# Patient Record
Sex: Female | Born: 1945 | Race: White | Hispanic: No | Smoking: Never smoker
Health system: Southern US, Community
[De-identification: ages and names within clinical notes are randomized; demographics above are authoritative.]

## PROBLEM LIST (undated history)

## (undated) DIAGNOSIS — Z9889 Other specified postprocedural states: Secondary | ICD-10-CM

## (undated) DIAGNOSIS — Z85820 Personal history of malignant melanoma of skin: Secondary | ICD-10-CM

## (undated) DIAGNOSIS — K649 Unspecified hemorrhoids: Secondary | ICD-10-CM

## (undated) DIAGNOSIS — G4730 Sleep apnea, unspecified: Secondary | ICD-10-CM

## (undated) DIAGNOSIS — Z91018 Allergy to other foods: Secondary | ICD-10-CM

## (undated) DIAGNOSIS — K219 Gastro-esophageal reflux disease without esophagitis: Secondary | ICD-10-CM

## (undated) DIAGNOSIS — F419 Anxiety disorder, unspecified: Secondary | ICD-10-CM

## (undated) DIAGNOSIS — Z859 Personal history of malignant neoplasm, unspecified: Secondary | ICD-10-CM

## (undated) DIAGNOSIS — K635 Polyp of colon: Secondary | ICD-10-CM

## (undated) DIAGNOSIS — N390 Urinary tract infection, site not specified: Secondary | ICD-10-CM

## (undated) DIAGNOSIS — Z85828 Personal history of other malignant neoplasm of skin: Secondary | ICD-10-CM

## (undated) DIAGNOSIS — A6920 Lyme disease, unspecified: Secondary | ICD-10-CM

## (undated) DIAGNOSIS — M1990 Unspecified osteoarthritis, unspecified site: Secondary | ICD-10-CM

## (undated) DIAGNOSIS — H269 Unspecified cataract: Secondary | ICD-10-CM

## (undated) DIAGNOSIS — G709 Myoneural disorder, unspecified: Secondary | ICD-10-CM

## (undated) DIAGNOSIS — I10 Essential (primary) hypertension: Secondary | ICD-10-CM

## (undated) DIAGNOSIS — I739 Peripheral vascular disease, unspecified: Secondary | ICD-10-CM

## (undated) DIAGNOSIS — I779 Disorder of arteries and arterioles, unspecified: Secondary | ICD-10-CM

## (undated) DIAGNOSIS — T7840XA Allergy, unspecified, initial encounter: Secondary | ICD-10-CM

## (undated) DIAGNOSIS — IMO0002 Reserved for concepts with insufficient information to code with codable children: Secondary | ICD-10-CM

## (undated) DIAGNOSIS — I471 Supraventricular tachycardia: Secondary | ICD-10-CM

## (undated) DIAGNOSIS — K589 Irritable bowel syndrome without diarrhea: Secondary | ICD-10-CM

## (undated) DIAGNOSIS — C801 Malignant (primary) neoplasm, unspecified: Secondary | ICD-10-CM

## (undated) DIAGNOSIS — N870 Mild cervical dysplasia: Secondary | ICD-10-CM

## (undated) DIAGNOSIS — Z8679 Personal history of other diseases of the circulatory system: Secondary | ICD-10-CM

## (undated) DIAGNOSIS — Z8719 Personal history of other diseases of the digestive system: Secondary | ICD-10-CM

## (undated) HISTORY — PX: COLON SURGERY: SHX602

## (undated) HISTORY — PX: TRANSTHORACIC ECHOCARDIOGRAM: SHX275

## (undated) HISTORY — PX: TUBAL LIGATION: SHX77

## (undated) HISTORY — PX: CATARACT EXTRACTION, BILATERAL: SHX1313

## (undated) HISTORY — PX: COLONOSCOPY: SHX174

## (undated) HISTORY — PX: EYE SURGERY: SHX253

## (undated) HISTORY — DX: Allergy to other foods: Z91.018

## (undated) HISTORY — PX: APPENDECTOMY: SHX54

## (undated) HISTORY — PX: UPPER GI ENDOSCOPY: SHX6162

## (undated) HISTORY — PX: COLPOSCOPY: SHX161

## (undated) HISTORY — DX: Mild cervical dysplasia: N87.0

## (undated) HISTORY — PX: CERVICAL BIOPSY  W/ LOOP ELECTRODE EXCISION: SUR135

## (undated) HISTORY — PX: OTHER SURGICAL HISTORY: SHX169

## (undated) HISTORY — DX: Allergy, unspecified, initial encounter: T78.40XA

## (undated) HISTORY — DX: Myoneural disorder, unspecified: G70.9

## (undated) HISTORY — PX: LAPAROSCOPIC CHOLECYSTECTOMY: SUR755

## (undated) HISTORY — DX: Gastro-esophageal reflux disease without esophagitis: K21.9

## (undated) HISTORY — DX: Polyp of colon: K63.5

---

## 1994-04-01 HISTORY — PX: CARDIAC ELECTROPHYSIOLOGY STUDY AND ABLATION: SHX1294

## 1995-04-02 HISTORY — PX: CARDIAC ELECTROPHYSIOLOGY STUDY AND ABLATION: SHX1294

## 1997-04-01 HISTORY — PX: BREAST SURGERY: SHX581

## 1998-12-19 ENCOUNTER — Ambulatory Visit (HOSPITAL_BASED_OUTPATIENT_CLINIC_OR_DEPARTMENT_OTHER): Admission: RE | Admit: 1998-12-19 | Discharge: 1998-12-19 | Payer: Self-pay

## 1998-12-20 ENCOUNTER — Other Ambulatory Visit: Admission: RE | Admit: 1998-12-20 | Discharge: 1998-12-20 | Payer: Self-pay | Admitting: Obstetrics and Gynecology

## 1998-12-20 ENCOUNTER — Encounter (INDEPENDENT_AMBULATORY_CARE_PROVIDER_SITE_OTHER): Payer: Self-pay | Admitting: Specialist

## 1999-08-15 ENCOUNTER — Other Ambulatory Visit: Admission: RE | Admit: 1999-08-15 | Discharge: 1999-08-15 | Payer: Self-pay | Admitting: Obstetrics and Gynecology

## 2000-11-27 ENCOUNTER — Other Ambulatory Visit: Admission: RE | Admit: 2000-11-27 | Discharge: 2000-11-27 | Payer: Self-pay | Admitting: Obstetrics and Gynecology

## 2001-12-16 ENCOUNTER — Other Ambulatory Visit: Admission: RE | Admit: 2001-12-16 | Discharge: 2001-12-16 | Payer: Self-pay | Admitting: Obstetrics and Gynecology

## 2003-04-02 DIAGNOSIS — A6920 Lyme disease, unspecified: Secondary | ICD-10-CM

## 2003-04-02 HISTORY — DX: Lyme disease, unspecified: A69.20

## 2003-08-10 ENCOUNTER — Other Ambulatory Visit: Admission: RE | Admit: 2003-08-10 | Discharge: 2003-08-10 | Payer: Self-pay | Admitting: Obstetrics and Gynecology

## 2004-01-31 ENCOUNTER — Other Ambulatory Visit: Admission: RE | Admit: 2004-01-31 | Discharge: 2004-01-31 | Payer: Self-pay | Admitting: Obstetrics and Gynecology

## 2004-08-10 ENCOUNTER — Other Ambulatory Visit: Admission: RE | Admit: 2004-08-10 | Discharge: 2004-08-10 | Payer: Self-pay | Admitting: Obstetrics and Gynecology

## 2005-05-16 ENCOUNTER — Ambulatory Visit (HOSPITAL_COMMUNITY): Admission: EM | Admit: 2005-05-16 | Discharge: 2005-05-17 | Payer: Self-pay | Admitting: Emergency Medicine

## 2005-05-16 ENCOUNTER — Encounter (INDEPENDENT_AMBULATORY_CARE_PROVIDER_SITE_OTHER): Payer: Self-pay | Admitting: Specialist

## 2005-12-11 ENCOUNTER — Other Ambulatory Visit: Admission: RE | Admit: 2005-12-11 | Discharge: 2005-12-11 | Payer: Self-pay | Admitting: Obstetrics and Gynecology

## 2006-09-23 ENCOUNTER — Other Ambulatory Visit: Admission: RE | Admit: 2006-09-23 | Discharge: 2006-09-23 | Payer: Self-pay | Admitting: Obstetrics and Gynecology

## 2007-02-18 ENCOUNTER — Other Ambulatory Visit: Admission: RE | Admit: 2007-02-18 | Discharge: 2007-02-18 | Payer: Self-pay | Admitting: Obstetrics and Gynecology

## 2008-02-24 ENCOUNTER — Ambulatory Visit: Payer: Self-pay | Admitting: Obstetrics and Gynecology

## 2008-02-24 ENCOUNTER — Other Ambulatory Visit: Admission: RE | Admit: 2008-02-24 | Discharge: 2008-02-24 | Payer: Self-pay | Admitting: Obstetrics and Gynecology

## 2008-02-24 ENCOUNTER — Encounter: Payer: Self-pay | Admitting: Obstetrics and Gynecology

## 2008-09-19 ENCOUNTER — Ambulatory Visit: Payer: Self-pay | Admitting: Obstetrics and Gynecology

## 2009-03-15 ENCOUNTER — Other Ambulatory Visit: Admission: RE | Admit: 2009-03-15 | Discharge: 2009-03-15 | Payer: Self-pay | Admitting: Obstetrics and Gynecology

## 2009-03-15 ENCOUNTER — Ambulatory Visit: Payer: Self-pay | Admitting: Obstetrics and Gynecology

## 2009-12-08 ENCOUNTER — Ambulatory Visit: Payer: Self-pay | Admitting: Gynecology

## 2010-01-08 ENCOUNTER — Ambulatory Visit: Payer: Self-pay | Admitting: Obstetrics and Gynecology

## 2010-01-17 ENCOUNTER — Encounter: Admission: RE | Admit: 2010-01-17 | Discharge: 2010-01-17 | Payer: Self-pay | Admitting: Gastroenterology

## 2010-05-29 ENCOUNTER — Other Ambulatory Visit (HOSPITAL_COMMUNITY)
Admission: RE | Admit: 2010-05-29 | Discharge: 2010-05-29 | Disposition: A | Discharge status: Home / Self Care | Payer: PRIVATE HEALTH INSURANCE | Source: Ambulatory Visit | Attending: Obstetrics and Gynecology | Admitting: Obstetrics and Gynecology

## 2010-05-30 ENCOUNTER — Other Ambulatory Visit: Payer: Self-pay | Admitting: Obstetrics and Gynecology

## 2010-05-30 ENCOUNTER — Encounter (INDEPENDENT_AMBULATORY_CARE_PROVIDER_SITE_OTHER): Payer: PRIVATE HEALTH INSURANCE | Admitting: Obstetrics and Gynecology

## 2010-05-30 DIAGNOSIS — Z01419 Encounter for gynecological examination (general) (routine) without abnormal findings: Secondary | ICD-10-CM

## 2010-05-30 DIAGNOSIS — Z124 Encounter for screening for malignant neoplasm of cervix: Secondary | ICD-10-CM | POA: Insufficient documentation

## 2010-05-30 DIAGNOSIS — R82998 Other abnormal findings in urine: Secondary | ICD-10-CM

## 2010-05-30 DIAGNOSIS — Z13220 Encounter for screening for lipoid disorders: Secondary | ICD-10-CM

## 2010-05-30 DIAGNOSIS — Z833 Family history of diabetes mellitus: Secondary | ICD-10-CM

## 2010-08-17 NOTE — Op Note (Signed)
Anna [name], Anna [name]                  ACCOUNT NO.:  [id number]   MEDICAL RECORD NO.:  [id number]          PATIENT TYPE:  INP   LOCATION:  0098                         FACILITY:  [facility]   PHYSICIAN:  Clovis Pu. Cornett, M.D.DATE OF BIRTH:  15-Jan-1946   DATE OF PROCEDURE:  05/16/2005  DATE OF DISCHARGE:                                 OPERATIVE REPORT   PREOPERATIVE DIAGNOSIS:  Acute cholecystitis.   POSTOPERATIVE DIAGNOSIS:  Acute cholecystitis.   PROCEDURE:  Laparoscopic cholecystectomy with intraoperative cholangiogram.   SURGEON:  Dr. Harriette Bouillon.   ASSISTANT:  Dr. Cyndia Bent.   ANESTHESIA:  General endotracheal anesthesia with 8 mL of 0.25% Sensorcaine.   ESTIMATED BLOOD LOSS:  20 mL.   SPECIMEN:  Gallbladder to pathology for evaluation with gallstones.   INDICATIONS FOR PROCEDURE:  The patient is a 65 year old female progressive  right upper quadrant pain. She had swelling of her gallbladder and stones  noted by CT scan workup by her gynecologist. She was sent to me in the  emergency room to evaluate I felt she had acute cholecystitis by her  physical examination, clinical history and by reviewing her CT report. After  discussion of the procedure which included laparoscopic cholecystectomy with  intraoperative cholangiogram, she agreed to proceed. Informed consent was  obtained for discussion of the potential complications of the procedure.   DESCRIPTION OF PROCEDURE:  The patient was brought to the operating room and  placed supine. After induction of general endotracheal anesthesia, the  abdomen was prepped and draped in a sterile fashion. A 1 cm supraumbilical  incision was made, dissection was carried to her fascia and her fascia was  grasped with a Kocher clamp. A small incision was made and both sides of the  fascia were grasped in between Kocher's. I used my finger to enter the  abdominal cavity by pushing through the peritoneum. A pursestring suture of  #0  Vicryl was placed and an 11-mm Hassan cannula was placed under direct  vision. Pneumoperitoneum was created to 15 mmHg with CO2 and a laparoscope  was placed. The patient was placed in reverse Trendelenburg and rolled to  her left. The gallbladder was identified and found to be acutely inflamed.  The appendix was identified and found to be normal. Next, a 5 mm subxiphoid  port was placed under direct vision. Two 5 mm ports were placed in the right  mid abdomen under direct vision. The gallbladder was grasped by its dome and  retracted toward the patient's right shoulder. A second grasper was used to  grasp the infundibulum. I began my dissection at the junction the cystic  duct and cystic artery and dissected out the cystic artery circumferentially  as the only tubular structuring entering the gallbladder. A clip was placed  on the gallbladder side of the cystic duct and a small incision was made for  intraoperative cholangiogram. Through a separate stab incision, a Cook  cholangiogram catheter was placed. This was placed in the cystic duct and  secured with a clip. Intraoperative cholangiogram was performed using one-  half strength Hypaque  dye. Fluoroscopy was used. The cystic duct opacified  with free flow of contrast down the common duct into the duodenum. There was  free flow of contrast up the common hepatic duct to its bifurcation and  subsegments. No evidence of stone, stricture or extravasation of contrast.  At this point, the cholangiogram was complete. The cystic duct was double  clipped and divided. The cystic artery was identified and this was double  clipped and divided. There was a small posterior branch at the cystic artery  that was double clipped and divided as well. Hook cautery was used to  dissect the gallbladder from the gallbladder fossa. A small hole was made in  the gallbladder but no stones were spilled. The remainder of the dissection  went without difficulty.  Using a 5 mm camera, I extracted the gallbladder  with an EndoCatch bag through the umbilicus. We then inspected the bed and  found it to be hemostatic. There was two small areas of oozing that I  controlled with cautery. Irrigation was used and suctioned out. Hemostasis  at this point is excellent. The ports were subsequently withdrawn under  direct vision with no signs of bowel injury or liver or other intra-  abdominal organ injury. Prior to extracting the camera, I examined the  cystic duct as well as cystic artery stumps and these were found be  hemostatic with no leakage of bile. The ports were subsequently removed  under direct vision with no port site bleeding. The camera was withdrawn and  the umbilical port was withdrawn as well and the CO2 was released. I closed  the umbilical port site with a pursestring suture of #0 Vicryl, 4-0 Monocryl  was used to close the skin. Dry dressings were applied to all incisions  after removal of ports. All final counts of sponge, needle and instruments  were found to be correct at this portion of the case. The patient was then  awoke and taken to recovery in satisfactory condition.      Thomas A. Cornett, M.D.  Electronically Signed     TAC/MEDQ  D:  05/16/2005  T:  05/17/2005  Job:  [id number]

## 2010-08-17 NOTE — H&P (Signed)
Anna [name], Anna [name]                  ACCOUNT NO.:  [id number]   MEDICAL RECORD NO.:  [id number]          PATIENT TYPE:  INP   LOCATION:  0098                         FACILITY:  [facility]   PHYSICIAN:  Clovis Pu. Cornett, M.D.DATE OF BIRTH:  05-19-1945   DATE OF ADMISSION:  05/16/2005  DATE OF DISCHARGE:                                HISTORY & PHYSICAL   CHIEF COMPLAINT:  Abdominal pain.   HISTORY OF PRESENT ILLNESS:  The patient is a 65 year old female sent to the  emergency room today due to right upper quadrant abdominal pain. I was asked  to see her at the request of her gynecologist today. She complains of severe  right upper quadrant pain radiating to her right shoulder. This started this  morning about 2 o'clock and has been constant. It has not gotten better and  is made worse by laying flat. The pain is severe in nature. It is a sharp  pain. Denies any shortness of breath. Denies any chest pain. No burning when  she urinates or nausea and vomiting.   PAST MEDICAL HISTORY:  1.  History of SVT, status post ablation in 1996.  2.  Anxiety.   PAST SURGICAL HISTORY:  Tubal ligation, laparoscopic.   SOCIAL HISTORY:  Denies tobacco or smoking use.   ALLERGIES:  SULFA and a Z-PAK.   MEDICATIONS:  1.  Wellbutrin 300 milligrams once a day.  2.  Lexapro 10 milligrams a day.  3.  Xanax 1 milligram twice a day.   REVIEW OF SYSTEMS:  As stated above, otherwise 10-review of systems is  negative.   FAMILY HISTORY:  Noncontributory.   PHYSICAL EXAMINATION:  VITAL SIGNS: Temperature is 98.4, pulse 85,  respiratory rate 18.  GENERAL APPEARANCE: A pleasant female in no apparent distress.  HEENT: No scleral icterus. Oropharynx clear. Moist mucus membranes.  NECK: Supple and nontender.  CHEST: Clear to auscultation bilaterally.  CARDIOVASCULAR: Regular rate and rhythm without murmur, rub, or gallop.  ABDOMEN: Positive rebound and guarding over the right upper quadrant.  Positive Murphy's  sign. No mass.  EXTREMITIES: No edema.  NEUROLOGICAL: Motor and sensory function are grossly intact. Gait is normal.  PSYCHIATRIC: Mood is normal and pleasant.   DIAGNOSTIC STUDIES:  CT scan reveals thickening of the gallbladder with  cholelithiasis. She has a white count of 9000, hemoglobin 12.6, platelet  count is 229,000. EKG and CMET is pending.   IMPRESSION:  Acute cholecystitis.   PLAN:  I recommend a laparoscopic cholecystectomy with intraoperative  cholangiogram this evening. We will go ahead and try to proceed with this  since she is in quite a bit of pain. I have discussed the procedure as well  as the potential complications of its long-term outcome and risk with the  patient. She voices understanding and agrees to proceed.      Thomas A. Cornett, M.D.  Electronically Signed     TAC/MEDQ  D:  05/16/2005  T:  05/17/2005  Job:  [id number]

## 2010-11-01 ENCOUNTER — Other Ambulatory Visit: Payer: Self-pay | Admitting: Obstetrics and Gynecology

## 2010-11-01 DIAGNOSIS — F419 Anxiety disorder, unspecified: Secondary | ICD-10-CM

## 2010-11-01 NOTE — Telephone Encounter (Signed)
PT. WENT TO [place] June [id number] & REFILLED XANAX WHILE THERE & CAN NOT TRANSFER THE ADDITIONAL REFILLS BACK TO HER [facility] HERE. I CONFIRMED THIS WITH CHRISTIAN @ HER [facility] HERE. CAN SHE GET MORE REFILLS CALLED TO THEM?

## 2010-11-01 NOTE — Telephone Encounter (Signed)
PT. NOTIFIED RX REFILLED PER DR. GOTTSEGEN & I CALLED IN RX.

## 2010-11-26 ENCOUNTER — Encounter: Payer: Self-pay | Admitting: Obstetrics and Gynecology

## 2010-11-26 ENCOUNTER — Ambulatory Visit (INDEPENDENT_AMBULATORY_CARE_PROVIDER_SITE_OTHER): Payer: PRIVATE HEALTH INSURANCE | Admitting: Obstetrics and Gynecology

## 2010-11-26 DIAGNOSIS — N950 Postmenopausal bleeding: Secondary | ICD-10-CM

## 2010-11-26 NOTE — Progress Notes (Signed)
The patient came to see me with a 2 day history of vaginal spotting and pelvic cramping. She is no longer on HRT. This has never happened to her before.  Abdomen is soft without guarding rebound or masses. External: Within normal limits, BUS: Within normal limits ,vaginal exam: Within normal limits,cervix: Clean but blood coming out of the os. Uterus: Normal size and shape. Adnexa: No masses.  Assessment: Postmenopausal bleeding  Plan: Endometrial biopsy done. SIH scheduled.

## 2010-12-05 ENCOUNTER — Telehealth: Payer: Self-pay

## 2010-12-05 NOTE — Telephone Encounter (Signed)
yes

## 2010-12-05 NOTE — Telephone Encounter (Signed)
DOES SHE STILL KEEP APPT FOR SONOHYSTOGRAM OF 12/12/10 AFTER SHE GETS THE BIOPSY RESULTS FROM YOU?

## 2010-12-06 ENCOUNTER — Telehealth: Payer: Self-pay

## 2010-12-06 NOTE — Telephone Encounter (Signed)
A CT of the abdomen and the pelvis was not do the same information as an SIH. I doubt yeast infections cause the bleeding. A CA 125 is not recommended unless a mass is seen on ultrasound.

## 2010-12-06 NOTE — Telephone Encounter (Signed)
PT. HAS A VERY HIGH DEDUCTIBLE AND WILL HAVE TO PAY OUT-OF-POCKET FOR 12-12-10 SHGM. HAD CT OF ABD. & PELVIS WITH DR. Madilyn Fireman 01-17-10. IT IS IN EPIC UNDER IMAGING TAB. WANTS TO KNOW IF ALL WAS FINE THEN DOES SHE STILL NEED THE SHGM NOW? ALSO SHE WANTS TO KNOW IF VAGINAL BLEEDING COULD BE FROM PERSISTENT RECURRENT YEAST INFECTIONS? ALSO SOMEONE TOLD HER ABOUT GETTING A CA125 TEST. IS THIS AN OPTION FOR HER?

## 2010-12-06 NOTE — Telephone Encounter (Signed)
PT. NOTIFIED OF DR. G'S NOTE BELOW AND WILL STILL HAVE SHGM DONE.

## 2010-12-12 ENCOUNTER — Other Ambulatory Visit: Payer: PRIVATE HEALTH INSURANCE

## 2010-12-12 ENCOUNTER — Ambulatory Visit (INDEPENDENT_AMBULATORY_CARE_PROVIDER_SITE_OTHER): Payer: PRIVATE HEALTH INSURANCE | Admitting: Obstetrics and Gynecology

## 2010-12-12 DIAGNOSIS — N949 Unspecified condition associated with female genital organs and menstrual cycle: Secondary | ICD-10-CM

## 2010-12-12 DIAGNOSIS — N950 Postmenopausal bleeding: Secondary | ICD-10-CM

## 2010-12-12 DIAGNOSIS — N83339 Acquired atrophy of ovary and fallopian tube, unspecified side: Secondary | ICD-10-CM

## 2010-12-12 DIAGNOSIS — N840 Polyp of corpus uteri: Secondary | ICD-10-CM

## 2010-12-12 DIAGNOSIS — R1032 Left lower quadrant pain: Secondary | ICD-10-CM

## 2010-12-12 NOTE — Progress Notes (Signed)
The patient had had postmenopausal bleeding for 2 days. It is now stopped she still having menstrual cramping. She had a normal endometrial biopsy at her office last week. On ultrasound today she has a retroflexed uterus with a prominent echogenic endometrial cavity. The echo is 4.7 mm. There is one area in the myometrium it looks like adenomyosis with the cystic area. Both ovaries are atrophic without masses. Cul-de-sac is free of fluid. An SIH was done. There were 2 posterior wall defects off slightly less than 1 cm consistent with endometrial polyps.  Assessment: 1. Postmenopausal bleeding 2. Endometrial polyps 3. Menstrual cramping.  Plan: We discussed proceeding with a hysteroscopy D&C and excision of these polyps. We will do this at [facility]. Patient will call Olegario Messier with dates. I explained to the patient that this may not get rid of the menstrual cramping but probably will.

## 2011-01-24 ENCOUNTER — Telehealth: Payer: Self-pay

## 2011-01-24 NOTE — Telephone Encounter (Signed)
Patient was in mid Sept and Dr. Reece Agar had rec Hysteroscopy, D&C and the note said patient would call me with dates.  I have not heard from her so I called her today.  She said she is doing great. No spotting or bleeding since that last episode.  She said she will have surgery but just too busy now to do it. 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  She said another reason she feels comfortable waiting is Dr. Reece Agar did endo bx and it was normal and no bleeding since.  She said she will call me when she is ready to schedule.  Dr. Reece Agar- Do I need to continue to follow up with her?

## 2011-01-24 NOTE — Telephone Encounter (Signed)
no

## 2011-02-18 ENCOUNTER — Other Ambulatory Visit: Payer: Self-pay | Admitting: Obstetrics and Gynecology

## 2011-02-18 NOTE — Telephone Encounter (Signed)
RX CALLED IN

## 2011-03-13 ENCOUNTER — Telehealth: Payer: Self-pay | Admitting: *Deleted

## 2011-03-13 NOTE — Telephone Encounter (Signed)
Please call patient and informed Tamiflu is not to be given prophylactically. Tamiflu is used within 72 hours of symptoms. Encouraged to wash surfaces granddaughter has touched with lysol wipes like door handles, faucets, ect.

## 2011-03-13 NOTE — Telephone Encounter (Signed)
Pt called wanting to know if she could have Rx for Tamaflu. She was around her granddaughter which was recently diagnosed with the flu. She has no pcp, she had OV in sept. Please advise

## 2011-03-14 NOTE — Telephone Encounter (Signed)
Lm for pt to call

## 2011-03-27 NOTE — Telephone Encounter (Signed)
Pt never returned phone call 

## 2011-04-26 ENCOUNTER — Other Ambulatory Visit: Payer: Self-pay | Admitting: Obstetrics and Gynecology

## 2011-04-26 NOTE — Telephone Encounter (Signed)
Amy Rx was called in.

## 2011-05-24 ENCOUNTER — Other Ambulatory Visit: Payer: Self-pay | Admitting: *Deleted

## 2011-05-24 NOTE — Telephone Encounter (Signed)
give patient #30 with 2 refills.

## 2011-05-24 NOTE — Telephone Encounter (Signed)
Rx called in to pharmacy. 

## 2011-05-24 NOTE — Telephone Encounter (Signed)
Pt called requesting refill on xanax 0.5 mg 1 tablet by mouth twice daily as needed. Pt asked if she could as have a couple of refills. Please advise

## 2011-08-16 ENCOUNTER — Other Ambulatory Visit: Payer: Self-pay | Admitting: Obstetrics and Gynecology

## 2011-08-22 ENCOUNTER — Other Ambulatory Visit (HOSPITAL_COMMUNITY)
Admission: RE | Admit: 2011-08-22 | Discharge: 2011-08-22 | Disposition: A | Discharge status: Home / Self Care | Payer: No Typology Code available for payment source | Source: Ambulatory Visit | Attending: Obstetrics and Gynecology | Admitting: Obstetrics and Gynecology

## 2011-08-22 ENCOUNTER — Ambulatory Visit (INDEPENDENT_AMBULATORY_CARE_PROVIDER_SITE_OTHER): Payer: PRIVATE HEALTH INSURANCE | Admitting: Obstetrics and Gynecology

## 2011-08-22 ENCOUNTER — Encounter: Payer: Self-pay | Admitting: Obstetrics and Gynecology

## 2011-08-22 VITALS — BP 124/76 | Ht 67.5 in | Wt 183.0 lb

## 2011-08-22 DIAGNOSIS — C439 Malignant melanoma of skin, unspecified: Secondary | ICD-10-CM | POA: Insufficient documentation

## 2011-08-22 DIAGNOSIS — E559 Vitamin D deficiency, unspecified: Secondary | ICD-10-CM

## 2011-08-22 DIAGNOSIS — E7800 Pure hypercholesterolemia, unspecified: Secondary | ICD-10-CM

## 2011-08-22 DIAGNOSIS — Z01419 Encounter for gynecological examination (general) (routine) without abnormal findings: Secondary | ICD-10-CM

## 2011-08-22 DIAGNOSIS — Z1159 Encounter for screening for other viral diseases: Secondary | ICD-10-CM | POA: Insufficient documentation

## 2011-08-22 LAB — COMPREHENSIVE METABOLIC PANEL
ALT: 13 U/L (ref 0–35)
AST: 15 U/L (ref 0–37)
Albumin: 4.2 g/dL (ref 3.5–5.2)
Alkaline Phosphatase: 76 U/L (ref 39–117)
BUN: 15 mg/dL (ref 6–23)
CO2: 28 mEq/L (ref 19–32)
Calcium: 9.2 mg/dL (ref 8.4–10.5)
Chloride: 106 mEq/L (ref 96–112)
Creat: 0.83 mg/dL (ref 0.50–1.10)
Glucose, Bld: 78 mg/dL (ref 70–99)
Potassium: 4 mEq/L (ref 3.5–5.3)
Sodium: 141 mEq/L (ref 135–145)
Total Bilirubin: 0.4 mg/dL (ref 0.3–1.2)
Total Protein: 6.8 g/dL (ref 6.0–8.3)

## 2011-08-22 LAB — CBC WITH DIFFERENTIAL/PLATELET
Basophils Absolute: 0 10*3/uL (ref 0.0–0.1)
Basophils Relative: 0 % (ref 0–1)
Eosinophils Absolute: 0.2 10*3/uL (ref 0.0–0.7)
Eosinophils Relative: 2 % (ref 0–5)
HCT: 41.4 % (ref 36.0–46.0)
Hemoglobin: 13.7 g/dL (ref 12.0–15.0)
Lymphocytes Relative: 40 % (ref 12–46)
Lymphs Abs: 3.1 10*3/uL (ref 0.7–4.0)
MCH: 31 pg (ref 26.0–34.0)
MCHC: 33.1 g/dL (ref 30.0–36.0)
MCV: 93.7 fL (ref 78.0–100.0)
Monocytes Absolute: 0.3 10*3/uL (ref 0.1–1.0)
Monocytes Relative: 5 % (ref 3–12)
Neutro Abs: 4 10*3/uL (ref 1.7–7.7)
Neutrophils Relative %: 53 % (ref 43–77)
Platelets: 310 10*3/uL (ref 150–400)
RBC: 4.42 MIL/uL (ref 3.87–5.11)
RDW: 13.7 % (ref 11.5–15.5)
WBC: 7.6 10*3/uL (ref 4.0–10.5)

## 2011-08-22 LAB — LIPID PANEL
Cholesterol: 232 mg/dL — ABNORMAL HIGH (ref 0–200)
HDL: 54 mg/dL (ref >39)
LDL Cholesterol: 155 mg/dL — ABNORMAL HIGH (ref 0–99)
Total CHOL/HDL Ratio: 4.3 Ratio
Triglycerides: 114 mg/dL (ref <150)
VLDL: 23 mg/dL (ref 0–40)

## 2011-08-22 MED ORDER — ALPRAZOLAM 0.5 MG PO TABS: 0.5000 mg | ORAL_TABLET | Freq: Every evening | ORAL | Status: DC | PRN

## 2011-08-22 NOTE — Progress Notes (Signed)
Patient came back to see me today for her annual GYN exam. 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 She is having no vaginal bleeding. She is having no pelvic pain. She is not sexually active. She is due for her yearly mammogram. Patient has had 2 normal bone densities. When we last checked her lipid profile last year her total cholesterol was 273 with a triglyceride of 163 an LDL of 178. She has been working on her diet. We had asked her last year to see a cardiologist but she is so far not seen one. She did fast  Today.  Physical examination: Kennon Portela present. HEENT within normal limits. Neck: Thyroid not large. No masses. Supraclavicular nodes: not enlarged. Breasts: Examined in both sitting and lying  position. No skin changes and no masses. Abdomen: Soft no guarding rebound or masses or hernia. Pelvic: External: Within normal limits. BUS: Within normal limits. Vaginal:within normal limits. Good estrogen effect. No evidence of cystocele rectocele or enterocele. Cervix: clean. Uterus: Normal size and shape. Adnexa: No masses. Rectovaginal exam: Confirmatory and negative. Extremities: Within normal limits.  Assessment: #1. Elevated cholesterol #2. CIN status post leap in 2005  Plan: Lipid profile done. Pap with high risk HPV typing done. Hopefully high-risk HPV will be negative and we will screen  one more year and then go back to normal screening. Continue yearly mammograms.

## 2011-08-23 LAB — URINALYSIS W MICROSCOPIC + REFLEX CULTURE
Bacteria, UA: NONE SEEN
Bilirubin Urine: NEGATIVE
Casts: NONE SEEN
Crystals: NONE SEEN
Glucose, UA: NEGATIVE mg/dL
Hgb urine dipstick: NEGATIVE
Ketones, ur: NEGATIVE mg/dL
Nitrite: NEGATIVE
Protein, ur: NEGATIVE mg/dL
Specific Gravity, Urine: 1.02 (ref 1.005–1.030)
Urobilinogen, UA: 0.2 mg/dL (ref 0.0–1.0)
pH: 5 (ref 5.0–8.0)

## 2011-08-23 LAB — VITAMIN D 25 HYDROXY (VIT D DEFICIENCY, FRACTURES): Vit D, 25-Hydroxy: 28 ng/mL — ABNORMAL LOW (ref 30–89)

## 2011-08-23 NOTE — Progress Notes (Signed)
Addended by: Richardson Chiquito on: 08/23/2011 12:20 PM   Modules accepted: Orders

## 2011-08-24 LAB — URINE CULTURE
Colony Count: NO GROWTH
Organism ID, Bacteria: NO GROWTH

## 2011-12-04 ENCOUNTER — Ambulatory Visit: Payer: Self-pay | Admitting: Unknown Physician Specialty

## 2012-01-23 ENCOUNTER — Telehealth: Payer: Self-pay | Admitting: *Deleted

## 2012-01-23 NOTE — Telephone Encounter (Signed)
Follow up from 01/24/11 telephone encounter. Pt is ready to schedule surgery if you think she should still have it? Pt is not having any problems doing good, no bleeding or spotting at all. Please advise

## 2012-01-23 NOTE — Telephone Encounter (Signed)
Is patient having any bleeding? Even if she is not I think she should have the surgery. The diagnosis is endometrial polyps. The surgery is hysteroscopy, D&C. Please forward to [facility] to schedule surgery.

## 2012-01-24 NOTE — Telephone Encounter (Signed)
Surgery is scheduled for Th Nov 14 7:30am at [facility].  Patient instructed and pamphlet mailed to her.

## 2012-01-27 ENCOUNTER — Emergency Department: Payer: Self-pay | Admitting: Emergency Medicine

## 2012-01-27 LAB — COMPREHENSIVE METABOLIC PANEL
Albumin: 4.2 g/dL (ref 3.4–5.0)
Alkaline Phosphatase: 106 U/L (ref 50–136)
Anion Gap: 7 (ref 7–16)
BUN: 16 mg/dL (ref 7–18)
Bilirubin,Total: 0.3 mg/dL (ref 0.2–1.0)
Calcium, Total: 9.2 mg/dL (ref 8.5–10.1)
Chloride: 107 mmol/L (ref 98–107)
Co2: 30 mmol/L (ref 21–32)
Creatinine: 0.83 mg/dL (ref 0.60–1.30)
EGFR (African American): >60
EGFR (Non-African Amer.): >60
Glucose: 86 mg/dL (ref 65–99)
Osmolality: 287 (ref 275–301)
Potassium: 4.4 mmol/L (ref 3.5–5.1)
SGOT(AST): 25 U/L (ref 15–37)
SGPT (ALT): 32 U/L (ref 12–78)
Sodium: 144 mmol/L (ref 136–145)
Total Protein: 8.3 g/dL — ABNORMAL HIGH (ref 6.4–8.2)

## 2012-01-27 LAB — CBC
HCT: 43.3 % (ref 35.0–47.0)
HGB: 14.5 g/dL (ref 12.0–16.0)
MCH: 32.1 pg (ref 26.0–34.0)
MCHC: 33.6 g/dL (ref 32.0–36.0)
MCV: 96 fL (ref 80–100)
Platelet: 303 10*3/uL (ref 150–440)
RBC: 4.52 10*6/uL (ref 3.80–5.20)
RDW: 13.3 % (ref 11.5–14.5)
WBC: 7.9 10*3/uL (ref 3.6–11.0)

## 2012-01-27 LAB — URINALYSIS, COMPLETE
Bacteria: NONE SEEN
Bilirubin,UR: NEGATIVE
Blood: NEGATIVE
Glucose,UR: NEGATIVE mg/dL (ref 0–75)
Ketone: NEGATIVE
Nitrite: NEGATIVE
Ph: 5 (ref 4.5–8.0)
Protein: NEGATIVE
RBC,UR: <1 /HPF (ref 0–5)
Specific Gravity: 1.008 (ref 1.003–1.030)
Squamous Epithelial: <1
WBC UR: 4 /HPF (ref 0–5)

## 2012-02-07 ENCOUNTER — Encounter (HOSPITAL_BASED_OUTPATIENT_CLINIC_OR_DEPARTMENT_OTHER): Payer: Self-pay | Admitting: *Deleted

## 2012-02-07 NOTE — Progress Notes (Signed)
NPO AFTER MN. ARRIVES AT 0615. PT TO GET LAB WORK DONE Monday OR Tuesday BEFORE 1430. (02-10-2012 OR 02-11-2012). NEEDS EKG.

## 2012-02-10 LAB — CBC
HCT: 39.2 % (ref 36.0–46.0)
Hemoglobin: 13.6 g/dL (ref 12.0–15.0)
MCH: 32.6 pg (ref 26.0–34.0)
MCHC: 34.7 g/dL (ref 30.0–36.0)
MCV: 94 fL (ref 78.0–100.0)
Platelets: 303 10*3/uL (ref 150–400)
RBC: 4.17 MIL/uL (ref 3.87–5.11)
RDW: 12.9 % (ref 11.5–15.5)
WBC: 6.7 10*3/uL (ref 4.0–10.5)

## 2012-02-12 NOTE — H&P (Signed)
Chief complaint: Postmenopausal bleeding with endometrial polyps.  History of present illness: Patient is a 66 year old gravida 2 para 2 AB 0 who presented to the office with postmenopausal bleeding. Saline infusion histogram showed several endometrial polyps. Patient now enters the hospital for hysteroscopy and excision of endometrial polyps.  Past medical history, family history, social history, and review of systems in epic record and reviewed.  Physical examination: HEENT within normal limits. Neck: Thyroid not large. No masses. Supraclavicular nodes: not enlarged. Breasts: Examined in both sitting and lying  position. No skin changes and no masses. Abdomen: Soft no guarding rebound or masses or hernia. Pelvic: External: Within normal limits. BUS: Within normal limits. Vaginal:within normal limits. Good estrogen effect. No evidence of cystocele rectocele or enterocele. Cervix: clean. Uterus: Normal size and shape. Adnexa: No masses. Rectovaginal exam: Confirmatory and negative. Extremities: Within normal limits.  Assessment: #1. Endometrial polyps #2. Postmenopausal bleeding  Plan: Hysteroscopy, D&C

## 2012-02-12 NOTE — Anesthesia Preprocedure Evaluation (Addendum)
Anesthesia Evaluation  Patient identified by MRN, date of birth, ID band Patient awake    Reviewed: Allergy & Precautions, H&P , NPO status , Patient's Chart, lab work & pertinent test results, reviewed documented beta blocker date and time   Airway Mallampati: II TM Distance: >3 FB Neck ROM: Full    Dental No notable dental hx.    Pulmonary neg pulmonary ROS,  breath sounds clear to auscultation  Pulmonary exam normal       Cardiovascular Exercise Tolerance: Good negative cardio ROS  Rhythm:Regular Rate:Normal  H/O WPW s/p successful ablation in 1997   Neuro/Psych Anxiety negative neurological ROS  negative psych ROS   GI/Hepatic negative GI ROS, Neg liver ROS,   Endo/Other  negative endocrine ROS  Renal/GU negative Renal ROS  negative genitourinary   Musculoskeletal negative musculoskeletal ROS (+)   Abdominal   Peds negative pediatric ROS (+)  Hematology negative hematology ROS (+)   Anesthesia Other Findings   Reproductive/Obstetrics negative OB ROS                          Anesthesia Physical Anesthesia Plan  ASA: II  Anesthesia Plan: General   Post-op Pain Management:    Induction: Intravenous  Airway Management Planned: LMA  Additional Equipment:   Intra-op Plan:   Post-operative Plan: Extubation in OR  Informed Consent: I have reviewed the patients History and Physical, chart, labs and discussed the procedure including the risks, benefits and alternatives for the proposed anesthesia with the patient or authorized representative who has indicated his/her understanding and acceptance.   Dental advisory given  Plan Discussed with: CRNA  Anesthesia Plan Comments:         Anesthesia Quick Evaluation

## 2012-02-13 ENCOUNTER — Ambulatory Visit (HOSPITAL_BASED_OUTPATIENT_CLINIC_OR_DEPARTMENT_OTHER)
Admission: RE | Admit: 2012-02-13 | Discharge: 2012-02-13 | Disposition: A | Discharge status: Home / Self Care | Payer: Medicare Other | Source: Ambulatory Visit | Attending: Obstetrics and Gynecology | Admitting: Obstetrics and Gynecology

## 2012-02-13 ENCOUNTER — Encounter (HOSPITAL_BASED_OUTPATIENT_CLINIC_OR_DEPARTMENT_OTHER): Payer: Self-pay | Admitting: Anesthesiology

## 2012-02-13 ENCOUNTER — Encounter (HOSPITAL_BASED_OUTPATIENT_CLINIC_OR_DEPARTMENT_OTHER): Payer: Self-pay | Admitting: *Deleted

## 2012-02-13 ENCOUNTER — Ambulatory Visit (HOSPITAL_BASED_OUTPATIENT_CLINIC_OR_DEPARTMENT_OTHER): Payer: Medicare Other | Admitting: Anesthesiology

## 2012-02-13 ENCOUNTER — Encounter (HOSPITAL_BASED_OUTPATIENT_CLINIC_OR_DEPARTMENT_OTHER)
Admission: RE | Disposition: A | Discharge status: Home / Self Care | Payer: Self-pay | Source: Ambulatory Visit | Attending: Obstetrics and Gynecology

## 2012-02-13 DIAGNOSIS — N814 Uterovaginal prolapse, unspecified: Secondary | ICD-10-CM

## 2012-02-13 DIAGNOSIS — N950 Postmenopausal bleeding: Secondary | ICD-10-CM

## 2012-02-13 DIAGNOSIS — N840 Polyp of corpus uteri: Secondary | ICD-10-CM | POA: Insufficient documentation

## 2012-02-13 DIAGNOSIS — Z01812 Encounter for preprocedural laboratory examination: Secondary | ICD-10-CM | POA: Insufficient documentation

## 2012-02-13 HISTORY — PX: HYSTEROSCOPY WITH D & C: SHX1775

## 2012-02-13 HISTORY — DX: Anxiety disorder, unspecified: F41.9

## 2012-02-13 HISTORY — DX: Personal history of malignant melanoma of skin: Z98.890

## 2012-02-13 HISTORY — DX: Personal history of malignant melanoma of skin: Z85.820

## 2012-02-13 LAB — CBC
HCT: 36.7 % (ref 36.0–46.0)
Hemoglobin: 12.6 g/dL (ref 12.0–15.0)
MCH: 32.5 pg (ref 26.0–34.0)
MCHC: 34.3 g/dL (ref 30.0–36.0)
MCV: 94.6 fL (ref 78.0–100.0)
Platelets: 282 10*3/uL (ref 150–400)
RBC: 3.88 MIL/uL (ref 3.87–5.11)
RDW: 12.9 % (ref 11.5–15.5)
WBC: 7.3 10*3/uL (ref 4.0–10.5)

## 2012-02-13 SURGERY — DILATATION AND CURETTAGE /HYSTEROSCOPY
Anesthesia: General | Site: Uterus | Wound class: Clean Contaminated

## 2012-02-13 MED ORDER — ONDANSETRON HCL 4 MG/2ML IJ SOLN
INTRAMUSCULAR | Status: DC | PRN
  Administered 2012-02-13: 4 mg via INTRAVENOUS

## 2012-02-13 MED ORDER — LIDOCAINE HCL (CARDIAC) 20 MG/ML IV SOLN
INTRAVENOUS | Status: DC | PRN
  Administered 2012-02-13: 80 mg via INTRAVENOUS

## 2012-02-13 MED ORDER — GLYCINE 1.5 % IR SOLN
Status: DC | PRN
  Administered 2012-02-13: 3000 mL

## 2012-02-13 MED ORDER — MIDAZOLAM HCL 5 MG/5ML IJ SOLN
INTRAMUSCULAR | Status: DC | PRN
  Administered 2012-02-13: 2 mg via INTRAVENOUS

## 2012-02-13 MED ORDER — PROPOFOL 10 MG/ML IV BOLUS
INTRAVENOUS | Status: DC | PRN
  Administered 2012-02-13: 180 mg via INTRAVENOUS

## 2012-02-13 MED ORDER — FENTANYL CITRATE 0.05 MG/ML IJ SOLN
INTRAMUSCULAR | Status: DC | PRN
  Administered 2012-02-13 (×2): 50 ug via INTRAVENOUS

## 2012-02-13 MED ORDER — KETOROLAC TROMETHAMINE 30 MG/ML IJ SOLN
INTRAMUSCULAR | Status: DC | PRN
  Administered 2012-02-13: 30 mg via INTRAVENOUS

## 2012-02-13 MED ORDER — LACTATED RINGERS IV SOLN
INTRAVENOUS | Status: DC
  Administered 2012-02-13: 07:00:00 via INTRAVENOUS
  Filled 2012-02-13: qty 1000

## 2012-02-13 MED ORDER — FENTANYL CITRATE 0.05 MG/ML IJ SOLN
25.0000 ug | INTRAMUSCULAR | Status: DC | PRN
  Filled 2012-02-13: qty 1

## 2012-02-13 MED ORDER — PROMETHAZINE HCL 25 MG/ML IJ SOLN
6.2500 mg | INTRAMUSCULAR | Status: DC | PRN
  Filled 2012-02-13: qty 1

## 2012-02-13 MED ORDER — DEXAMETHASONE SODIUM PHOSPHATE 4 MG/ML IJ SOLN
INTRAMUSCULAR | Status: DC | PRN
  Administered 2012-02-13: 10 mg via INTRAVENOUS

## 2012-02-13 MED ORDER — ACETAMINOPHEN 10 MG/ML IV SOLN
INTRAVENOUS | Status: DC | PRN
  Administered 2012-02-13: 1000 mg via INTRAVENOUS

## 2012-02-13 MED ORDER — CEFAZOLIN SODIUM-DEXTROSE 2-3 GM-% IV SOLR
2.0000 g | INTRAVENOUS | Status: AC
  Administered 2012-02-13: 2 g via INTRAVENOUS
  Filled 2012-02-13: qty 50

## 2012-02-13 SURGICAL SUPPLY — 28 items
CANISTER SUCTION 2500CC (MISCELLANEOUS) ×2 IMPLANT
CATH ROBINSON RED A/P 16FR (CATHETERS) IMPLANT
CLOTH BEACON ORANGE TIMEOUT ST (SAFETY) ×2 IMPLANT
CORD ACTIVE DISPOSABLE (ELECTRODE) ×1
CORD ELECTRO ACTIVE DISP (ELECTRODE) ×1 IMPLANT
COVER TABLE BACK 60X90 (DRAPES) ×2 IMPLANT
DRAPE CAMERA CLOSED 9X96 (DRAPES) ×2 IMPLANT
DRAPE LG THREE QUARTER DISP (DRAPES) ×2 IMPLANT
DRESSING TELFA 8X3 (GAUZE/BANDAGES/DRESSINGS) ×2 IMPLANT
ELECT LOOP GYNE PRO 24FR (CUTTING LOOP) ×2
ELECT REM PT RETURN 9FT ADLT (ELECTROSURGICAL) ×2
ELECT VAPORTRODE GRVD BAR (ELECTRODE) IMPLANT
ELECTRODE LOOP GYNE PRO 24FR (CUTTING LOOP) ×1 IMPLANT
ELECTRODE REM PT RTRN 9FT ADLT (ELECTROSURGICAL) ×1 IMPLANT
GLOVE BIO SURGEON STRL SZ 6.5 (GLOVE) ×2 IMPLANT
GLOVE ECLIPSE 6.0 STRL STRAW (GLOVE) ×1 IMPLANT
GLOVE ECLIPSE 7.0 STRL STRAW (GLOVE) ×4 IMPLANT
GLOVE INDICATOR 7.5 STRL GRN (GLOVE) ×2 IMPLANT
GOWN PREVENTION PLUS LG XLONG (DISPOSABLE) ×2 IMPLANT
GOWN STRL NON-REIN LRG LVL3 (GOWN DISPOSABLE) ×2 IMPLANT
LEGGING LITHOTOMY PAIR STRL (DRAPES) ×2 IMPLANT
PACK BASIN DAY SURGERY FS (CUSTOM PROCEDURE TRAY) ×2 IMPLANT
PAD OB MATERNITY 4.3X12.25 (PERSONAL CARE ITEMS) ×2 IMPLANT
PAD PREP 24X48 CUFFED NSTRL (MISCELLANEOUS) ×2 IMPLANT
TOWEL OR 17X24 6PK STRL BLUE (TOWEL DISPOSABLE) ×4 IMPLANT
TRAY DSU PREP LF (CUSTOM PROCEDURE TRAY) ×2 IMPLANT
TUBING HYDROFLEX HYSTEROSCOPY (TUBING) ×2 IMPLANT
WATER STERILE IRR 500ML POUR (IV SOLUTION) ×2 IMPLANT

## 2012-02-13 NOTE — Transfer of Care (Signed)
Immediate Anesthesia Transfer of Care Note  Patient: Anna [name]  Procedure(s) Performed: Procedure(s) (LRB) with comments: DILATATION AND CURETTAGE /HYSTEROSCOPY (N/A) - deficit - 710  Patient Location: PACU  Anesthesia Type:General  Level of Consciousness: awake and oriented  Airway & Oxygen Therapy: Patient Spontanous Breathing and Patient connected to nasal cannula oxygen  Post-op Assessment: Report given to PACU RN  Post vital signs: Reviewed and stable  Complications: No apparent anesthesia complications

## 2012-02-13 NOTE — Interval H&P Note (Signed)
History and Physical Interval Note:  02/13/2012 7:05 AM  Anna [name]  has presented today for surgery, with the diagnosis of POST MENOPAUSAL BLEEDING  The various methods of treatment have been discussed with the patient and family. After consideration of risks, benefits and other options for treatment, the patient has consented to  Procedure(s) (LRB) with comments: DILATATION AND CURETTAGE /HYSTEROSCOPY (N/A) - WITH RESECTOSCOPE as a surgical intervention .  The patient's history has been reviewed, patient examined, no change in status, stable for surgery.  I have reviewed the patient's chart and labs.  Questions were answered to the patient's satisfaction.     Loriana Samad L

## 2012-02-13 NOTE — Anesthesia Procedure Notes (Signed)
Procedure Name: LMA Insertion Date/Time: 02/13/2012 7:25 AM Performed by: Maris Berger T Pre-anesthesia Checklist: Patient identified, Emergency Drugs available, Suction available and Patient being monitored Patient Re-evaluated:Patient Re-evaluated prior to inductionOxygen Delivery Method: Circle System Utilized Preoxygenation: Pre-oxygenation with 100% oxygen Intubation Type: IV induction Ventilation: Mask ventilation without difficulty LMA: LMA inserted LMA Size: 4.0 Number of attempts: 1 Placement Confirmation: positive ETCO2 Dental Injury: Teeth and Oropharynx as per pre-operative assessment  Comments: Gauze roll between teeth

## 2012-02-13 NOTE — Op Note (Signed)
Preoperative diagnoses: Postmenopausal bleeding, endometrial polyps Postoperative diagnoses: Same Operation: Hysteroscopy with excision of endometrial polyps Surgeon: Dr. Eda Paschal Anesthesia: Gen.  Findings: External and vaginal exam was within normal limits. Cervix is clean. Uterus is normal size and shape. Adnexa failed to reveal masses. At the time of hysteroscopy patient had 2 endometrial polyps approximately 1 cm each which came off the right uterine wall close to the tubal ostia. Other than these endometrial cavity and endocervical cavity were free of disease.  Procedure: After general anesthesia the patient was placed in the dorsolithotomy position and prepped and draped in the usual sterile manner. A single-tooth tenaculum was placed on  the anterior lip of the cervix. The cervix was progressively dilated to a #31 Pratt dilator. A hysteroscopic resectoscope was introduced under direct vision with the camera for magnification. 1 and 1/2% glycine was used to expand the uterine cavity. An excellent view was obtained. Initially the polyps were seen. They were resected with a 90 wire loop with appropriate Bovie settings. As they were being resected we started to notice a rapid fluid deficit. We could not see any evidence of uterine perforation. There was a slight hole at the top of his fundus but did not appear to extend through the myometrium. The surgery was finished when the fluid deficit went to 710 cc. The polyps were sent to pathology for tissue diagnosis. Estimated blood loss was minimal. She will be watched carefully in recovery because of the rapid fluid deficit. The patient left the operating room in satisfactory condition.

## 2012-02-13 NOTE — Anesthesia Postprocedure Evaluation (Signed)
  Anesthesia Post-op Note  Patient: Anna [name]  Procedure(s) Performed: Procedure(s) (LRB): DILATATION AND CURETTAGE /HYSTEROSCOPY (N/A)  Patient Location: PACU  Anesthesia Type: General  Level of Consciousness: awake and alert   Airway and Oxygen Therapy: Patient Spontanous Breathing  Post-op Pain: mild  Post-op Assessment: Post-op Vital signs reviewed, Patient's Cardiovascular Status Stable, Respiratory Function Stable, Patent Airway and No signs of Nausea or vomiting  Post-op Vital Signs: stable  Complications: No apparent anesthesia complications

## 2012-02-14 ENCOUNTER — Encounter (HOSPITAL_BASED_OUTPATIENT_CLINIC_OR_DEPARTMENT_OTHER): Payer: Self-pay | Admitting: Obstetrics and Gynecology

## 2012-02-20 ENCOUNTER — Encounter: Payer: Self-pay | Admitting: Obstetrics and Gynecology

## 2012-03-03 ENCOUNTER — Ambulatory Visit (INDEPENDENT_AMBULATORY_CARE_PROVIDER_SITE_OTHER): Payer: Medicare Other | Admitting: Obstetrics and Gynecology

## 2012-03-03 ENCOUNTER — Encounter: Payer: Self-pay | Admitting: Obstetrics and Gynecology

## 2012-03-03 DIAGNOSIS — Z23 Encounter for immunization: Secondary | ICD-10-CM

## 2012-03-03 DIAGNOSIS — N840 Polyp of corpus uteri: Secondary | ICD-10-CM

## 2012-03-03 NOTE — Progress Notes (Signed)
Patient came back today for a postoperative visit. She feels great since surgery. She had not had vaginal bleeding for a while. However she did have menstrual cramping which is disappeared with removal of her endometrial polyp. The pathology report was benign.  Exam: Kennon Portela present.Pelvic exam: External: Within normal limits. BUS within normal limits. Vaginal exam: Within normal limits. Cervix: Clean. Uterus: Within normal limits. Adnexa: Within normal limits. Rectovaginal exam: Within normal limits.   Assessment: Benign endometrial polyp  Plan: Patient reassured. Return In May for annual exam.

## 2012-03-03 NOTE — Patient Instructions (Signed)
Report new bleeding

## 2012-03-04 ENCOUNTER — Telehealth: Payer: Self-pay | Admitting: *Deleted

## 2012-03-04 NOTE — Telephone Encounter (Signed)
Give her 30 with 2 refills

## 2012-03-04 NOTE — Telephone Encounter (Signed)
Pt calling requesting refill on Xanax 0.5 mg tablets, she forgot to request this at OV on 03/03/12. Please advise

## 2012-03-04 NOTE — Telephone Encounter (Signed)
Rx called in to pharmacy. 

## 2012-03-05 ENCOUNTER — Other Ambulatory Visit: Payer: Self-pay | Admitting: Obstetrics and Gynecology

## 2012-03-05 NOTE — Telephone Encounter (Signed)
rx called in

## 2012-09-24 ENCOUNTER — Other Ambulatory Visit: Payer: Self-pay | Admitting: Obstetrics and Gynecology

## 2012-09-24 NOTE — Telephone Encounter (Signed)
Former patient of Dr. Reece Agar-. Last CE was 08/22/11. Dr. Reece Agar prescribed Xanax and wrote "at bedtime as needed for sleep".

## 2012-09-24 NOTE — Telephone Encounter (Signed)
Called into pharmacy

## 2012-11-02 ENCOUNTER — Encounter: Payer: Self-pay | Admitting: Obstetrics and Gynecology

## 2012-11-02 ENCOUNTER — Ambulatory Visit (INDEPENDENT_AMBULATORY_CARE_PROVIDER_SITE_OTHER): Payer: Medicare Other | Admitting: Gynecology

## 2012-11-02 ENCOUNTER — Encounter: Payer: Self-pay | Admitting: Gynecology

## 2012-11-02 VITALS — BP 124/70 | Ht 68.0 in | Wt 188.0 lb

## 2012-11-02 DIAGNOSIS — G4700 Insomnia, unspecified: Secondary | ICD-10-CM

## 2012-11-02 DIAGNOSIS — N952 Postmenopausal atrophic vaginitis: Secondary | ICD-10-CM

## 2012-11-02 DIAGNOSIS — Z124 Encounter for screening for malignant neoplasm of cervix: Secondary | ICD-10-CM

## 2012-11-02 DIAGNOSIS — C439 Malignant melanoma of skin, unspecified: Secondary | ICD-10-CM

## 2012-11-02 MED ORDER — ALPRAZOLAM 0.5 MG PO TABS: ORAL_TABLET | ORAL | Status: DC

## 2012-11-02 NOTE — Patient Instructions (Signed)
Follow up in one year, sooner as needed. 

## 2012-11-02 NOTE — Addendum Note (Signed)
Addended by: Dayna Barker on: 11/02/2012 02:07 PM   Modules accepted: Orders

## 2012-11-02 NOTE — Progress Notes (Signed)
Anna [name] 1945/11/04 [id number]        67 y.o.  [id number] for followup exam.  Former patient of Dr. Eda Paschal.  Past medical history,surgical history, medications, allergies, family history and social history were all reviewed and documented in the EPIC chart.  ROS:  Performed and pertinent positives and negatives are included in the history, assessment and plan .  Exam: Kim assistant Filed Vitals:   11/02/12 1208  BP: 124/70  Height: 5\' 8"  (1.727 m)  Weight: 188 lb (85.276 kg)   General appearance  Normal Skin grossly normal Head/Neck normal with no cervical or supraclavicular adenopathy thyroid normal Lungs  clear Cardiac RR, without RMG Abdominal  soft, nontender, without masses, organomegaly or hernia Breasts  examined lying and sitting without masses, retractions, discharge or axillary adenopathy. Pelvic  Ext/BUS/vagina  normal with atrophic changes.  Cervix  normal with atrophic and scarred changes  Uterus  anteverted, normal size, shape and contour, midline and mobile nontender   Adnexa  Without masses or tenderness    Anus and perineum  normal   Rectovaginal  normal sphincter tone without palpated masses or tenderness.    Assessment/Plan:  67 y.o. [id number] female for followup exam.   1. Postmenopausal/atrophic vaginitis. Without significant hot flashes night sweats vaginal dryness or dyspareunia. We'll continue observation. Report any vaginal bleeding. 2. Insomnia. Patient has a history of chronic insomnia for which she uses Xanax 0.5 mg when necessary for sleep. Had been given by Dr. Eda Paschal for years. I refilled her #[id number] 3. Pap smear 2013. Reviewed current screening guidelines. History of CIN-1 status post LEEP 2005 with normal Pap smears since. Options to stop screening altogether versus less frequent screening intervals discussed. The patient feels very strongly that she wants to continue with a Pap smear this year and asked me to do so and I did a Pap  smear. 4. Mammography today. Continue with annual mammography. SBE monthly reviewed. 5. History of melanoma. Vulvar exam today is normal. Actively sees her dermatologist. Continue with annual physician vulvar exams. 6. DEXA 2008 normal. History of prior normal DEXA before this. Recommend repeating at a 10 year interval. Increase calcium vitamin D reviewed. 7. Colonoscopy 2013. Repeat at their recommended interval. 8. Health maintenance. The blood work done. Is in the process of establishing care with a primary physician will follow up with them for routine healthcare and blood work. Followup for GYN exam in one year, sooner as needed.  Note: This document was prepared with digital dictation and possible smart phrase technology. Any transcriptional errors that result from this process are unintentional.   Dara Lords MD, 12:46 PM 11/02/2012

## 2012-11-03 ENCOUNTER — Encounter: Payer: Self-pay | Admitting: Gynecology

## 2012-11-03 LAB — URINALYSIS W MICROSCOPIC + REFLEX CULTURE
Bacteria, UA: NONE SEEN
Bilirubin Urine: NEGATIVE
Casts: NONE SEEN
Crystals: NONE SEEN
Glucose, UA: NEGATIVE mg/dL
Hgb urine dipstick: NEGATIVE
Ketones, ur: NEGATIVE mg/dL
Leukocytes, UA: NEGATIVE
Nitrite: NEGATIVE
Protein, ur: NEGATIVE mg/dL
Specific Gravity, Urine: 1.022 (ref 1.005–1.030)
Urobilinogen, UA: 0.2 mg/dL (ref 0.0–1.0)
pH: 5.5 (ref 5.0–8.0)

## 2012-12-09 ENCOUNTER — Observation Stay (HOSPITAL_COMMUNITY)
Admission: EM | Admit: 2012-12-09 | Discharge: 2012-12-10 | Disposition: A | Discharge status: Home / Self Care | Payer: Medicare Other | Attending: Internal Medicine | Admitting: Internal Medicine

## 2012-12-09 ENCOUNTER — Emergency Department (HOSPITAL_COMMUNITY): Payer: Medicare Other

## 2012-12-09 ENCOUNTER — Encounter (HOSPITAL_COMMUNITY): Payer: Self-pay

## 2012-12-09 DIAGNOSIS — H538 Other visual disturbances: Secondary | ICD-10-CM | POA: Diagnosis present

## 2012-12-09 DIAGNOSIS — F411 Generalized anxiety disorder: Secondary | ICD-10-CM | POA: Insufficient documentation

## 2012-12-09 DIAGNOSIS — R319 Hematuria, unspecified: Secondary | ICD-10-CM | POA: Diagnosis present

## 2012-12-09 DIAGNOSIS — R1011 Right upper quadrant pain: Secondary | ICD-10-CM | POA: Insufficient documentation

## 2012-12-09 DIAGNOSIS — R1012 Left upper quadrant pain: Secondary | ICD-10-CM | POA: Insufficient documentation

## 2012-12-09 DIAGNOSIS — R0789 Other chest pain: Principal | ICD-10-CM | POA: Insufficient documentation

## 2012-12-09 DIAGNOSIS — R079 Chest pain, unspecified: Secondary | ICD-10-CM | POA: Diagnosis present

## 2012-12-09 DIAGNOSIS — F410 Panic disorder [episodic paroxysmal anxiety] without agoraphobia: Secondary | ICD-10-CM | POA: Diagnosis present

## 2012-12-09 LAB — CBC WITH DIFFERENTIAL/PLATELET
Basophils Absolute: 0.1 10*3/uL (ref 0.0–0.1)
Basophils Relative: 1 % (ref 0–1)
Eosinophils Absolute: 0.1 10*3/uL (ref 0.0–0.7)
Eosinophils Relative: 1 % (ref 0–5)
HCT: 40 % (ref 36.0–46.0)
Hemoglobin: 14 g/dL (ref 12.0–15.0)
Lymphocytes Relative: 35 % (ref 12–46)
Lymphs Abs: 2.7 10*3/uL (ref 0.7–4.0)
MCH: 32.9 pg (ref 26.0–34.0)
MCHC: 35 g/dL (ref 30.0–36.0)
MCV: 93.9 fL (ref 78.0–100.0)
Monocytes Absolute: 0.6 10*3/uL (ref 0.1–1.0)
Monocytes Relative: 7 % (ref 3–12)
Neutro Abs: 4.5 10*3/uL (ref 1.7–7.7)
Neutrophils Relative %: 56 % (ref 43–77)
Platelets: 282 10*3/uL (ref 150–400)
RBC: 4.26 MIL/uL (ref 3.87–5.11)
RDW: 13.2 % (ref 11.5–15.5)
WBC: 7.9 10*3/uL (ref 4.0–10.5)

## 2012-12-09 LAB — COMPREHENSIVE METABOLIC PANEL
ALT: 14 U/L (ref 0–35)
AST: 18 U/L (ref 0–37)
Albumin: 3.9 g/dL (ref 3.5–5.2)
Alkaline Phosphatase: 86 U/L (ref 39–117)
BUN: 15 mg/dL (ref 6–23)
CO2: 25 mEq/L (ref 19–32)
Calcium: 9.4 mg/dL (ref 8.4–10.5)
Chloride: 106 mEq/L (ref 96–112)
Creatinine, Ser: 0.84 mg/dL (ref 0.50–1.10)
GFR calc Af Amer: 83 mL/min — ABNORMAL LOW (ref >90)
GFR calc non Af Amer: 71 mL/min — ABNORMAL LOW (ref >90)
Glucose, Bld: 95 mg/dL (ref 70–99)
Potassium: 3.8 mEq/L (ref 3.5–5.1)
Sodium: 140 mEq/L (ref 135–145)
Total Bilirubin: 0.1 mg/dL — ABNORMAL LOW (ref 0.3–1.2)
Total Protein: 7.5 g/dL (ref 6.0–8.3)

## 2012-12-09 LAB — POCT I-STAT TROPONIN I: Troponin i, poc: 0 ng/mL (ref 0.00–0.08)

## 2012-12-09 LAB — LIPASE, BLOOD: Lipase: 22 U/L (ref 11–59)

## 2012-12-09 MED ORDER — NITROGLYCERIN 0.4 MG SL SUBL
0.4000 mg | SUBLINGUAL_TABLET | SUBLINGUAL | Status: DC | PRN
  Administered 2012-12-09: 0.4 mg via SUBLINGUAL

## 2012-12-09 MED ORDER — ASPIRIN 81 MG PO CHEW
324.0000 mg | CHEWABLE_TABLET | Freq: Once | ORAL | Status: AC
  Administered 2012-12-09: 324 mg via ORAL
  Filled 2012-12-09: qty 4

## 2012-12-09 MED ORDER — ONDANSETRON HCL 4 MG/2ML IJ SOLN
4.0000 mg | Freq: Once | INTRAMUSCULAR | Status: DC
  Filled 2012-12-09: qty 2

## 2012-12-09 MED ORDER — MORPHINE SULFATE 4 MG/ML IJ SOLN
4.0000 mg | Freq: Once | INTRAMUSCULAR | Status: DC
  Filled 2012-12-09: qty 1

## 2012-12-09 NOTE — ED Provider Notes (Signed)
67 year old female who has a history of chronic low back pain on the left side and a recent history of hematuria. She had presented to her urology office today for imaging including a CT scan with contrast of her abdomen according to the patient's report (reports not visualized in the electronic medical record). She states that near the end of the procedure while she was in the CAT scan her she started to feel a pressure sensation in her lower chest and upper abdomen which has been relatively persistent but waxing and waning throughout the day. There is radiation left side, no nausea vomiting  As she was leaving the office while driving she developed acute onset of bilateral blurry vision. This is without eye pain and not associated with facial droop, slurred speech, weakness or numbness of the 4 extremities or difficulty ambulating. This has been persistent for over 12 hours at this point.  On recheck her VA is 20/50 bilaterally.  On my exam the patient has normal pupils, normal conjunctiva, visualized crisp optic discs.  She has normal appearing eyes, normal periorbital tissues. She has normal extraocular movements, normal cranial nerves III through XII, normal heart sounds, lung sounds and a benign abdominal exam without tenderness. There, here she clear speech and normal memory  Medical screening examination/treatment/procedure(s) were conducted as a shared visit with non-physician practitioner(s) and myself.  I personally evaluated the patient during the encounter.  Clinical Impression: CP, Blurred Vision      Vida Roller, MD 12/11/12 [phone]

## 2012-12-09 NOTE — ED Notes (Signed)
Pt c/o LUQ pain radiating to RUQ, dizziness, lightheaded, SOB and blurry vision starting after having her abdominal CT scan.

## 2012-12-09 NOTE — ED Provider Notes (Signed)
CSN: [id number]     Arrival date & time 12/09/12  2026 History   First MD Initiated Contact with Patient 12/09/12 2109     Chief Complaint  Patient presents with  . Abdominal Pain   (Consider location/radiation/quality/duration/timing/severity/associated sxs/prior Treatment) HPI  Anna [name] is a 67 y.o. female complaining of acute onset of LUQ pain this afternoon while she was in a CT scan at [facility] at 13:30. CT to evaluate hematuria x2weeks ago now resolved. Patient describes her pain as burning, located under the left breast rated 6/10 at worse 3/10 right now associated with diaphoresis, pain radiates to the right upper quadrant and to back. Patient also states that there is associated blurred vision which she is still having, onset as she was leaving [facility]. Patient received her CT with contrast. She has had contrast studies in the past with no adverse effects. Patient denies shortness of breath, nausea, vomiting, fever. Patient states that she has developed a dry cough since the pain started. She has  few cardiac risk factors other than advanced age and history of high cholesterol well controlled with diet alone. Patient has history of Wolff-Parkinson-White, affectively controlled with ablation in 96. She had a catheterization at that time and reported there is no tissues the catheterization, performed at [facility]. Patient denies any history of DVT, PE, recent mobilizations, long trips, calf pain or leg swelling, dysarthria, ataxia, unilateral weakness, slurred speech or facial asymmetry.    Past Medical History  Diagnosis Date  . History of melanoma excision RIGHT LEG  --  2012  . 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 Anxiety   . 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 Cervical biopsy  w/ loop electrode excision  2005  . Svt-cardiac ablation    . Breast surgery   1999    BREAST MASS EXCISED  . Tubal ligation    . Colposcopy    . Melanoma excised    . Laparoscopic cholecystectomy  05-16-2005  . Cardiac electrophysiology study and ablation  1997    SUCCESSFUL ABLATION OF WOLFE-PARKINSON-WHITE SYNDROME  (NO ISSUES SINCE)  . Hysteroscopy w/d&c  02/13/2012    Procedure: DILATATION AND CURETTAGE /HYSTEROSCOPY;  Surgeon: Trellis Paganini, MD;  Location: [facility];  Service: Gynecology;  Laterality: N/A;  deficit - 710  . Dilation and curettage of uterus    . Hysteroscopy     Family History  Problem Relation Age of Onset  . Cancer Mother     ESOPHAGEAL CANCER  . Diabetes Father   . Hyperlipidemia Father   . Heart disease Father   . Diabetes Brother   . Breast cancer Paternal Aunt     Age 56's  . Hyperlipidemia Paternal Grandmother   . Heart disease Paternal Grandmother   . Diabetes Paternal Grandfather   . Hyperlipidemia Paternal Grandfather   . Heart disease Paternal Grandfather    History  Substance Use Topics  . Smoking status: Never Smoker   . Smokeless tobacco: Never Used  . Alcohol Use: Yes     Comment: rare   OB History   Grav Para Term Preterm Abortions TAB SAB Ect Mult Living   2 2 2       2      Review of Systems 10 systems reviewed and found to be negative, except as noted in the  HPI   Allergies  Azithromycin and Sulfa antibiotics  Home Medications   Current Outpatient Rx  Name  Route  Sig  Dispense  Refill  . ALPRAZolam (XANAX) 0.5 MG tablet      TAKE 1 TABLET AT BEDTIME AS NEEDED   30 tablet   5   . Calcium Carbonate-Vitamin D (CALCIUM-D PO)   Oral   Take 1 tablet by mouth daily.          . Cholecalciferol (VITAMIN D) 2000 UNITS CAPS   Oral   Take 1 capsule by mouth daily.          . hydroxypropyl methylcellulose (ISOPTO TEARS) 2.5 % ophthalmic solution   Both Eyes   Place 1 drop into both eyes daily as needed. For dry eyes         . Multiple Vitamin (MULTIVITAMIN) capsule    Oral   Take 1 capsule by mouth daily.          . Omega-3 Fatty Acids (THE VERY FINEST FISH OIL) LIQD   Oral   Take by mouth daily. ONE TABLESPOON DAILY         . Probiotic Product (PROBIOTIC PO)   Oral   Take 2 capsules by mouth daily.          . sodium chloride (OCEAN) 0.65 % SOLN nasal spray   Nasal   Place 1 spray into the nose daily as needed for congestion.          BP 174/86  Pulse 92  Temp(Src) 97.8 F (36.6 C) (Oral)  Resp 16  Wt 181 lb (82.101 kg)  BMI 27.53 kg/m2  SpO2 99% Physical Exam  Nursing note and vitals reviewed. Constitutional: She is oriented to person, place, and time. She appears well-developed and well-nourished. No distress.  HENT:  Head: Normocephalic and atraumatic.  Mouth/Throat: Oropharynx is clear and moist.  Eyes: Conjunctivae and EOM are normal. Pupils are equal, round, and reactive to light.  OA 20/25 OS 20/50 OD 20/50  Bilateral funduscopic exam shows crisp optic discs  Neck: Normal range of motion. Neck supple. No JVD present.  Cardiovascular: Normal rate, regular rhythm and intact distal pulses.   Pulmonary/Chest: Effort normal and breath sounds normal. No stridor. No respiratory distress. She has no wheezes. She has no rales. She exhibits no tenderness.   pain is nonreproducible to palpation  Abdominal: Soft. Bowel sounds are normal. She exhibits no distension and no mass. There is tenderness. There is no rebound and no guarding.  Minimal tender to palpation diffusely, no guarding or rebound  Genitourinary:  No CVA tenderness bilaterally  Musculoskeletal: Normal range of motion. She exhibits no edema.  No calf asymmetry, superficial collaterals, palpable cords, edema, Homans sign negative bilaterally.    Neurological: She is alert and oriented to person, place, and time.  Cranial nerves III through XII intact, strength 5 out of 5x4 extremities, negative pronator drift, finger to nose and heel-to-shin coordinated, sensation  intact to pinprick and light touch, gait is coordinated and Romberg is negative.    Skin: Skin is warm.  Bandage to left upper chest the patient had basal cell carcinoma excised recently.  Psychiatric: She has a normal mood and affect.    ED Course  Procedures (including critical care time) Labs Review Labs Reviewed  COMPREHENSIVE METABOLIC PANEL - Abnormal; Notable for the following:    Total Bilirubin 0.1 (*)    GFR calc non Af Amer 71 (*)    GFR calc  Af Amer 83 (*)    All other components within normal limits  CBC WITH DIFFERENTIAL  LIPASE, BLOOD  D-DIMER, QUANTITATIVE  POCT I-STAT TROPONIN I  POCT I-STAT TROPONIN I   Imaging Review Dg Chest 2 View  12/09/2012   *RADIOLOGY REPORT*  Clinical Data: Chest pain under the left breast and radiating to the right side bending to the mid chest with shortness of breath. Pain occurred after a CT abdomen with contrast today.  CHEST - 2 VIEW  Comparison: 05/16/2005  Findings: Slightly shallow inspiration. The heart size and pulmonary vascularity are normal. The lungs appear clear and expanded without focal air space disease or consolidation. No blunting of the costophrenic angles.  No pneumothorax.  Mediastinal contours appear intact.  Degenerative changes in the thoracic spine.  Surgical clips in the right upper quadrant.  No significant changes since the previous study.  IMPRESSION: No evidence of active pulmonary disease.   Original Report Authenticated By: Burman Nieves, M.D.   Ct Head Wo Contrast  12/10/2012   *RADIOLOGY REPORT*  Clinical Data: Headache  CT HEAD WITHOUT CONTRAST  Technique:  Contiguous axial images were obtained from the base of the skull through the vertex without contrast.  Comparison: None.  Findings: The brain has a normal appearance without evidence for hemorrhage, infarction, hydrocephalus, or mass lesion.  There is no extra axial fluid collection.  The skull and paranasal sinuses are normal.  IMPRESSION:  Negative  exam.   Original Report Authenticated By: Signa Kell, M.D.    Date: 12/09/2012  Rate: 87  Rhythm: normal sinus rhythm with sinus arythmia  QRS Axis: normal  Intervals: normal  ST/T Wave abnormalities: nonspecific ST/T changes  Conduction Disutrbances:none  Narrative Interpretation:   Old EKG Reviewed: none available  MDM   1. Chest pain   2. Blurred vision, bilateral     Filed Vitals:   12/09/12 2130 12/09/12 2230 12/09/12 2245 12/09/12 2300  BP: 153/77 134/84 141/73 137/62  Pulse: 84 85 85 76  Temp:      TempSrc:      Resp: 22 20 13 15   Weight:      SpO2: 95% 97% 98% 98%     Anna [name] is a 67 y.o. female with left upper quadrant pain just under her ribs, described as burning, rated 6/10 at worst radiating to the right upper quadrant and back onset at 1330. Patient has no cardiac history, had a clean cath in 1996 as preop testing for ablation for worst Parkinson White, does not follow with the cardiologist.  Patient states nitroglycerin has not used her pain, she refuses morphine, we have compromised with acetaminophen.  Visual acuity is 20/50 in each individual aye and bilaterally. Patient states that visual changes started just after she [facility] roughly at 13:45. Protime the patient presented to the ED she was out of the TPA window. This is shared this with the attending physician Dr. Hyacinth Meeker was personally evaluated the patient, performed neurologic exam which is nonfocal.   Head CT is normal and d-dimer is negative which is reassuring against aortic dissection.  Patient will be admitted to a step down unit( as she is currently not chest pain free) for low-risk rule out under the care of Dr. Allena Katz.  Medications  nitroGLYCERIN (NITROSTAT) SL tablet 0.4 mg (0.4 mg Sublingual Given 12/09/12 2221)  morphine 4 MG/ML injection 4 mg (4 mg Intravenous Not Given 12/09/12 2317)  ondansetron (ZOFRAN) injection 4 mg (4 mg Intravenous Not Given  12/09/12 2317)  aspirin  chewable tablet 324 mg (324 mg Oral Given 12/09/12 2220)   Note: Portions of this report may have been transcribed using voice recognition software. Every effort was made to ensure accuracy; however, inadvertent computerized transcription errors may be present      Wynetta Emery, PA-C 12/10/12 0121

## 2012-12-09 NOTE — ED Notes (Signed)
Pt reports pain has improved since walking to restroom.

## 2012-12-10 ENCOUNTER — Emergency Department (HOSPITAL_COMMUNITY): Payer: Medicare Other

## 2012-12-10 DIAGNOSIS — H538 Other visual disturbances: Secondary | ICD-10-CM | POA: Diagnosis present

## 2012-12-10 DIAGNOSIS — R319 Hematuria, unspecified: Secondary | ICD-10-CM | POA: Diagnosis present

## 2012-12-10 DIAGNOSIS — H5310 Unspecified subjective visual disturbances: Secondary | ICD-10-CM

## 2012-12-10 DIAGNOSIS — F411 Generalized anxiety disorder: Secondary | ICD-10-CM

## 2012-12-10 DIAGNOSIS — R079 Chest pain, unspecified: Secondary | ICD-10-CM | POA: Diagnosis present

## 2012-12-10 DIAGNOSIS — F410 Panic disorder [episodic paroxysmal anxiety] without agoraphobia: Secondary | ICD-10-CM | POA: Diagnosis present

## 2012-12-10 DIAGNOSIS — R42 Dizziness and giddiness: Secondary | ICD-10-CM

## 2012-12-10 LAB — CBC WITH DIFFERENTIAL/PLATELET
Basophils Absolute: 0 10*3/uL (ref 0.0–0.1)
Basophils Relative: 1 % (ref 0–1)
Eosinophils Absolute: 0.1 10*3/uL (ref 0.0–0.7)
Eosinophils Relative: 1 % (ref 0–5)
HCT: 36.4 % (ref 36.0–46.0)
Hemoglobin: 12.8 g/dL (ref 12.0–15.0)
Lymphocytes Relative: 46 % (ref 12–46)
Lymphs Abs: 3 10*3/uL (ref 0.7–4.0)
MCH: 32.7 pg (ref 26.0–34.0)
MCHC: 35.2 g/dL (ref 30.0–36.0)
MCV: 93.1 fL (ref 78.0–100.0)
Monocytes Absolute: 0.4 10*3/uL (ref 0.1–1.0)
Monocytes Relative: 6 % (ref 3–12)
Neutro Abs: 3.1 10*3/uL (ref 1.7–7.7)
Neutrophils Relative %: 46 % (ref 43–77)
Platelets: 245 10*3/uL (ref 150–400)
RBC: 3.91 MIL/uL (ref 3.87–5.11)
RDW: 13.2 % (ref 11.5–15.5)
WBC: 6.6 10*3/uL (ref 4.0–10.5)

## 2012-12-10 LAB — COMPREHENSIVE METABOLIC PANEL
ALT: 10 U/L (ref 0–35)
AST: 14 U/L (ref 0–37)
Albumin: 3.5 g/dL (ref 3.5–5.2)
Alkaline Phosphatase: 75 U/L (ref 39–117)
BUN: 12 mg/dL (ref 6–23)
CO2: 24 mEq/L (ref 19–32)
Calcium: 9 mg/dL (ref 8.4–10.5)
Chloride: 108 mEq/L (ref 96–112)
Creatinine, Ser: 0.71 mg/dL (ref 0.50–1.10)
GFR calc Af Amer: >90 mL/min (ref >90)
GFR calc non Af Amer: 89 mL/min — ABNORMAL LOW (ref >90)
Glucose, Bld: 95 mg/dL (ref 70–99)
Potassium: 3.8 mEq/L (ref 3.5–5.1)
Sodium: 142 mEq/L (ref 135–145)
Total Bilirubin: 0.3 mg/dL (ref 0.3–1.2)
Total Protein: 6.6 g/dL (ref 6.0–8.3)

## 2012-12-10 LAB — TROPONIN I
Troponin I: <0.3 ng/mL (ref <0.30)
Troponin I: <0.3 ng/mL (ref <0.30)
Troponin I: <0.3 ng/mL (ref <0.30)
Troponin I: <0.3 ng/mL (ref <0.30)

## 2012-12-10 LAB — PROTIME-INR
INR: 1.03 (ref 0.00–1.49)
Prothrombin Time: 13.3 seconds (ref 11.6–15.2)

## 2012-12-10 LAB — LIPID PANEL
Cholesterol: 201 mg/dL — ABNORMAL HIGH (ref 0–200)
HDL: 47 mg/dL (ref >39)
LDL Cholesterol: 139 mg/dL — ABNORMAL HIGH (ref 0–99)
Total CHOL/HDL Ratio: 4.3 RATIO
Triglycerides: 77 mg/dL (ref <150)
VLDL: 15 mg/dL (ref 0–40)

## 2012-12-10 LAB — POCT I-STAT TROPONIN I: Troponin i, poc: 0 ng/mL (ref 0.00–0.08)

## 2012-12-10 LAB — D-DIMER, QUANTITATIVE: D-Dimer, Quant: 0.42 ug/mL-FEU (ref 0.00–0.48)

## 2012-12-10 LAB — MRSA PCR SCREENING: MRSA by PCR: NEGATIVE

## 2012-12-10 MED ORDER — ONDANSETRON HCL 4 MG/2ML IJ SOLN: 4.0000 mg | Freq: Three times a day (TID) | INTRAMUSCULAR | Status: AC | PRN

## 2012-12-10 MED ORDER — ASPIRIN EC 81 MG PO TBEC
81.0000 mg | DELAYED_RELEASE_TABLET | Freq: Every day | ORAL | Status: DC
  Administered 2012-12-10: 81 mg via ORAL
  Filled 2012-12-10: qty 1

## 2012-12-10 MED ORDER — ENOXAPARIN SODIUM 40 MG/0.4ML ~~LOC~~ SOLN
40.0000 mg | Freq: Every day | SUBCUTANEOUS | Status: DC
  Filled 2012-12-10 (×2): qty 0.4

## 2012-12-10 MED ORDER — ACETAMINOPHEN 325 MG PO TABS
650.0000 mg | ORAL_TABLET | ORAL | Status: DC | PRN
  Administered 2012-12-10: 650 mg via ORAL
  Filled 2012-12-10: qty 2

## 2012-12-10 MED ORDER — GI COCKTAIL ~~LOC~~
30.0000 mL | Freq: Four times a day (QID) | ORAL | Status: DC | PRN
  Filled 2012-12-10: qty 30

## 2012-12-10 MED ORDER — ENOXAPARIN SODIUM 40 MG/0.4ML ~~LOC~~ SOLN
40.0000 mg | Freq: Every day | SUBCUTANEOUS | Status: DC
  Administered 2012-12-10: 40 mg via SUBCUTANEOUS

## 2012-12-10 MED ORDER — NITROGLYCERIN 0.4 MG SL SUBL: 0.4000 mg | SUBLINGUAL_TABLET | SUBLINGUAL | Status: DC | PRN

## 2012-12-10 MED ORDER — ALPRAZOLAM 0.25 MG PO TABS
0.5000 mg | ORAL_TABLET | Freq: Once | ORAL | Status: AC
  Administered 2012-12-10: 0.5 mg via ORAL
  Filled 2012-12-10: qty 2

## 2012-12-10 MED ORDER — ALPRAZOLAM 0.25 MG PO TABS: 0.5000 mg | ORAL_TABLET | Freq: Every evening | ORAL | Status: DC | PRN

## 2012-12-10 MED ORDER — ONDANSETRON HCL 4 MG/2ML IJ SOLN: 4.0000 mg | Freq: Four times a day (QID) | INTRAMUSCULAR | Status: DC | PRN

## 2012-12-10 MED ORDER — MORPHINE SULFATE 2 MG/ML IJ SOLN: 2.0000 mg | INTRAMUSCULAR | Status: DC | PRN

## 2012-12-10 MED ORDER — HYDROMORPHONE HCL PF 1 MG/ML IJ SOLN: 1.0000 mg | INTRAMUSCULAR | Status: AC | PRN

## 2012-12-10 NOTE — H&P (Signed)
[facility] History and Physical  Patient: Anna [name]  MVH:[id number]  DOB: Jul 22, 1945  DOA: 12/09/2012  Referring physician: Wilson Singer PCP: No primary provider on file.   Chief Complaint: Left-sided abdominal and chest pain  HPI: Anna [name] is a 67 y.o. female with Past medical history of anxiety and history of ablation for WPW syndrome. She presented with complain of left-sided pain located in the upper abdomen and lower rib region associated with nausea, dizziness, diaphoresis and shortness of breath. The pain started when she was driving back home after a CT scan of the abdomen with contrast which was done to workup her complaint of hematuria. The pain continued until she reached ER and got better with nitroglycerin and Tylenol. Currently she mentions she still has an mild pain which is worsening with breathing and movement and also pressure. She denies any complaint of orthopnea PND leg swelling shortness of breath at present. She also noted bilateral blurring of vision that started at the same time that the pain. The blurring has improved at present it was painless she denies any pain with movement of the eye. She denies any other focal neurological deficit.  Review of Systems: as mentioned in the history of present illness.  A Comprehensive review of the other systems is negative.  Past Medical History  Diagnosis Date  . History of melanoma excision RIGHT LEG  --  2012  . 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 Anxiety   . 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 Cervical biopsy  w/ loop electrode excision  2005  . Svt-cardiac ablation    . Breast surgery  1999    BREAST MASS EXCISED  . Tubal ligation    . Colposcopy    . Melanoma excised    . Laparoscopic cholecystectomy  05-16-2005  . Cardiac electrophysiology study and ablation  1997    SUCCESSFUL  ABLATION OF WOLFE-PARKINSON-WHITE SYNDROME  (NO ISSUES SINCE)  . Hysteroscopy w/d&c  02/13/2012    Procedure: DILATATION AND CURETTAGE /HYSTEROSCOPY;  Surgeon: Trellis Paganini, MD;  Location: [facility];  Service: Gynecology;  Laterality: N/A;  deficit - 710  . Dilation and curettage of uterus    . Hysteroscopy     Social History:  reports that she has never smoked. She has never used smokeless tobacco. She reports that  drinks alcohol. She reports that she does not use illicit drugs. Patient is coming from home. Independent for most of her  ADL.  Allergies  Allergen Reactions  . Azithromycin Diarrhea and Other (See Comments)    HEART RACES AND CRAMPING  . Sulfa Antibiotics Hives and Nausea Only    Family History  Problem Relation Age of Onset  . Cancer Mother     ESOPHAGEAL CANCER  . Diabetes Father   . Hyperlipidemia Father   . Heart disease Father   . Diabetes Brother   . Breast cancer Paternal Aunt     Age 50's  . Hyperlipidemia Paternal Grandmother   . Heart disease Paternal Grandmother   . Diabetes Paternal Grandfather   . Hyperlipidemia Paternal Grandfather   . Heart disease Paternal Grandfather     Prior to Admission medications   Medication Sig Start Date End Date Taking? Authorizing Provider  ALPRAZolam Prudy Feeler) 0.5 MG tablet TAKE 1 TABLET AT BEDTIME AS NEEDED 11/02/12  Yes Marcial Pacas  P Fontaine, MD  Calcium Carbonate-Vitamin D (CALCIUM-D PO) Take 1 tablet by mouth daily.    Yes Historical Provider, MD  Cholecalciferol (VITAMIN D) 2000 UNITS CAPS Take 1 capsule by mouth daily.    Yes Historical Provider, MD  hydroxypropyl methylcellulose (ISOPTO TEARS) 2.5 % ophthalmic solution Place 1 drop into both eyes daily as needed. For dry eyes   Yes Historical Provider, MD  Multiple Vitamin (MULTIVITAMIN) capsule Take 1 capsule by mouth daily.    Yes Historical Provider, MD  Omega-3 Fatty Acids (THE VERY FINEST FISH OIL) LIQD Take by mouth daily. ONE TABLESPOON  DAILY   Yes Historical Provider, MD  Probiotic Product (PROBIOTIC PO) Take 2 capsules by mouth daily.    Yes Historical Provider, MD  sodium chloride (OCEAN) 0.65 % SOLN nasal spray Place 1 spray into the nose daily as needed for congestion.   Yes Historical Provider, MD    Physical Exam: Filed Vitals:   12/09/12 2130 12/09/12 2230 12/09/12 2245 12/09/12 2300  BP: 153/77 134/84 141/73 137/62  Pulse: 84 85 85 76  Temp:      TempSrc:      Resp: 22 20 13 15   Weight:      SpO2: 95% 97% 98% 98%    General: Alert, Awake and Oriented to Time, Place and Person. Appear in mild distress Eyes: PERRL ENT: Oral Mucosa clear moist. Neck: no JVD, no  Carotid Bruits  Cardiovascular: S1 and S2 Present, no Murmur, Peripheral Pulses Present Respiratory: Bilateral Air entry equal and Decreased, Clear to Auscultation,  o Crackles,no wheezes Abdomen: Bowel Sound Present, Soft and minimally tender in the left upper quadrant  Skin: no Rash Extremities: no Pedal edema, no calf tenderness Neurologic: Grossly Unremarkable.  Labs on Admission:  CBC:  Recent Labs Lab 12/09/12 2035  WBC 7.9  NEUTROABS 4.5  HGB 14.0  HCT 40.0  MCV 93.9  PLT 282    CMP     Component Value Date/Time   NA 140 12/09/2012 2035   K 3.8 12/09/2012 2035   CL 106 12/09/2012 2035   CO2 25 12/09/2012 2035   GLUCOSE 95 12/09/2012 2035   BUN 15 12/09/2012 2035   CREATININE 0.84 12/09/2012 2035   CREATININE 0.83 08/22/2011 1530   CALCIUM 9.4 12/09/2012 2035   PROT 7.5 12/09/2012 2035   ALBUMIN 3.9 12/09/2012 2035   AST 18 12/09/2012 2035   ALT 14 12/09/2012 2035   ALKPHOS 86 12/09/2012 2035   BILITOT 0.1* 12/09/2012 2035   GFRNONAA 71* 12/09/2012 2035   GFRAA 83* 12/09/2012 2035     Recent Labs Lab 12/09/12 2035  LIPASE 22   No results found for this basename: AMMONIA,  in the last 168 hours  Cardiac Enzymes: No results found for this basename: CKTOTAL, CKMB, CKMBINDEX, TROPONINI,  in the last 168 hours  BNP (last 3  results) No results found for this basename: PROBNP,  in the last 8760 hours  Radiological Exams on Admission: Dg Chest 2 View  12/09/2012   *RADIOLOGY REPORT*  Clinical Data: Chest pain under the left breast and radiating to the right side bending to the mid chest with shortness of breath. Pain occurred after a CT abdomen with contrast today.  CHEST - 2 VIEW  Comparison: 05/16/2005  Findings: Slightly shallow inspiration. The heart size and pulmonary vascularity are normal. The lungs appear clear and expanded without focal air space disease or consolidation. No blunting of the costophrenic angles.  No pneumothorax.  Mediastinal contours appear  intact.  Degenerative changes in the thoracic spine.  Surgical clips in the right upper quadrant.  No significant changes since the previous study.  IMPRESSION: No evidence of active pulmonary disease.   Original Report Authenticated By: Burman Nieves, M.D.   Ct Head Wo Contrast  12/10/2012   *RADIOLOGY REPORT*  Clinical Data: Headache  CT HEAD WITHOUT CONTRAST  Technique:  Contiguous axial images were obtained from the base of the skull through the vertex without contrast.  Comparison: None.  Findings: The brain has a normal appearance without evidence for hemorrhage, infarction, hydrocephalus, or mass lesion.  There is no extra axial fluid collection.  The skull and paranasal sinuses are normal.  IMPRESSION:  Negative exam.   Original Report Authenticated By: Signa Kell, M.D.    EKG: Independently reviewed. normal EKG, normal sinus rhythm.  Assessment/Plan Principal Problem:   Chest pain Active Problems:   Blurred vision, bilateral   Hematuria   1. Chest pain The patient appear to have atypical chest pain, possible etiology includes musculoskeletal do to reproducibility of the pain, GERD. Her symptoms are less likely cardiac in nature but she continues to have pain at present. she has negative troponin, negative d-dimer, normal EKG. With this  patient will be monitored in the hospital under observation. Patient will be monitored on telemetry and step down unit due to ongoing chest pain. Serial troponins and echocardiogram in the morning.  2. blurring of the vision Patient has bilateral blurring of the vision which is likely secondary to poor oral intake prior to her CT scan. She has a negative CT of the head in the ER and currently does not have any focal neurological deficit. With this we will be continuing neuro checks. We will also check carotid duplex to rule out any stenosis.  3. Anxiety Continue Xanax  DVT Prophylaxis: subcutaneous Heparin Nutrition: Cardiac diet   Code Status: Full     Author: Lynden Oxford, MD [facility] Pager: [phone] 12/10/2012, 2:48 AM    If 7PM-7AM, please contact night-coverage [email or web address] Password [email or web address]

## 2012-12-10 NOTE — Progress Notes (Signed)
  Echocardiogram 2D Echocardiogram has been performed.  Anna [name] 12/10/2012, 12:16 PM

## 2012-12-10 NOTE — Discharge Summary (Signed)
[facility] Discharge summary  Patient: Anna [name]  WUJ:[id number]  DOB: 08-11-1945  DOA: 12/09/2012  Referring physician: Wilson Singer PCP: No primary provider on file.   Chief Complaint: Left-sided abdominal and chest pain  HPI: Anna [name] is a 67 y.o. female with Past medical history of anxiety and history of ablation for WPW syndrome. She presented with complain of left-sided pain located in the upper abdomen and lower rib region associated with nausea, dizziness, diaphoresis and shortness of breath. The pain started when she was driving back home after a CT scan of the abdomen with contrast which was done to workup her complaint of hematuria. The pain continued until she reached ER and got better with nitroglycerin and Tylenol. Currently she mentions she still has an mild pain which is worsening with breathing and movement and also pressure. She denies any complaint of orthopnea PND leg swelling shortness of breath at present. She also noted bilateral blurring of vision that started at the same time that the pain. The blurring has improved at present it was painless she denies any pain with movement of the eye. She denies any other focal neurological deficit. TODAY troponin x  3 negative, echocardiogram not pathologic see results below;     Procedure CT head without contrast 12/09/2012 Findings: The brain has a normal appearance without evidence for  hemorrhage, infarction, hydrocephalus, or mass lesion. There is no  extra axial fluid collection. The skull and paranasal sinuses are  Normal.  Echocardiogram 12/10/2012  - Left ventricle: The cavity size was normal. Wall thickness was increased in a pattern of mild LVH. Systolic function was normal. The estimated ejection fraction was in the range of 55% to 60%. Wall motion was normal; there were no regional wall motion abnormalities. - Mitral valve: Mildly to moderately calcified annulus. Mild regurgitation. - Left  atrium: The atrium was mildly dilated. - Right ventricle: The cavity size was mildly dilated. Wall thickness was normal. - Right atrium: The atrium was mildly dilated.      Past Medical History  Diagnosis Date  . History of melanoma excision RIGHT LEG  --  2012  . 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 Anxiety   . 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 Cervical biopsy  w/ loop electrode excision  2005  . Svt-cardiac ablation    . Breast surgery  1999    BREAST MASS EXCISED  . Tubal ligation    . Colposcopy    . Melanoma excised    . Laparoscopic cholecystectomy  05-16-2005  . Cardiac electrophysiology study and ablation  1997    SUCCESSFUL ABLATION OF WOLFE-PARKINSON-WHITE SYNDROME  (NO ISSUES SINCE)  . Hysteroscopy w/d&c  02/13/2012    Procedure: DILATATION AND CURETTAGE /HYSTEROSCOPY;  Surgeon: Trellis Paganini, MD;  Location: [facility];  Service: Gynecology;  Laterality: N/A;  deficit - 710  . Dilation and curettage of uterus    . Hysteroscopy     Social History:  reports that she has never smoked. She has never used smokeless tobacco. She reports that  drinks alcohol. She reports that she does not use illicit drugs. Patient is coming from home. Independent for most of her  ADL.  Allergies  Allergen Reactions  . Azithromycin Diarrhea and Other (See Comments)    HEART RACES AND CRAMPING  . Sulfa Antibiotics Hives  and Nausea Only    Family History  Problem Relation Age of Onset  . Cancer Mother     ESOPHAGEAL CANCER  . Diabetes Father   . Hyperlipidemia Father   . Heart disease Father   . Diabetes Brother   . Breast cancer Paternal Aunt     Age 61's  . Hyperlipidemia Paternal Grandmother   . Heart disease Paternal Grandmother   . Diabetes Paternal Grandfather   . Hyperlipidemia Paternal Grandfather   . Heart disease Paternal  Grandfather     Prior to Admission medications   Medication Sig Start Date End Date Taking? Authorizing Provider  ALPRAZolam Prudy Feeler) 0.5 MG tablet TAKE 1 TABLET AT BEDTIME AS NEEDED 11/02/12  Yes Dara Lords, MD  Calcium Carbonate-Vitamin D (CALCIUM-D PO) Take 1 tablet by mouth daily.    Yes Historical Provider, MD  Cholecalciferol (VITAMIN D) 2000 UNITS CAPS Take 1 capsule by mouth daily.    Yes Historical Provider, MD  hydroxypropyl methylcellulose (ISOPTO TEARS) 2.5 % ophthalmic solution Place 1 drop into both eyes daily as needed. For dry eyes   Yes Historical Provider, MD  Multiple Vitamin (MULTIVITAMIN) capsule Take 1 capsule by mouth daily.    Yes Historical Provider, MD  Omega-3 Fatty Acids (THE VERY FINEST FISH OIL) LIQD Take by mouth daily. ONE TABLESPOON DAILY   Yes Historical Provider, MD  Probiotic Product (PROBIOTIC PO) Take 2 capsules by mouth daily.    Yes Historical Provider, MD  sodium chloride (OCEAN) 0.65 % SOLN nasal spray Place 1 spray into the nose daily as needed for congestion.   Yes Historical Provider, MD    Physical Exam: Filed Vitals:   12/10/12 0744 12/10/12 1151 12/10/12 1153 12/10/12 1544  BP: 117/61 106/31 111/58 108/64  Pulse: 73 72  77  Temp: 97.8 F (36.6 C) 98.9 F (37.2 C)  98.1 F (36.7 C)  TempSrc: Oral Oral  Oral  Resp: 15 16  16   Height:      Weight:      SpO2: 99% 98%  98%    General: A/O x 4, NAD Neck: no JVD, no  Carotid Bruits  Cardiovascular: Regular rhythm and rate, negative murmurs rubs gallops S1 and S2 Present.  Respiratory: Clear to auscultation bilateral, negative Crackles,no wheezes Abdomen: Bowel Sound Present, Soft, nontender  Extremities: no Pedal edema, no calf tenderness   Labs on Admission:  CBC:  Recent Labs Lab 12/09/12 2035 12/10/12 0413  WBC 7.9 6.6  NEUTROABS 4.5 3.1  HGB 14.0 12.8  HCT 40.0 36.4  MCV 93.9 93.1  PLT 282 245    CMP     Component Value Date/Time   NA 142 12/10/2012 0413   K  3.8 12/10/2012 0413   CL 108 12/10/2012 0413   CO2 24 12/10/2012 0413   GLUCOSE 95 12/10/2012 0413   BUN 12 12/10/2012 0413   CREATININE 0.71 12/10/2012 0413   CREATININE 0.83 08/22/2011 1530   CALCIUM 9.0 12/10/2012 0413   PROT 6.6 12/10/2012 0413   ALBUMIN 3.5 12/10/2012 0413   AST 14 12/10/2012 0413   ALT 10 12/10/2012 0413   ALKPHOS 75 12/10/2012 0413   BILITOT 0.3 12/10/2012 0413   GFRNONAA 89* 12/10/2012 0413   GFRAA >90 12/10/2012 0413     Recent Labs Lab 12/09/12 2035  LIPASE 22   No results found for this basename: AMMONIA,  in the last 168 hours  Cardiac Enzymes:  Recent Labs Lab 12/10/12 0413 12/10/12 0449 12/10/12 1120 12/10/12 1616  TROPONINI <0.30 <0.30 <0.30 <0.30    BNP (last 3 results) No results found for this basename: PROBNP,  in the last 8760 hours  Radiological Exams on Admission: Dg Chest 2 View  12/09/2012   *RADIOLOGY REPORT*  Clinical Data: Chest pain under the left breast and radiating to the right side bending to the mid chest with shortness of breath. Pain occurred after a CT abdomen with contrast today.  CHEST - 2 VIEW  Comparison: 05/16/2005  Findings: Slightly shallow inspiration. The heart size and pulmonary vascularity are normal. The lungs appear clear and expanded without focal air space disease or consolidation. No blunting of the costophrenic angles.  No pneumothorax.  Mediastinal contours appear intact.  Degenerative changes in the thoracic spine.  Surgical clips in the right upper quadrant.  No significant changes since the previous study.  IMPRESSION: No evidence of active pulmonary disease.   Original Report Authenticated By: Burman Nieves, M.D.   Ct Head Wo Contrast  12/10/2012   *RADIOLOGY REPORT*  Clinical Data: Headache  CT HEAD WITHOUT CONTRAST  Technique:  Contiguous axial images were obtained from the base of the skull through the vertex without contrast.  Comparison: None.  Findings: The brain has a normal appearance without evidence  for hemorrhage, infarction, hydrocephalus, or mass lesion.  There is no extra axial fluid collection.  The skull and paranasal sinuses are normal.  IMPRESSION:  Negative exam.   Original Report Authenticated By: Signa Kell, M.D.    EKG: Comparison EKG 02/13/2012 (changes) 1. Sinus rhythm with sinus arrhythmia, 2. Nonspecific ST-T wave changes in V1 through V6  Assessment/Plan Principal Problem:   Chest pain Active Problems:   Blurred vision, bilateral   Hematuria   Anxiety state, unspecified   1. Chest pain The patient appear to have atypical chest pain, troponins x3 negative, EKG nonpathologic, echocardiogram some mild diastolic dysfunction but nothing to explain patient's pain. If pain were to return in the future patient's PCP should arrange for cardiac stress test cardiology. --Patient needs to obtain PCP   2. blurring of the vision Patient has bilateral blurring of the vision which is likely secondary to poor oral intake prior to her CT scan. She has a negative CT of the head in the ER and currently does not have any focal neurological deficit. With this we will be continuing neuro checks. We will also check carotid duplex to rule out any stenosis (preliminary read bilateral carotid arteries within normal limits).  3. Anxiety Continue Xanax  DVT Prophylaxis: subcutaneous Heparin Nutrition: Cardiac diet   Code Status: Full     Author: Carolyne Littles MD [facility] Pager: [phone] 12/10/2012, 5:13 PM    If 7PM-7AM, please contact night-coverage [email or web address] Password [email or web address]

## 2012-12-10 NOTE — Progress Notes (Signed)
Bilateral carotid artery duplex:  1-39% ICA stenosis.  Vertebral artery flow is antegrade.     

## 2012-12-10 NOTE — ED Notes (Signed)
Pt daughter is going home to sleep for a few hours. Daughter wants to leave her info so she can be contacted with any update. Anna [name] [phone]  If she does not answer you she said please a v-mail.

## 2012-12-11 ENCOUNTER — Telehealth (HOSPITAL_COMMUNITY): Payer: Self-pay | Admitting: Emergency Medicine

## 2012-12-11 NOTE — Telephone Encounter (Signed)
Pt called very upset about not seeing a cardiologist while she was admitted for chest pain. Reviewed chart with pt over the phone and pointed out that Dr Viann Fish read her 2D-echocardiogram. Her discharge summary included a recommendation that pt get a pcp and get a referral to a cardiologist for a stress test. Pt hasn't had a stress test since 1996 when she had ablation done for Wolf-Parkinson-White syndrome. Also assisted pt with finding why her "my chart" wouldn't work. The wrong ssn was in the system and referred her to registration to get that corrected.

## 2012-12-11 NOTE — ED Provider Notes (Signed)
Medical screening examination/treatment/procedure(s) were conducted as a shared visit with non-physician practitioner(s) and myself.  I personally evaluated the patient during the encounter  Please see my separate respective documentation pertaining to this patient encounter   Vida Roller, MD 12/11/12 [phone]

## 2013-02-04 ENCOUNTER — Other Ambulatory Visit: Payer: Self-pay

## 2014-01-27 ENCOUNTER — Emergency Department: Payer: Self-pay | Admitting: Student

## 2014-01-31 ENCOUNTER — Encounter (HOSPITAL_COMMUNITY): Payer: Self-pay

## 2014-03-11 ENCOUNTER — Encounter: Payer: Self-pay | Admitting: Gynecology

## 2014-04-19 ENCOUNTER — Encounter: Payer: Self-pay | Admitting: Gynecology

## 2014-04-19 ENCOUNTER — Ambulatory Visit (INDEPENDENT_AMBULATORY_CARE_PROVIDER_SITE_OTHER): Payer: Medicare Other | Admitting: Gynecology

## 2014-04-19 VITALS — BP 124/84 | Ht 66.0 in | Wt 194.0 lb

## 2014-04-19 DIAGNOSIS — Z13820 Encounter for screening for osteoporosis: Secondary | ICD-10-CM | POA: Diagnosis not present

## 2014-04-19 DIAGNOSIS — Z01419 Encounter for gynecological examination (general) (routine) without abnormal findings: Secondary | ICD-10-CM | POA: Diagnosis not present

## 2014-04-19 DIAGNOSIS — N952 Postmenopausal atrophic vaginitis: Secondary | ICD-10-CM

## 2014-04-19 DIAGNOSIS — K649 Unspecified hemorrhoids: Secondary | ICD-10-CM | POA: Diagnosis not present

## 2014-04-19 MED ORDER — ALPRAZOLAM 0.5 MG PO TABS: ORAL_TABLET | ORAL | Status: DC

## 2014-04-19 MED ORDER — HYDROCORTISONE ACETATE 25 MG RE SUPP: 25.0000 mg | Freq: Two times a day (BID) | RECTAL | Status: DC

## 2014-04-19 NOTE — Patient Instructions (Signed)
Follow up for bone density is scheduled.  You may obtain a copy of any labs that were done today by logging onto MyChart as outlined in the instructions provided with your AVS (after visit summary). The office will not call with normal lab results but certainly if there are any significant abnormalities then we will contact you.   Health Maintenance, Female A healthy lifestyle and preventative care can promote health and wellness.  Maintain regular health, dental, and eye exams.  Eat a healthy diet. Foods like vegetables, fruits, whole grains, low-fat dairy products, and lean protein foods contain the nutrients you need without too many calories. Decrease your intake of foods high in solid fats, added sugars, and salt. Get information about a proper diet from your caregiver, if necessary.  Regular physical exercise is one of the most important things you can do for your health. Most adults should get at least 150 minutes of moderate-intensity exercise (any activity that increases your heart rate and causes you to sweat) each week. In addition, most adults need muscle-strengthening exercises on 2 or more days a week.   Maintain a healthy weight. The body mass index (BMI) is a screening tool to identify possible weight problems. It provides an estimate of body fat based on height and weight. Your caregiver can help determine your BMI, and can help you achieve or maintain a healthy weight. For adults 20 years and older:  A BMI below 18.5 is considered underweight.  A BMI of 18.5 to 24.9 is normal.  A BMI of 25 to 29.9 is considered overweight.  A BMI of 30 and above is considered obese.  Maintain normal blood lipids and cholesterol by exercising and minimizing your intake of saturated fat. Eat a balanced diet with plenty of fruits and vegetables. Blood tests for lipids and cholesterol should begin at age 44 and be repeated every 5 years. If your lipid or cholesterol levels are high, you are over  50, or you are a high risk for heart disease, you may need your cholesterol levels checked more frequently.Ongoing high lipid and cholesterol levels should be treated with medicines if diet and exercise are not effective.  If you smoke, find out from your caregiver how to quit. If you do not use tobacco, do not start.  Lung cancer screening is recommended for adults aged 3 80 years who are at high risk for developing lung cancer because of a history of smoking. Yearly low-dose computed tomography (CT) is recommended for people who have at least a 30-pack-year history of smoking and are a current smoker or have quit within the past 15 years. A pack year of smoking is smoking an average of 1 pack of cigarettes a day for 1 year (for example: 1 pack a day for 30 years or 2 packs a day for 15 years). Yearly screening should continue until the smoker has stopped smoking for at least 15 years. Yearly screening should also be stopped for people who develop a health problem that would prevent them from having lung cancer treatment.  If you are pregnant, do not drink alcohol. If you are breastfeeding, be very cautious about drinking alcohol. If you are not pregnant and choose to drink alcohol, do not exceed 1 drink per day. One drink is considered to be 12 ounces (355 mL) of beer, 5 ounces (148 mL) of wine, or 1.5 ounces (44 mL) of liquor.  Avoid use of street drugs. Do not share needles with anyone. Ask for help  if you need support or instructions about stopping the use of drugs.  High blood pressure causes heart disease and increases the risk of stroke. Blood pressure should be checked at least every 1 to 2 years. Ongoing high blood pressure should be treated with medicines, if weight loss and exercise are not effective.  If you are 55 to 68 years old, ask your caregiver if you should take aspirin to prevent strokes.  Diabetes screening involves taking a blood sample to check your fasting blood sugar level.  This should be done once every 3 years, after age 45, if you are within normal weight and without risk factors for diabetes. Testing should be considered at a younger age or be carried out more frequently if you are overweight and have at least 1 risk factor for diabetes.  Breast cancer screening is essential preventative care for women. You should practice "breast self-awareness." This means understanding the normal appearance and feel of your breasts and may include breast self-examination. Any changes detected, no matter how small, should be reported to a caregiver. Women in their 20s and 30s should have a clinical breast exam (CBE) by a caregiver as part of a regular health exam every 1 to 3 years. After age 40, women should have a CBE every year. Starting at age 40, women should consider having a mammogram (breast X-ray) every year. Women who have a family history of breast cancer should talk to their caregiver about genetic screening. Women at a high risk of breast cancer should talk to their caregiver about having an MRI and a mammogram every year.  Breast cancer gene (BRCA)-related cancer risk assessment is recommended for women who have family members with BRCA-related cancers. BRCA-related cancers include breast, ovarian, tubal, and peritoneal cancers. Having family members with these cancers may be associated with an increased risk for harmful changes (mutations) in the breast cancer genes BRCA1 and BRCA2. Results of the assessment will determine the need for genetic counseling and BRCA1 and BRCA2 testing.  The Pap test is a screening test for cervical cancer. Women should have a Pap test starting at age 21. Between ages 21 and 29, Pap tests should be repeated every 2 years. Beginning at age 30, you should have a Pap test every 3 years as long as the past 3 Pap tests have been normal. If you had a hysterectomy for a problem that was not cancer or a condition that could lead to cancer, then you no  longer need Pap tests. If you are between ages 65 and 70, and you have had normal Pap tests going back 10 years, you no longer need Pap tests. If you have had past treatment for cervical cancer or a condition that could lead to cancer, you need Pap tests and screening for cancer for at least 20 years after your treatment. If Pap tests have been discontinued, risk factors (such as a new sexual partner) need to be reassessed to determine if screening should be resumed. Some women have medical problems that increase the chance of getting cervical cancer. In these cases, your caregiver may recommend more frequent screening and Pap tests.  The human papillomavirus (HPV) test is an additional test that may be used for cervical cancer screening. The HPV test looks for the virus that can cause the cell changes on the cervix. The cells collected during the Pap test can be tested for HPV. The HPV test could be used to screen women aged 30 years and older, and   should be used in women of any age who have unclear Pap test results. After the age of 30, women should have HPV testing at the same frequency as a Pap test.  Colorectal cancer can be detected and often prevented. Most routine colorectal cancer screening begins at the age of 50 and continues through age 75. However, your caregiver may recommend screening at an earlier age if you have risk factors for colon cancer. On a yearly basis, your caregiver may provide home test kits to check for hidden blood in the stool. Use of a small camera at the end of a tube, to directly examine the colon (sigmoidoscopy or colonoscopy), can detect the earliest forms of colorectal cancer. Talk to your caregiver about this at age 50, when routine screening begins. Direct examination of the colon should be repeated every 5 to 10 years through age 75, unless early forms of pre-cancerous polyps or small growths are found.  Hepatitis C blood testing is recommended for all people born from  1945 through 1965 and any individual with known risks for hepatitis C.  Practice safe sex. Use condoms and avoid high-risk sexual practices to reduce the spread of sexually transmitted infections (STIs). Sexually active women aged 25 and younger should be checked for Chlamydia, which is a common sexually transmitted infection. Older women with new or multiple partners should also be tested for Chlamydia. Testing for other STIs is recommended if you are sexually active and at increased risk.  Osteoporosis is a disease in which the bones lose minerals and strength with aging. This can result in serious bone fractures. The risk of osteoporosis can be identified using a bone density scan. Women ages 65 and over and women at risk for fractures or osteoporosis should discuss screening with their caregivers. Ask your caregiver whether you should be taking a calcium supplement or vitamin D to reduce the rate of osteoporosis.  Menopause can be associated with physical symptoms and risks. Hormone replacement therapy is available to decrease symptoms and risks. You should talk to your caregiver about whether hormone replacement therapy is right for you.  Use sunscreen. Apply sunscreen liberally and repeatedly throughout the day. You should seek shade when your shadow is shorter than you. Protect yourself by wearing long sleeves, pants, a wide-brimmed hat, and sunglasses year round, whenever you are outdoors.  Notify your caregiver of new moles or changes in moles, especially if there is a change in shape or color. Also notify your caregiver if a mole is larger than the size of a pencil eraser.  Stay current with your immunizations. Document Released: 10/01/2010 Document Revised: 07/13/2012 Document Reviewed: 10/01/2010 [employer] Patient Information 2014 [employer].   

## 2014-04-19 NOTE — Progress Notes (Signed)
Anna [name] 1945-06-12 [id number]        69 y.o.  G2P2002 for breast and pelvic exam. Several issues noted below.  Past medical history,surgical history, problem list, medications, allergies, family history and social history were all reviewed and documented as reviewed in the EPIC chart.  ROS:  Performed with pertinent positives and negatives included in the history, assessment and plan.   Additional significant findings :  none   Exam: Kim Counsellor Vitals:   04/19/14 1537  BP: 124/84  Height: 5\' 6"  (1.676 m)  Weight: 194 lb (87.998 kg)   General appearance:  Normal affect, orientation and appearance. Skin: Grossly normal HEENT: Without gross lesions.  No cervical or supraclavicular adenopathy. Thyroid normal.  Lungs:  Clear without wheezing, rales or rhonchi Cardiac: RR, without RMG Abdominal:  Soft, nontender, without masses, guarding, rebound, organomegaly or hernia Breasts:  Examined lying and sitting without masses, retractions, discharge or axillary adenopathy. Pelvic:  Ext/BUS/vagina with generalized atrophic changes  Cervix with atrophic changes  Uterus axial to anteverted, normal size, shape and contour, midline and mobile nontender   Adnexa  Without masses or tenderness    Anus and perineum  With external hemorrhoids  Rectovaginal  Normal sphincter tone without palpated masses or tenderness.    Assessment/Plan:  69 y.o. G72P2002 female for breast and pelvic exam.   1. Atrophic genital changes/postmenopausal. Patient without significant symptoms of hot flashes, night sweats, vaginal dryness, is not sexually active. No vaginal bleeding. Continue to monitor and report any vaginal bleeding. 2. Pap smear 2014. No Pap smear done today. I again reviewed current screening guidelines and options to stop screening altogether or less frequent screening intervals reviewed. Will readdress on an annual basis. 3. Mammography 03/2014. Continue with annual mammography. SBE monthly  reviewed. 4. Colonoscopy 2013. Repeat at their recommended interval. 5. DEXA 2008 normal. I thought she had a prior DEXA but she reports only having one DEXA. Schedule repeat DEXA this year and patient will follow up for this. Increased calcium and vitamin D reviewed. 6. Insomnia. Patient uses Xanax 5 mg intermittently as a sleep aid. Has done for years and requests a refill. Xanax 5 mg #30 with 5 refills provided. 7. External hemorrhoids. Patient was prescribed Anusol 25 mg suppositories by her gastroenterologist and asked if I could give her a refill. Anusol 25 mg suppositories #12 with 3 refills provided. 8. Health maintenance. No routine blood work done as she reports this done at her primary physician's office. Follow up one year, sooner as needed.     Anastasio Auerbach MD, 4:05 PM 04/19/2014

## 2014-04-20 LAB — URINALYSIS W MICROSCOPIC + REFLEX CULTURE
Bacteria, UA: NONE SEEN
Bilirubin Urine: NEGATIVE
Casts: NONE SEEN
Glucose, UA: NEGATIVE mg/dL
Hgb urine dipstick: NEGATIVE
Ketones, ur: NEGATIVE mg/dL
Leukocytes, UA: NEGATIVE
Nitrite: NEGATIVE
Protein, ur: NEGATIVE mg/dL
Specific Gravity, Urine: 1.024 (ref 1.005–1.030)
Squamous Epithelial / LPF: NONE SEEN
Urobilinogen, UA: 0.2 mg/dL (ref 0.0–1.0)
pH: 5.5 (ref 5.0–8.0)

## 2014-04-27 DIAGNOSIS — M1990 Unspecified osteoarthritis, unspecified site: Secondary | ICD-10-CM | POA: Insufficient documentation

## 2014-04-27 DIAGNOSIS — E785 Hyperlipidemia, unspecified: Secondary | ICD-10-CM | POA: Insufficient documentation

## 2014-07-04 ENCOUNTER — Other Ambulatory Visit: Payer: Self-pay | Admitting: Gynecology

## 2014-07-04 ENCOUNTER — Ambulatory Visit (INDEPENDENT_AMBULATORY_CARE_PROVIDER_SITE_OTHER): Payer: Medicare Other

## 2014-07-04 DIAGNOSIS — Z780 Asymptomatic menopausal state: Secondary | ICD-10-CM

## 2014-07-04 DIAGNOSIS — Z13820 Encounter for screening for osteoporosis: Secondary | ICD-10-CM

## 2014-09-26 ENCOUNTER — Other Ambulatory Visit: Payer: Self-pay

## 2014-10-31 ENCOUNTER — Inpatient Hospital Stay
Admission: EM | Admit: 2014-10-31 | Discharge: 2014-11-02 | DRG: 872 | Disposition: A | Discharge status: Home / Self Care | Payer: Medicare Other | Attending: Specialist | Admitting: Specialist

## 2014-10-31 ENCOUNTER — Emergency Department: Payer: Medicare Other

## 2014-10-31 DIAGNOSIS — K219 Gastro-esophageal reflux disease without esophagitis: Secondary | ICD-10-CM | POA: Diagnosis present

## 2014-10-31 DIAGNOSIS — Z79899 Other long term (current) drug therapy: Secondary | ICD-10-CM

## 2014-10-31 DIAGNOSIS — Z9851 Tubal ligation status: Secondary | ICD-10-CM | POA: Diagnosis not present

## 2014-10-31 DIAGNOSIS — I248 Other forms of acute ischemic heart disease: Secondary | ICD-10-CM | POA: Diagnosis present

## 2014-10-31 DIAGNOSIS — Z800 Family history of malignant neoplasm of digestive organs: Secondary | ICD-10-CM | POA: Diagnosis not present

## 2014-10-31 DIAGNOSIS — R079 Chest pain, unspecified: Secondary | ICD-10-CM

## 2014-10-31 DIAGNOSIS — Z79891 Long term (current) use of opiate analgesic: Secondary | ICD-10-CM | POA: Diagnosis not present

## 2014-10-31 DIAGNOSIS — Z9049 Acquired absence of other specified parts of digestive tract: Secondary | ICD-10-CM | POA: Diagnosis present

## 2014-10-31 DIAGNOSIS — Z8249 Family history of ischemic heart disease and other diseases of the circulatory system: Secondary | ICD-10-CM | POA: Diagnosis not present

## 2014-10-31 DIAGNOSIS — Z87442 Personal history of urinary calculi: Secondary | ICD-10-CM | POA: Diagnosis not present

## 2014-10-31 DIAGNOSIS — Z833 Family history of diabetes mellitus: Secondary | ICD-10-CM

## 2014-10-31 DIAGNOSIS — I471 Supraventricular tachycardia, unspecified: Secondary | ICD-10-CM

## 2014-10-31 DIAGNOSIS — G20 Parkinson's disease: Secondary | ICD-10-CM | POA: Diagnosis present

## 2014-10-31 DIAGNOSIS — Z882 Allergy status to sulfonamides status: Secondary | ICD-10-CM | POA: Diagnosis not present

## 2014-10-31 DIAGNOSIS — Z883 Allergy status to other anti-infective agents status: Secondary | ICD-10-CM

## 2014-10-31 DIAGNOSIS — Z803 Family history of malignant neoplasm of breast: Secondary | ICD-10-CM

## 2014-10-31 DIAGNOSIS — R509 Fever, unspecified: Secondary | ICD-10-CM

## 2014-10-31 DIAGNOSIS — F419 Anxiety disorder, unspecified: Secondary | ICD-10-CM | POA: Diagnosis present

## 2014-10-31 DIAGNOSIS — Z85820 Personal history of malignant melanoma of skin: Secondary | ICD-10-CM

## 2014-10-31 DIAGNOSIS — A419 Sepsis, unspecified organism: Principal | ICD-10-CM | POA: Diagnosis present

## 2014-10-31 HISTORY — DX: Supraventricular tachycardia: I47.1

## 2014-10-31 HISTORY — DX: Supraventricular tachycardia, unspecified: I47.10

## 2014-10-31 HISTORY — DX: Malignant (primary) neoplasm, unspecified: C80.1

## 2014-10-31 LAB — BASIC METABOLIC PANEL
Anion gap: 9 (ref 5–15)
BUN: 11 mg/dL (ref 6–20)
CO2: 25 mmol/L (ref 22–32)
Calcium: 9.2 mg/dL (ref 8.9–10.3)
Chloride: 104 mmol/L (ref 101–111)
Creatinine, Ser: 0.76 mg/dL (ref 0.44–1.00)
GFR calc Af Amer: >60 mL/min (ref >60)
GFR calc non Af Amer: >60 mL/min (ref >60)
Glucose, Bld: 117 mg/dL — ABNORMAL HIGH (ref 65–99)
Potassium: 3.7 mmol/L (ref 3.5–5.1)
Sodium: 138 mmol/L (ref 135–145)

## 2014-10-31 LAB — CBC
HCT: 39.3 % (ref 35.0–47.0)
HCT: 43 % (ref 35.0–47.0)
Hemoglobin: 13 g/dL (ref 12.0–16.0)
Hemoglobin: 14.1 g/dL (ref 12.0–16.0)
MCH: 31.2 pg (ref 26.0–34.0)
MCH: 31.6 pg (ref 26.0–34.0)
MCHC: 33.1 g/dL (ref 32.0–36.0)
MCHC: 32.8 g/dL (ref 32.0–36.0)
MCV: 94.4 fL (ref 80.0–100.0)
MCV: 96.1 fL (ref 80.0–100.0)
Platelets: 239 10*3/uL (ref 150–440)
Platelets: 233 10*3/uL (ref 150–440)
RBC: 4.16 MIL/uL (ref 3.80–5.20)
RBC: 4.47 MIL/uL (ref 3.80–5.20)
RDW: 13.2 % (ref 11.5–14.5)
RDW: 13.6 % (ref 11.5–14.5)
WBC: 13.8 10*3/uL — ABNORMAL HIGH (ref 3.6–11.0)
WBC: 14.7 10*3/uL — ABNORMAL HIGH (ref 3.6–11.0)

## 2014-10-31 LAB — CREATININE, SERUM
Creatinine, Ser: 0.82 mg/dL (ref 0.44–1.00)
GFR calc Af Amer: >60 mL/min (ref >60)
GFR calc non Af Amer: >60 mL/min (ref >60)

## 2014-10-31 LAB — URINALYSIS COMPLETE WITH MICROSCOPIC (ARMC ONLY)
Bacteria, UA: NONE SEEN
Bilirubin Urine: NEGATIVE
Glucose, UA: NEGATIVE mg/dL
Hgb urine dipstick: NEGATIVE
Ketones, ur: NEGATIVE mg/dL
Leukocytes, UA: NEGATIVE
Nitrite: NEGATIVE
Protein, ur: NEGATIVE mg/dL
RBC / HPF: NONE SEEN RBC/hpf (ref 0–5)
Specific Gravity, Urine: 1.004 — ABNORMAL LOW (ref 1.005–1.030)
pH: 7 (ref 5.0–8.0)

## 2014-10-31 LAB — TROPONIN I
Troponin I: 0.06 ng/mL — ABNORMAL HIGH (ref <0.031)
Troponin I: 0.04 ng/mL — ABNORMAL HIGH (ref <0.031)

## 2014-10-31 MED ORDER — HYDROCORTISONE ACETATE 25 MG RE SUPP
25.0000 mg | Freq: Two times a day (BID) | RECTAL | Status: DC | PRN
  Filled 2014-10-31: qty 1

## 2014-10-31 MED ORDER — ACETAMINOPHEN 325 MG PO TABS
650.0000 mg | ORAL_TABLET | Freq: Four times a day (QID) | ORAL | Status: DC | PRN
  Administered 2014-10-31 – 2014-11-01 (×2): 650 mg via ORAL
  Filled 2014-10-31 (×2): qty 2

## 2014-10-31 MED ORDER — PIPERACILLIN-TAZOBACTAM 3.375 G IVPB
3.3750 g | Freq: Once | INTRAVENOUS | Status: AC
  Administered 2014-10-31: 3.375 g via INTRAVENOUS

## 2014-10-31 MED ORDER — ACETAMINOPHEN 500 MG PO TABS
ORAL_TABLET | ORAL | Status: AC
  Administered 2014-10-31: 1000 mg via ORAL
  Filled 2014-10-31: qty 2

## 2014-10-31 MED ORDER — PIPERACILLIN-TAZOBACTAM 3.375 G IVPB
3.3750 g | Freq: Three times a day (TID) | INTRAVENOUS | Status: DC
  Administered 2014-10-31 – 2014-11-02 (×5): 3.375 g via INTRAVENOUS
  Filled 2014-10-31 (×9): qty 50

## 2014-10-31 MED ORDER — SODIUM CHLORIDE 0.9 % IV BOLUS (SEPSIS)
1000.0000 mL | Freq: Once | INTRAVENOUS | Status: AC
  Administered 2014-10-31: 1000 mL via INTRAVENOUS

## 2014-10-31 MED ORDER — ONDANSETRON HCL 4 MG PO TABS: 4.0000 mg | ORAL_TABLET | Freq: Four times a day (QID) | ORAL | Status: DC | PRN

## 2014-10-31 MED ORDER — VANCOMYCIN HCL IN DEXTROSE 1-5 GM/200ML-% IV SOLN
INTRAVENOUS | Status: AC
  Administered 2014-10-31: 1000 mg via INTRAVENOUS
  Filled 2014-10-31: qty 200

## 2014-10-31 MED ORDER — PIPERACILLIN-TAZOBACTAM 3.375 G IVPB
3.3750 g | Freq: Three times a day (TID) | INTRAVENOUS | Status: DC
  Filled 2014-10-31 (×4): qty 50

## 2014-10-31 MED ORDER — VANCOMYCIN HCL IN DEXTROSE 1-5 GM/200ML-% IV SOLN
1000.0000 mg | Freq: Once | INTRAVENOUS | Status: AC
  Administered 2014-10-31: 1000 mg via INTRAVENOUS
  Filled 2014-10-31: qty 200

## 2014-10-31 MED ORDER — PIPERACILLIN SOD-TAZOBACTAM SO 3.375 (3-0.375) G IV SOLR
INTRAVENOUS | Status: AC
  Filled 2014-10-31: qty 3.38

## 2014-10-31 MED ORDER — POTASSIUM CHLORIDE IN NACL 20-0.9 MEQ/L-% IV SOLN
INTRAVENOUS | Status: DC
  Administered 2014-10-31 – 2014-11-01 (×2): via INTRAVENOUS
  Filled 2014-10-31 (×2): qty 1000

## 2014-10-31 MED ORDER — ALPRAZOLAM 0.5 MG PO TABS
0.5000 mg | ORAL_TABLET | Freq: Every evening | ORAL | Status: DC | PRN
  Administered 2014-10-31 – 2014-11-01 (×2): 0.5 mg via ORAL
  Filled 2014-10-31 (×2): qty 1

## 2014-10-31 MED ORDER — FLUTICASONE PROPIONATE 50 MCG/ACT NA SUSP
1.0000 | Freq: Every day | NASAL | Status: DC | PRN
  Filled 2014-10-31: qty 16

## 2014-10-31 MED ORDER — LORATADINE 10 MG PO TABS
10.0000 mg | ORAL_TABLET | Freq: Every day | ORAL | Status: DC
  Administered 2014-10-31 – 2014-11-02 (×3): 10 mg via ORAL
  Filled 2014-10-31 (×3): qty 1

## 2014-10-31 MED ORDER — ONDANSETRON HCL 4 MG/2ML IJ SOLN: 4.0000 mg | Freq: Four times a day (QID) | INTRAMUSCULAR | Status: DC | PRN

## 2014-10-31 MED ORDER — ACETAMINOPHEN 500 MG PO TABS
1000.0000 mg | ORAL_TABLET | Freq: Once | ORAL | Status: AC
  Administered 2014-10-31: 1000 mg via ORAL

## 2014-10-31 MED ORDER — VANCOMYCIN HCL IN DEXTROSE 1-5 GM/200ML-% IV SOLN
1000.0000 mg | Freq: Two times a day (BID) | INTRAVENOUS | Status: DC
  Administered 2014-11-01: 1000 mg via INTRAVENOUS
  Filled 2014-10-31 (×3): qty 200

## 2014-10-31 MED ORDER — SENNOSIDES-DOCUSATE SODIUM 8.6-50 MG PO TABS: 1.0000 | ORAL_TABLET | Freq: Every evening | ORAL | Status: DC | PRN

## 2014-10-31 MED ORDER — ENOXAPARIN SODIUM 40 MG/0.4ML ~~LOC~~ SOLN
40.0000 mg | SUBCUTANEOUS | Status: DC
  Administered 2014-10-31 – 2014-11-01 (×2): 40 mg via SUBCUTANEOUS
  Filled 2014-10-31 (×2): qty 0.4

## 2014-10-31 MED ORDER — ACETAMINOPHEN 650 MG RE SUPP: 650.0000 mg | Freq: Four times a day (QID) | RECTAL | Status: DC | PRN

## 2014-10-31 MED ORDER — DILTIAZEM HCL 25 MG/5ML IV SOLN
INTRAVENOUS | Status: AC
  Administered 2014-10-31: 10 mg
  Filled 2014-10-31: qty 5

## 2014-10-31 MED ORDER — FAMOTIDINE 20 MG PO TABS
20.0000 mg | ORAL_TABLET | Freq: Every day | ORAL | Status: DC
  Administered 2014-10-31 – 2014-11-02 (×3): 20 mg via ORAL
  Filled 2014-10-31 (×3): qty 1

## 2014-10-31 MED ORDER — VANCOMYCIN HCL IN DEXTROSE 1-5 GM/200ML-% IV SOLN
1000.0000 mg | Freq: Once | INTRAVENOUS | Status: AC
  Administered 2014-10-31: 1000 mg via INTRAVENOUS

## 2014-10-31 MED ORDER — PIPERACILLIN-TAZOBACTAM 3.375 G IVPB 30 MIN: 3.3750 g | Freq: Three times a day (TID) | INTRAVENOUS | Status: DC

## 2014-10-31 NOTE — ED Notes (Signed)
Pt woke up at 0530 this morning with dizziness, left sided head pain, left shoulder pain, nausea and fever. Pt with HR 200 in triage.

## 2014-10-31 NOTE — Progress Notes (Signed)
Patient alert and oriented x4. Oriented to room, unit, and call bell. Admission completed. No complaints at this time. Will cont to assess. Skin assessment verified by Jessica Christmas, RN. Telemetry box verified. Anna [name]   

## 2014-10-31 NOTE — H&P (Signed)
[facility] NAME: Anna [name]    MR#:  [id number]  DATE OF BIRTH:  14-Sep-1945  DATE OF ADMISSION:  10/31/2014  PRIMARY CARE PHYSICIAN: Kirk Ruths., MD   REQUESTING/REFERRING PHYSICIAN: Paduchowski, MD   fever and tachycardia HISTORY OF PRESENT ILLNESS:  Anna [name]  is a 69 y.o. female with a known history of anxiety, cardiac ablation in 1996 for Anna [name]-  Parkinson-White [name] presents to the emergency department with dizziness, lightheadedness, nausea and fever. At the time of arrival to the emergency department her heart rate was approximately at around 2 10 bpm and she was febrile with 102 Tmperature .patient was given Cardizem IV which improved the heart rate.chest x-ray with no abnormalities. Patient was given IV fluids and started on empiric antibiotics  PAST MEDICAL HISTORY:   Past Medical History  Diagnosis Date  . History of melanoma excision RIGHT LEG  --  2012  . 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 Anxiety   . Kidney stone   . Cancer     PAST SURGICAL HISTOIRY:   Past Surgical History  Procedure Laterality Date  . Cervical biopsy  w/ loop electrode excision  2005  . Svt-cardiac ablation    . Breast surgery  1999    BREAST MASS EXCISED  . Tubal ligation    . Colposcopy    . Melanoma excised    . Laparoscopic cholecystectomy  05-16-2005  . Cardiac electrophysiology study and ablation  [place]  (NO ISSUES SINCE)  . Hysteroscopy w/d&c  02/13/2012    Procedure: DILATATION AND CURETTAGE /HYSTEROSCOPY;  Surgeon: Bennetta Laos, MD;  Location: [facility];  Service: Gynecology;  Laterality: N/A;  deficit - 710  . Dilation and curettage of uterus    . Hysteroscopy      SOCIAL HISTORY:   History  Substance Use Topics  . Smoking status: Never Smoker   . Smokeless  tobacco: Never Used  . Alcohol Use: 0.0 oz/week    0 Standard drinks or equivalent per week     Comment: rare    FAMILY HISTORY:   Family History  Problem Relation Age of Onset  . Cancer Mother     ESOPHAGEAL CANCER  . Diabetes Father   . Hyperlipidemia Father   . Heart disease Father   . Diabetes Brother   . Breast cancer Paternal Aunt     Age 56's  . Hyperlipidemia Paternal Grandmother   . Heart disease Paternal Grandmother   . Diabetes Paternal Grandfather   . Hyperlipidemia Paternal Grandfather   . Heart disease Paternal Grandfather     DRUG ALLERGIES:   Allergies  Allergen Reactions  . Sulfa Antibiotics Hives and Nausea Only  . Azithromycin Diarrhea, Palpitations and Other (See Comments)    Reaction:  Cramping     REVIEW OF SYSTEMS:  CONSTITUTIONAL: Reporting fever, fatigue, denies weakness.  EYES: No blurred or double vision.  EARS, NOSE, AND THROAT: No tinnitus or ear pain.  RESPIRATORY: No cough, shortness of breath, wheezing or hemoptysis.  CARDIOVASCULAR: No chest pain, orthopnea, edema. Reporting history of WPW  [name] GASTROINTESTINAL: No nausea, vomiting, diarrhea or abdominal pain.  GENITOURINARY: No dysuria, hematuria.  ENDOCRINE: No polyuria, nocturia,  HEMATOLOGY: No anemia, easy bruising or bleeding SKIN: No rash or lesion. MUSCULOSKELETAL: No joint pain or arthritis.  NEUROLOGIC: No tingling, numbness, weakness.  PSYCHIATRY: No anxiety or depression.   MEDICATIONS AT HOME:   Prior to Admission medications   Medication Sig Start Date End Date Taking? Authorizing Provider  ALPRAZolam (XANAX) 0.5 MG tablet TAKE 1 TABLET AT BEDTIME AS NEEDED Patient taking differently: Take 0.5 mg by mouth at bedtime as needed for anxiety.  04/19/14  Yes Anastasio Auerbach, MD  fexofenadine (ALLEGRA) 180 MG tablet Take 180 mg by mouth daily as needed for allergies.   Yes Historical Provider, MD  fluticasone (FLONASE) 50 MCG/ACT nasal spray Place 1 spray into  both nostrils daily as needed for rhinitis.    Yes Historical Provider, MD  hydrocortisone (ANUSOL-HC) 25 MG suppository Place 1 suppository (25 mg total) rectally 2 (two) times daily. Patient taking differently: Place 25 mg rectally 2 (two) times daily as needed for hemorrhoids.  04/19/14  Yes Anastasio Auerbach, MD  Polyethyl Glycol-Propyl Glycol (SYSTANE OP) Apply 1 drop to eye as needed (for dry eyes).   Yes Historical Provider, MD      VITAL SIGNS:  Blood pressure 127/56, pulse 89, temperature 97.6 F (36.4 C), temperature source Oral, resp. rate 19, height 5\' 8"  (1.727 m), weight 87.544 kg (193 lb), SpO2 99 %.  PHYSICAL EXAMINATION:  GENERAL:  69 y.o.-year-old patient lying in the bed with no acute distress.  EYES: Pupils equal, round, reactive to light and accommodation. No scleral icterus. Extraocular muscles intact.  HEENT: Head atraumatic, normocephalic. Oropharynx and nasopharynx clear.  NECK:  Supple, no jugular venous distention. No thyroid enlargement, no tenderness.  LUNGS: Normal breath sounds bilaterally, no wheezing, rales,rhonchi or crepitation. No use of accessory muscles of respiration.  CARDIOVASCULAR: S1, S2 normal. Tachycardic. No murmurs, rubs, or gallops.  ABDOMEN: Soft, nontender, nondistended. Bowel sounds present. No organomegaly or mass.  EXTREMITIES: No pedal edema, cyanosis, or clubbing.  NEUROLOGIC: Cranial nerves II through XII are intact. Muscle strength 5/5 in all extremities. Sensation intact. Gait not checked.  PSYCHIATRIC: The patient is alert and oriented x 3.  SKIN: No obvious rash, lesion, or ulcer.   LABORATORY PANEL:   CBC  Recent Labs Lab 10/31/14 1917  WBC 14.7*  HGB 14.1  HCT 43.0  PLT 233   ------------------------------------------------------------------------------------------------------------------  Chemistries   Recent Labs Lab 10/31/14 1235 10/31/14 1917  NA 138  --   K 3.7  --   CL 104  --   CO2 25  --   GLUCOSE  117*  --   BUN 11  --   CREATININE 0.76 0.82  CALCIUM 9.2  --    ------------------------------------------------------------------------------------------------------------------  Cardiac Enzymes  Recent Labs Lab 10/31/14 1917  TROPONINI 0.04*   ------------------------------------------------------------------------------------------------------------------  RADIOLOGY:  Dg Chest Port 1 View  10/31/2014   CLINICAL DATA:  Chest pain  EXAM: PORTABLE CHEST - 1 VIEW  COMPARISON:  None.  FINDINGS: Generous heart size but no cardiomegaly for technique. Negative aortic and hilar contours. There is no edema, consolidation, effusion, or pneumothorax. No osseous findings to explain chest pain  IMPRESSION: Negative portable chest.   Electronically Signed   By: Monte Fantasia M.D.   On: 10/31/2014 13:14    EKG:   Orders placed or performed during the hospital encounter of 10/31/14  . ED EKG within 10 minutes  . ED EKG within 10 minutes    IMPRESSION AND PLAN:   Ayva Veilleux  is a 69 y.o. female with a known history of anxiety, cardiac ablation in 1996 for Anna [name]  [name] presents to the emergency department with dizziness, lightheadedness, nausea and fever. At the time of arrival to the emergency department her heart rate was approximately at around 2 10 bpm and she was febrile with 102 Tmperature .patient was given Cardizem IV which improved the heart rate.   1. Sepsis with  fever, leukocytosis and tachycardia -unclear etiology   Blood cultures 2 and urine culture and sensitivity were ordered in the ED Patient is on empiric antibiotic Zosyn and vancomycin , we will narrow down the antibiotics based on the culture results Check CBC and BMP in a.m.  2 .SVT with a history of WPW [name] and status post cardiac ablation   monitor her on telemetry   this episodef SVT is probably from sepsis Consult is placed to [facility] cardiology Elevated troponin is probably secondary to  demand ischemia from #2 Cycle cardiac enzymes  3.chronic history of anxiety   will provide anxiolytic Xanax   as needed basis  4. History of melanoma Outpatient follow-up with dermatology as recommended   GI prophylaxis with Pepcid DVT prophylaxis with Lovenox subcutaneous        All the records are reviewed and case discussed with ED provider. Management plans discussed with the patient, family and they are in agreement.  CODE STATUS: Full code, daughter is the healthcare power of attorney TOTAL TIME TAKING CARE OF THIS PATIENT: Reviewing medical records, history and physical, admission orders and coronation of care-45 minutes.    Nicholes Mango M.D on 10/31/2014 at 9:06 PM  Between 7am to 6pm - Pager - [phone]  After 6pm go to [email or web address] - password [email or web address] [facility]  Office  [phone]  CC: Primary care physician; Kirk Ruths., MD

## 2014-10-31 NOTE — Progress Notes (Signed)
Patient stated that she does have her xanax with her. Explained to patient that medications need to be sent home with family or stored in pharmacy. Patient stated that her daughter was on her way and would give to her. Will pass on to night shift nurse, Kristine Garbe, since patient's daughter not at hospital yet.  Wilnette Kales

## 2014-10-31 NOTE — Progress Notes (Signed)
ANTIBIOTIC CONSULT NOTE - INITIAL  Pharmacy Consult for Vancomycin  Indication: Sepsis  Allergies  Allergen Reactions  . Sulfa Antibiotics Hives and Nausea Only  . Azithromycin Diarrhea, Palpitations and Other (See Comments)    Reaction:  Cramping     Patient Measurements: Height: 5\' 8"  (172.7 cm) Weight: 193 lb (87.544 kg) IBW/kg (Calculated) : 63.9 Adjusted Body Weight: 73.34 kg  Vital Signs: Temp: 98.2 F (36.8 C) (08/01 1805) Temp Source: Oral (08/01 1805) BP: 155/86 mmHg (08/01 1805) Pulse Rate: 101 (08/01 1805) Intake/Output from previous day:   Intake/Output from this shift:    Labs:  Recent Labs  10/31/14 1235 10/31/14 1511  WBC  --  13.8*  HGB  --  13.0  PLT  --  239  CREATININE 0.76  --    Estimated Creatinine Clearance: 79 mL/min (by C-G formula based on Cr of 0.76). No results for input(s): VANCOTROUGH, VANCOPEAK, VANCORANDOM, GENTTROUGH, GENTPEAK, GENTRANDOM, TOBRATROUGH, TOBRAPEAK, TOBRARND, AMIKACINPEAK, AMIKACINTROU, AMIKACIN in the last 72 hours.   Microbiology: No results found for this or any previous visit (from the past 720 hour(s)).  Medical History: Past Medical History  Diagnosis Date  . History of melanoma excision RIGHT LEG  --  2012  . 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 Anxiety   . Kidney stone   . Cancer     Medications:  Anti-infectives    Start     Dose/Rate Route Frequency Ordered Stop   11/01/14 0800  vancomycin (VANCOCIN) IVPB 1000 mg/200 mL premix     1,000 mg 200 mL/hr over 60 Minutes Intravenous Every 12 hours 10/31/14 1855     10/31/14 2200  piperacillin-tazobactam (ZOSYN) IVPB 3.375 g     3.375 g 12.5 mL/hr over 240 Minutes Intravenous 3 times per day 10/31/14 1748     10/31/14 2000  vancomycin (VANCOCIN) IVPB 1000 mg/200 mL premix     1,000 mg 200 mL/hr over 60 Minutes Intravenous  Once 10/31/14 1855     10/31/14 1745  piperacillin-tazobactam  (ZOSYN) IVPB 3.375 g  Status:  Discontinued     3.375 g 100 mL/hr over 30 Minutes Intravenous 3 times per day 10/31/14 1731 10/31/14 1742   10/31/14 1745  piperacillin-tazobactam (ZOSYN) IVPB 3.375 g  Status:  Discontinued     3.375 g 12.5 mL/hr over 240 Minutes Intravenous 3 times per day 10/31/14 1742 10/31/14 1748   10/31/14 1345  vancomycin (VANCOCIN) IVPB 1000 mg/200 mL premix     1,000 mg 200 mL/hr over 60 Minutes Intravenous  Once 10/31/14 1338 10/31/14 1500   10/31/14 1345  piperacillin-tazobactam (ZOSYN) IVPB 3.375 g     3.375 g 12.5 mL/hr over 240 Minutes Intravenous  Once 10/31/14 1338 10/31/14 1423   10/31/14 1344  piperacillin-tazobactam (ZOSYN) 3.375 (3-0.375) G injection    Comments:  Odis Hollingshead: cabinet override      10/31/14 1344 10/31/14 1438     Assessment: 69 y.o. female presents to the emergency department with dizziness, lightheadedness, nausea and fever.  Ordered Vancomycin and Zosyn for sepsis.  Goal of Therapy:  Vancomycin trough level 15-20 mcg/ml  Plan:   Vancomycin 1g IV given in ED at 13:45.   Ordered Vancomycin 1g IV at 20:00 (~6 hours later) for stacked dosing.  Ordered Vancomycin 1g IV Q12H to begin 8/2 at 08:00.  Trough level ordered prior to 5th dose on 8/3 at 07:30.    Follow up  culture results  [place] Pharmacist 10/31/2014,7:43 PM

## 2014-10-31 NOTE — ED Provider Notes (Signed)
[facility] Emergency Department Provider Note  Time seen: 1:26 PM  I have reviewed the triage vital signs and the nursing notes.   HISTORY  Chief Complaint Tachycardia    HPI Anna [name] is a 69 y.o. female with a past medical history of anxiety, kidney stones, cardiac ablation in 1996 (likely due to Wolff-Parkinson-White syndrome) presents to the emergency department with dizziness, lightheadedness, nausea and fever. Upon arrival to the emergency department it was noted that her heart rate was approximately 210 bpm, and fever 102. Patient denies any cough, congestion, chest pain, abdominal pain, vomiting or diarrhea. She did feel nauseated and lightheaded today. She does note a slight smell to her urine but denies any dysuria or frequency. Denies any throat pain, or pain with swallowing. Patient describes her lightheadedness and dizziness as moderate at this time.     Past Medical History  Diagnosis Date  . History of melanoma excision RIGHT LEG  --  2012  . 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 Anxiety   . Kidney stone     Patient Active Problem List   Diagnosis Date Noted  . Chest pain 12/10/2012  . Blurred vision, bilateral 12/10/2012  . Hematuria 12/10/2012  . Anxiety state, unspecified 12/10/2012  . Melanoma     Past Surgical History  Procedure Laterality Date  . Cervical biopsy  w/ loop electrode excision  2005  . Svt-cardiac ablation    . Breast surgery  1999    BREAST MASS EXCISED  . Tubal ligation    . Colposcopy    . Melanoma excised    . Laparoscopic cholecystectomy  05-16-2005  . Cardiac electrophysiology study and ablation  [place]  (NO ISSUES SINCE)  . Hysteroscopy w/d&c  02/13/2012    Procedure: DILATATION AND CURETTAGE /HYSTEROSCOPY;  Surgeon: Bennetta Laos, MD;  Location: [facility];   Service: Gynecology;  Laterality: N/A;  deficit - 710  . Dilation and curettage of uterus    . Hysteroscopy      Current Outpatient Rx  Name  Route  Sig  Dispense  Refill  . ALPRAZolam (XANAX) 0.5 MG tablet      TAKE 1 TABLET AT BEDTIME AS NEEDED   30 tablet   5   . Calcium Carbonate-Vitamin D (CALCIUM-D PO)   Oral   Take 1 tablet by mouth daily.          . Cholecalciferol (VITAMIN D) 2000 UNITS CAPS   Oral   Take 1 capsule by mouth daily.          Marland Kitchen Fexofenadine HCl (ALLEGRA PO)   Oral   Take by mouth.         . fluticasone (FLONASE) 50 MCG/ACT nasal spray   Each Nare   Place into both nostrils daily.         . hydrocortisone (ANUSOL-HC) 25 MG suppository   Rectal   Place 1 suppository (25 mg total) rectally 2 (two) times daily.   12 suppository   3   . hydroxypropyl methylcellulose (ISOPTO TEARS) 2.5 % ophthalmic solution   Both Eyes   Place 1 drop into both eyes daily as needed. For dry eyes         . Multiple Vitamin (MULTIVITAMIN) capsule   Oral   Take 1 capsule by mouth daily.          Marland Kitchen  Omega-3 Fatty Acids (THE VERY FINEST FISH OIL) LIQD   Oral   Take by mouth daily. ONE TABLESPOON DAILY         . Probiotic Product (PROBIOTIC PO)   Oral   Take 2 capsules by mouth daily.            Allergies Azithromycin and Sulfa antibiotics  Family History  Problem Relation Age of Onset  . Cancer Mother     ESOPHAGEAL CANCER  . Diabetes Father   . Hyperlipidemia Father   . Heart disease Father   . Diabetes Brother   . Breast cancer Paternal Aunt     Age 28's  . Hyperlipidemia Paternal Grandmother   . Heart disease Paternal Grandmother   . Diabetes Paternal Grandfather   . Hyperlipidemia Paternal Grandfather   . Heart disease Paternal Grandfather     Social History History  Substance Use Topics  . Smoking status: Never Smoker   . Smokeless tobacco: Never Used  . Alcohol Use: 0.0 oz/week    0 Standard drinks or equivalent per week      Comment: rare    Review of Systems Constitutional: Positive for fever. Positive for lightheadedness. Positive for dizziness. Cardiovascular: Negative for chest pain. Respiratory: Negative for shortness of breath. Gastrointestinal: Negative for abdominal pain, vomiting and diarrhea. Positive for nausea. Genitourinary: Negative for dysuria. Positive for urine odor. Musculoskeletal: Negative for back pain Neurological: Negative for headaches, focal weakness or numbness. 10-point ROS otherwise negative.  ____________________________________________   PHYSICAL EXAM:  VITAL SIGNS: ED Triage Vitals  Enc Vitals Group     BP 10/31/14 1222 113/72 mmHg     Pulse Rate 10/31/14 1222 200     Resp 10/31/14 1222 16     Temp 10/31/14 1222 101.2 F (38.4 C)     Temp Source 10/31/14 1222 Oral     SpO2 10/31/14 1222 98 %     Weight 10/31/14 1222 193 lb (87.544 kg)     Height 10/31/14 1222 5\' 8"  (1.727 m)     Head Cir --      Peak Flow --      Pain Score 10/31/14 1224 6     Pain Loc --      Pain Edu? --      Excl. in [place]? --     Constitutional: Alert and oriented. Anxious appearing, mild distress. Eyes: Normal exam ENT   Head: Normocephalic and atraumatic.   Nose: No congestion/rhinnorhea. No pharyngeal erythema.   Mouth/Throat: Mucous membranes are moist. Cardiovascular: Regular rhythm rate around 200 bpm. Respiratory: Normal respiratory effort without tachypnea nor retractions. Breath sounds are clear and equal bilaterally. No wheezes/rales/rhonchi. Gastrointestinal: Soft and nontender. No distention. Musculoskeletal: Nontender with normal range of motion in all extremities.  Neurologic:  Normal speech and language. No gross focal neurologic deficits are appreciated. Speech is normal. Skin:  Skin is warm, dry and intact.  Psychiatric: Mood and affect are normal. Speech and behavior are normal.  ____________________________________________    EKG  EKG reviewed and  interpreted by myself shows sinus tachycardia 198 bpm. Narrow QRS, normal axis, largely normal intervals. Asian has ST elevation aVR with diffuse ST depression in all of the leads. EKG most consistent with SVT. With likely demand ischemia changes.  EKG #2  13:10:10 EKG reviewed and interpreted by myself shows sinus tachycardia 110 bpm, narrow QRS, normal axis, normal intervals, nonspecific ST changes present. No ST elevations noted. No diffuse ST depressions noted at this time.  ____________________________________________  RADIOLOGY  Negative chest xray  ____________________________________________   INITIAL IMPRESSION / ASSESSMENT AND PLAN / ED COURSE  Pertinent labs & imaging results that were available during my care of the patient were reviewed by me and considered in my medical decision making (see chart for details).  Patient afebrile with a heart rate around 200 bpm. Patient has spontaneously converted back to a normal sinus tachycardia of 120 bpm, however she reverted back to a SVT rhythm once again around 190 bpm. Given IV diltiazem, and the patient converted once again to a normal sinus rhythm around 110 bpm. ST changes appear to have resolved, ST changes present on the first EKG were likely due to demand ischemia. Patient doesn't a fever to 102 in the emergency department. Denies any symptoms besides a slight odor to her urine. No abdominal tenderness on exam, clear lung sounds, we'll proceed with a chest x-ray, labs, urinalysis to help further evaluate the source of the patient's fever. Denies headache, neck pain or stiffness, confusion. We will closely watched on a cardiac monitor. Anticipated admission for the patient.   Labs are resulted in largely normal limits (CBC pending) patient receiving IV antibiotics as she meets sepsis criteria. Heart rate has remained normal sinus rhythm since receiving diltiazem around 100 bpm. We will continue with IV fluids, IV antibiotics,  admitted to the hospital for further workup/evaluation. Chest x-ray is clear, urinalysis is normal.   CRITICAL CARE Performed by: Harvest Dark   Total critical care time: 45 minutes  Critical care time was exclusive of separately billable procedures and treating other patients.  Critical care was necessary to treat or prevent imminent or life-threatening deterioration.  Critical care was time spent personally by me on the following activities: development of treatment plan with patient and/or surrogate as well as nursing, discussions with consultants, evaluation of patient's response to treatment, examination of patient, obtaining history from patient or surrogate, ordering and performing treatments and interventions, ordering and review of laboratory studies, ordering and review of radiographic studies, pulse oximetry and re-evaluation of patient's condition.   ____________________________________________   FINAL CLINICAL IMPRESSION(S) / ED DIAGNOSES  Fever Supraventricular tachycardia Sepsis   Harvest Dark, MD 10/31/14 1446

## 2014-11-01 LAB — COMPREHENSIVE METABOLIC PANEL
ALT: 14 U/L (ref 14–54)
AST: 15 U/L (ref 15–41)
Albumin: 3.2 g/dL — ABNORMAL LOW (ref 3.5–5.0)
Alkaline Phosphatase: 73 U/L (ref 38–126)
Anion gap: 6 (ref 5–15)
BUN: 11 mg/dL (ref 6–20)
CO2: 23 mmol/L (ref 22–32)
Calcium: 8.2 mg/dL — ABNORMAL LOW (ref 8.9–10.3)
Chloride: 113 mmol/L — ABNORMAL HIGH (ref 101–111)
Creatinine, Ser: 0.81 mg/dL (ref 0.44–1.00)
GFR calc Af Amer: >60 mL/min (ref >60)
GFR calc non Af Amer: >60 mL/min (ref >60)
Glucose, Bld: 102 mg/dL — ABNORMAL HIGH (ref 65–99)
Potassium: 3.6 mmol/L (ref 3.5–5.1)
Sodium: 142 mmol/L (ref 135–145)
Total Bilirubin: 0.7 mg/dL (ref 0.3–1.2)
Total Protein: 6.4 g/dL — ABNORMAL LOW (ref 6.5–8.1)

## 2014-11-01 LAB — CBC
HCT: 38 % (ref 35.0–47.0)
Hemoglobin: 12.6 g/dL (ref 12.0–16.0)
MCH: 31.7 pg (ref 26.0–34.0)
MCHC: 33.3 g/dL (ref 32.0–36.0)
MCV: 95 fL (ref 80.0–100.0)
Platelets: 232 10*3/uL (ref 150–440)
RBC: 3.99 MIL/uL (ref 3.80–5.20)
RDW: 13.2 % (ref 11.5–14.5)
WBC: 8.4 10*3/uL (ref 3.6–11.0)

## 2014-11-01 LAB — TSH: TSH: 1.518 u[IU]/mL (ref 0.350–4.500)

## 2014-11-01 LAB — TROPONIN I
Troponin I: 0.03 ng/mL (ref <0.031)
Troponin I: <0.03 ng/mL (ref <0.031)

## 2014-11-01 NOTE — Progress Notes (Signed)
[facility] [place] NAME: Anna [name]    MR#:  [id number]  DATE OF BIRTH:  07/29/1945  SUBJECTIVE:  CHIEF COMPLAINT:   Chief Complaint  Patient presents with  . Tachycardia   Patient presented to the hospital with fever and also noted to be tachycardic. Afebrile overnight and now in a normal sinus rhythm.  REVIEW OF SYSTEMS:    Review of Systems  Constitutional: Negative for fever and chills.  HENT: Negative for congestion and tinnitus.   Eyes: Negative for blurred vision and double vision.  Respiratory: Negative for cough, shortness of breath and wheezing.   Cardiovascular: Negative for chest pain, orthopnea and PND.  Gastrointestinal: Negative for nausea, vomiting, abdominal pain and diarrhea.  Genitourinary: Negative for dysuria and hematuria.  Neurological: Positive for weakness (generalized). Negative for dizziness, sensory change and focal weakness.  All other systems reviewed and are negative.   Nutrition: Heart healthy Tolerating Diet: Yes Tolerating PT: Ambulatory  DRUG ALLERGIES:   Allergies  Allergen Reactions  . Sulfa Antibiotics Hives and Nausea Only  . Azithromycin Diarrhea, Palpitations and Other (See Comments)    Reaction:  Cramping     VITALS:  Blood pressure 100/57, pulse 72, temperature 98.1 F (36.7 C), temperature source Oral, resp. rate 19, height 5' 8" (1.727 m), weight 87.544 kg (193 lb), SpO2 98 %.  PHYSICAL EXAMINATION:   Physical Exam  GENERAL:  69 y.o.-year-old patient lying in the bed with no acute distress.  EYES: Pupils equal, round, reactive to light and accommodation. No scleral icterus. Extraocular muscles intact.  HEENT: Head atraumatic, normocephalic. Oropharynx and nasopharynx clear.  NECK:  Supple, no jugular venous distention. No thyroid enlargement, no tenderness.  LUNGS: Normal breath sounds bilaterally, no wheezing, rales, rhonchi. No use of accessory muscles of  respiration.  CARDIOVASCULAR: S1, S2 RRR. No murmurs, rubs, or gallops.  ABDOMEN: Soft, nontender, nondistended. Bowel sounds present. No organomegaly or mass.  EXTREMITIES: No cyanosis, clubbing or edema b/l.    NEUROLOGIC: Cranial nerves II through XII are intact. No focal Motor or sensory deficits b/l.   PSYCHIATRIC: The patient is alert and oriented x 3. Good affect SKIN: No obvious rash, lesion, or ulcer.    LABORATORY PANEL:   CBC  Recent Labs Lab 11/01/14 0524  WBC 8.4  HGB 12.6  HCT 38.0  PLT 232   ------------------------------------------------------------------------------------------------------------------  Chemistries   Recent Labs Lab 11/01/14 0524  NA 142  K 3.6  CL 113*  CO2 23  GLUCOSE 102*  BUN 11  CREATININE 0.81  CALCIUM 8.2*  AST 15  ALT 14  ALKPHOS 73  BILITOT 0.7   ------------------------------------------------------------------------------------------------------------------  Cardiac Enzymes  Recent Labs Lab 11/01/14 0524  TROPONINI <0.03   ------------------------------------------------------------------------------------------------------------------  RADIOLOGY:  Dg Chest Port 1 View  10/31/2014   CLINICAL DATA:  Chest pain  EXAM: PORTABLE CHEST - 1 VIEW  COMPARISON:  None.  FINDINGS: Generous heart size but no cardiomegaly for technique. Negative aortic and hilar contours. There is no edema, consolidation, effusion, or pneumothorax. No osseous findings to explain chest pain  IMPRESSION: Negative portable chest.   Electronically Signed   By: Monte Fantasia M.D.   On: 10/31/2014 13:14     ASSESSMENT AND PLAN:   69 year old female with past medical history of WPW status post ablation, anxiety, history of melanoma, nephrolithiasis, who presented to the hospital due to fever and also noted to be tachycardic.  #1 sepsis-patient met criteria she presented  with tachycardia with heart rates over 200 and also noted to be febrile with  a temperature 101 and also had a leukocytosis. -The source of the sepsis is still unclear. Patient had a normal urinalysis, normal chest x-ray. Patient has no acute GI/respiratory symptoms presently. -We'll DC vancomycin, continue Zosyn. Follow blood cultures.  #2 SVT/tachycardia-patient has a history of WPW status post ablation. -Patient presented with heart rates in 200s. This was likely secondary to the fever. -Patient has converted to normal sinus rhythm. -Cardiac markers have trended down. Will await further cardiology input.  #3 anxiety-continue Xanax.  #4 GERD-continue Pepcid.    All the records are reviewed and case discussed with Care Management/Social Workerr. Management plans discussed with the patient, family and they are in agreement.  CODE STATUS: Full  DVT Prophylaxis: Lovenox  TOTAL TIME TAKING CARE OF THIS PATIENT: 30 minutes.   POSSIBLE D/C IN 1-2 DAYS, DEPENDING ON CLINICAL CONDITION.   Henreitta Leber M.D on 11/01/2014 at 10:49 AM  Between 7am to 6pm - Pager - [phone]  After 6pm go to [email or web address] - password [email or web address] [facility]  Office  [phone]  CC: Primary care physician; Kirk Ruths., MD

## 2014-11-01 NOTE — Progress Notes (Signed)
Patient alert and oriented x4, no complaints at this time. vss at this time. Patient NSR on telemetry. Will continue to assess. Lavenia Stumpo R [place]  

## 2014-11-02 LAB — CBC
HCT: 37.2 % (ref 35.0–47.0)
Hemoglobin: 12.3 g/dL (ref 12.0–16.0)
MCH: 31.5 pg (ref 26.0–34.0)
MCHC: 33.1 g/dL (ref 32.0–36.0)
MCV: 95 fL (ref 80.0–100.0)
Platelets: 238 10*3/uL (ref 150–440)
RBC: 3.91 MIL/uL (ref 3.80–5.20)
RDW: 13.6 % (ref 11.5–14.5)
WBC: 7.1 10*3/uL (ref 3.6–11.0)

## 2014-11-02 LAB — URINE CULTURE

## 2014-11-02 MED ORDER — DOXYCYCLINE HYCLATE 100 MG PO CAPS: 100.0000 mg | ORAL_CAPSULE | Freq: Two times a day (BID) | ORAL | Status: DC

## 2014-11-02 NOTE — Progress Notes (Signed)
Patient given discharge teaching and paperwork regarding medications, diet, follow-up appointments and activity. Patient understanding verbalized. No complaints at this time. IV and telemetry discontinued. Skin assessment as previously charted and vitals are stable. Patient being discharged to home. No further needs by Care Management.  Toniann Ket

## 2014-11-02 NOTE — Discharge Instructions (Signed)

## 2014-11-02 NOTE — Care Management Important Message (Signed)
Important Message  Patient Details  Name: Anna [name] MRN: [id number] Date of Birth: [birth year]   Medicare Important Message Given:  Yes-second notification given    Juliann Pulse A Allmond 11/02/2014, 10:35 AM

## 2014-11-02 NOTE — Progress Notes (Signed)
Patient checked for tics, no signs. Per Dr. Verdell Carmine, patient is ready to discharge. Prescription is on chart. Add Dr. Blane Ohara office number to discharge paper work so she can call him if concerned.  Anna [name]

## 2014-11-02 NOTE — Discharge Summary (Signed)
[facility] [place] NAME: Anna [name]    MR#:  [id number]  DATE OF BIRTH:  Aug 01, 1945  DATE OF ADMISSION:  10/31/2014 ADMITTING PHYSICIAN: Nicholes Mango, MD  DATE OF DISCHARGE: 11/02/2014  1:02 PM  PRIMARY CARE PHYSICIAN: Kirk Ruths., MD    ADMISSION DIAGNOSIS:  SVT (supraventricular tachycardia) [I47.1] Fever chills [R50.9] Chest pain [R07.9] Sepsis, due to unspecified organism [A41.9]  DISCHARGE DIAGNOSIS:  Active Problems:   Sepsis   SECONDARY DIAGNOSIS:   Past Medical History  Diagnosis Date  . History of melanoma excision RIGHT LEG  --  2012  . 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 Anxiety   . Kidney stone   . Cancer     HOSPITAL COURSE:   69 year old female with past medical history of WPW status post ablation, anxiety, history of melanoma, nephrolithiasis, who presented to the hospital due to fever and also noted to be tachycardic.  #1 sepsis-patient met criteria when she presented with tachycardia with heart rates over 200 and also noted to be febrile with a temperature 101 and also had a leukocytosis. -The source of the sepsis is still unclear. Patient had a normal urinalysis, normal chest x-ray. Patient has no acute GI/respiratory symptoms presently. -Initially patient was too with broad-spectrum IV antibiotics with vancomycin/Zosyn. Her blood cultures, urine cultures remained negative. She has no rash. She was empirically discharged on oral doxycycline to cover for possible underlying tickborne illness. - Lyme disease and RMSF antibodies were sent prior to discharge.  Pt. To follow up with her PCP next week.   #2 SVT/tachycardia-patient has a history of WPW status post ablation. -Patient presented with heart rates in 200s. This was likely secondary to the fever. -Patient's has remained in a normal sinus rhythm since her fever has resolved. She  has no acute chest pain. Her cardiac markers have trended down.  -she will follow-up with her cardiologist Dr. Ubaldo Glassing as outpatient   #3 anxiety-continue Xanax.  #4 GERD-continue Pepcid.  DISCHARGE CONDITIONS:    stable CONSULTS OBTAINED:  Treatment Team:  Nicholes Mango, MD Yolonda Kida, MD  DRUG ALLERGIES:   Allergies  Allergen Reactions  . Sulfa Antibiotics Hives and Nausea Only  . Azithromycin Diarrhea, Palpitations and Other (See Comments)    Reaction:  Cramping     DISCHARGE MEDICATIONS:   Discharge Medication List as of 11/02/2014 12:12 PM    START taking these medications   Details  doxycycline (VIBRAMYCIN) 100 MG capsule Take 1 capsule (100 mg total) by mouth 2 (two) times daily., Starting 11/02/2014, Until Discontinued, Print      CONTINUE these medications which have NOT CHANGED   Details  ALPRAZolam (XANAX) 0.5 MG tablet TAKE 1 TABLET AT BEDTIME AS NEEDED, Print    fexofenadine (ALLEGRA) 180 MG tablet Take 180 mg by mouth daily as needed for allergies., Until Discontinued, Historical Med    fluticasone (FLONASE) 50 MCG/ACT nasal spray Place 1 spray into both nostrils daily as needed for rhinitis. , Until Discontinued, Historical Med    hydrocortisone (ANUSOL-HC) 25 MG suppository Place 1 suppository (25 mg total) rectally 2 (two) times daily., Starting 04/19/2014, Until Discontinued, Normal    Polyethyl Glycol-Propyl Glycol (SYSTANE OP) Apply 1 drop to eye as needed (for dry eyes)., Until Discontinued, Historical Med         DISCHARGE INSTRUCTIONS:   DIET:  Cardiac diet  DISCHARGE  CONDITION:  Stable  ACTIVITY:  Activity as tolerated  OXYGEN:  Home Oxygen: No.   Oxygen Delivery: room air  DISCHARGE LOCATION:  home   If you experience worsening of your admission symptoms, develop shortness of breath, life threatening emergency, suicidal or homicidal thoughts you must seek medical attention immediately by calling 911 or calling your MD  immediately  if symptoms less severe.  You Must read complete instructions/literature along with all the possible adverse reactions/side effects for all the Medicines you take and that have been prescribed to you. Take any new Medicines after you have completely understood and accpet all the possible adverse reactions/side effects.   Please note  You were cared for by a hospitalist during your hospital stay. If you have any questions about your discharge medications or the care you received while you were in the hospital after you are discharged, you can call the unit and asked to speak with the hospitalist on call if the hospitalist that took care of you is not available. Once you are discharged, your primary care physician will handle any further medical issues. Please note that NO REFILLS for any discharge medications will be authorized once you are discharged, as it is imperative that you return to your primary care physician (or establish a relationship with a primary care physician if you do not have one) for your aftercare needs so that they can reassess your need for medications and monitor your lab values.     Today    afebrile, hemodynamically stable. No complaints presently and feels better.  VITAL SIGNS:  Blood pressure 179/77, pulse 85, temperature 97.8 F (36.6 C), temperature source Oral, resp. rate 17, height 5' 8"  (1.727 m), weight 87.544 kg (193 lb), SpO2 99 %.  I/O:   Intake/Output Summary (Last 24 hours) at 11/02/14 1600 Last data filed at 11/01/14 1851  Gross per 24 hour  Intake 788.33 ml  Output      0 ml  Net 788.33 ml    PHYSICAL EXAMINATION:  GENERAL:  69 y.o.-year-old patient lying in the bed with no acute distress.  EYES: Pupils equal, round, reactive to light and accommodation. No scleral icterus. Extraocular muscles intact.  HEENT: Head atraumatic, normocephalic. Oropharynx and nasopharynx clear.  NECK:  Supple, no jugular venous distention. No thyroid  enlargement, no tenderness.  LUNGS: Normal breath sounds bilaterally, no wheezing, rales,rhonchi. No use of accessory muscles of respiration.  CARDIOVASCULAR: S1, S2 normal. No murmurs, rubs, or gallops.  ABDOMEN: Soft, non-tender, non-distended. Bowel sounds present. No organomegaly or mass.  EXTREMITIES: No pedal edema, cyanosis, or clubbing.  NEUROLOGIC: Cranial nerves II through XII are intact. No focal motor or sensory defecits b/l.  PSYCHIATRIC: The patient is alert and oriented x 3. Good affect.  SKIN: No obvious rash, lesion, or ulcer.   DATA REVIEW:   CBC  Recent Labs Lab 11/02/14 0440  WBC 7.1  HGB 12.3  HCT 37.2  PLT 238    Chemistries   Recent Labs Lab 11/01/14 0524  NA 142  K 3.6  CL 113*  CO2 23  GLUCOSE 102*  BUN 11  CREATININE 0.81  CALCIUM 8.2*  AST 15  ALT 14  ALKPHOS 73  BILITOT 0.7    Cardiac Enzymes  Recent Labs Lab 11/01/14 0524  TROPONINI <0.03    Microbiology Results  Results for orders placed or performed during the hospital encounter of 10/31/14  Urine culture     Status: None   Collection Time: 10/31/14  1:12 PM  Result Value Ref Range Status   Specimen Description URINE, CLEAN CATCH  Final   Special Requests NONE  Final   Culture INSIGNIFICANT GROWTH  Final   Report Status 11/02/2014 FINAL  Final  Blood culture (routine x 2)     Status: None (Preliminary result)   Collection Time: 10/31/14  1:52 PM  Result Value Ref Range Status   Specimen Description BLOOD RIGHT ARM  Final   Special Requests BOTTLES DRAWN AEROBIC AND ANAEROBIC 5CC  Final   Culture NO GROWTH 2 DAYS  Final   Report Status PENDING  Incomplete  Blood culture (routine x 2)     Status: None (Preliminary result)   Collection Time: 10/31/14  1:52 PM  Result Value Ref Range Status   Specimen Description BLOOD RIGHT ARM  Final   Special Requests   Final    BOTTLES DRAWN AEROBIC AND ANAEROBIC [place] AEB,AER 5CC   Culture NO GROWTH 2 DAYS  Final   Report Status  PENDING  Incomplete    RADIOLOGY:  No results found.    Management plans discussed with the patient, family and they are in agreement.  CODE STATUS:     Code Status Orders        Start     Ordered   10/31/14 1732  Full code   Continuous     10/31/14 1731      TOTAL TIME TAKING CARE OF THIS PATIENT - 40 minutes.    Henreitta Leber M.D on 11/02/2014 at 4:00 PM  Between 7am to 6pm - Pager - [phone]  After 6pm go to [email or web address] - password [email or web address] [facility]  Office  [phone]  CC: Primary care physician; Kirk Ruths., MD

## 2014-11-03 LAB — ROCKY MTN SPOTTED FVR ABS PNL(IGG+IGM)
RMSF IgG: NEGATIVE
RMSF IgM: 0.32 index (ref 0.00–0.89)

## 2014-11-03 LAB — B. BURGDORFI ANTIBODIES: B burgdorferi Ab IgG+IgM: <0.91 {ISR} (ref 0.00–0.90)

## 2014-11-05 LAB — CULTURE, BLOOD (ROUTINE X 2)
Culture: NO GROWTH
Culture: NO GROWTH

## 2015-04-21 ENCOUNTER — Encounter: Payer: Medicare Other | Admitting: Gynecology

## 2015-06-07 ENCOUNTER — Encounter: Payer: Medicare Other | Admitting: Gynecology

## 2015-06-22 ENCOUNTER — Ambulatory Visit (INDEPENDENT_AMBULATORY_CARE_PROVIDER_SITE_OTHER): Payer: Medicare Other | Admitting: Gynecology

## 2015-06-22 ENCOUNTER — Encounter: Payer: Self-pay | Admitting: Gynecology

## 2015-06-22 VITALS — BP 120/72 | Ht 68.0 in | Wt 197.0 lb

## 2015-06-22 DIAGNOSIS — K648 Other hemorrhoids: Secondary | ICD-10-CM | POA: Diagnosis not present

## 2015-06-22 DIAGNOSIS — Z01419 Encounter for gynecological examination (general) (routine) without abnormal findings: Secondary | ICD-10-CM

## 2015-06-22 DIAGNOSIS — Z124 Encounter for screening for malignant neoplasm of cervix: Secondary | ICD-10-CM | POA: Diagnosis not present

## 2015-06-22 DIAGNOSIS — N952 Postmenopausal atrophic vaginitis: Secondary | ICD-10-CM | POA: Diagnosis not present

## 2015-06-22 MED ORDER — HYDROCORTISONE ACETATE 25 MG RE SUPP: 25.0000 mg | Freq: Two times a day (BID) | RECTAL | Status: DC | PRN

## 2015-06-22 NOTE — Addendum Note (Signed)
Addended by: Nelva Nay on: 06/22/2015 12:32 PM   Modules accepted: Orders

## 2015-06-22 NOTE — Patient Instructions (Signed)

## 2015-06-22 NOTE — Progress Notes (Signed)
    Anna [name] 01-Oct-1945 [id number]        70 y.o.  G2P2002  for breast and pelvic exam.  Past medical history,surgical history, problem list, medications, allergies, family history and social history were all reviewed and documented as reviewed in the EPIC chart.  ROS:  Performed with pertinent positives and negatives included in the history, assessment and plan.   Additional significant findings :   none   Exam: [name] assistant Filed Vitals:   06/22/15 1156  BP: 120/72  Height: 5\' 8"  (1.727 m)  Weight: 197 lb (89.359 kg)   General appearance:  Normal affect, orientation and appearance. Skin: Grossly normal HEENT: Without gross lesions.  No cervical or supraclavicular adenopathy. Thyroid normal.  Lungs:  Clear without wheezing, rales or rhonchi Cardiac: RR, without RMG Abdominal:  Soft, nontender, without masses, guarding, rebound, organomegaly or hernia Breasts:  Examined lying and sitting without masses, retractions, discharge or axillary adenopathy. Pelvic:  Ext/BUS/vagina with atrophic changes  Cervix with atrophic changes  Uterus anteverted, normal size, shape and contour, midline and mobile nontender   Adnexa without masses or tenderness    Anus and perineum normal excepting old external hemorrhoids  Rectovaginal normal sphincter tone without palpated masses or tenderness.    Assessment/Plan:  70 y.o. [id number] female for breast and pelvic exam.   1. Postmenopausal/atrophic genital changes. Doing well without significant hot flashes, night sweats, vaginal dryness or any vaginal bleeding. Continue to monitor and report any vaginal bleeding. 2. Pap smear 2014. Pap smear done today. No history of abnormal Pap smears previously.  Options to stop screening altogether or less frequent screening intervals reviewed per current screening guidelines. Will readdress on annual basis. 3. Mammography do now and I reminded the patient to call and schedule and she agrees to do so.  SBE monthly reviewed. 4. DEXA 2016 normal. Plan repeat at 5 year interval. Increased calcium vitamin D. 5. Insomnia. Patient uses Xanax as needed. She already received a refill through her primary physician's office. 6. Hemorrhoids. Uses Anusol 25 mg suppository as needed. #12 with 3 refills provided. 7. Colonoscopy 2013. Repeat at their recommended interval. 8. Health maintenance. No routine lab work done as patient reports this done elsewhere. Follow up 1 year, sooner as needed.   Anastasio Auerbach MD, 12:18 PM 06/22/2015

## 2015-06-23 LAB — PAP IG W/ RFLX HPV ASCU

## 2015-06-28 ENCOUNTER — Telehealth: Payer: Self-pay | Admitting: *Deleted

## 2015-06-28 MED ORDER — HYDROCORTISONE ACETATE 25 MG RE SUPP: 25.0000 mg | Freq: Two times a day (BID) | RECTAL | Status: DC | PRN

## 2015-06-28 NOTE — Telephone Encounter (Signed)
Pt called requesting her Rx for Anusol-HC be sent to another pharmacy [facility] [address] in [place]. Rx sent. Pt asked for 24 size box which will be cheaper with larger supply.

## 2015-08-23 ENCOUNTER — Emergency Department
Admission: EM | Admit: 2015-08-23 | Discharge: 2015-08-23 | Disposition: A | Discharge status: Home / Self Care | Payer: Medicare Other | Attending: Emergency Medicine | Admitting: Emergency Medicine

## 2015-08-23 ENCOUNTER — Encounter: Payer: Self-pay | Admitting: Emergency Medicine

## 2015-08-23 DIAGNOSIS — Y9301 Activity, walking, marching and hiking: Secondary | ICD-10-CM | POA: Insufficient documentation

## 2015-08-23 DIAGNOSIS — Y929 Unspecified place or not applicable: Secondary | ICD-10-CM | POA: Insufficient documentation

## 2015-08-23 DIAGNOSIS — S0990XA Unspecified injury of head, initial encounter: Secondary | ICD-10-CM

## 2015-08-23 DIAGNOSIS — Z23 Encounter for immunization: Secondary | ICD-10-CM | POA: Diagnosis not present

## 2015-08-23 DIAGNOSIS — Z85828 Personal history of other malignant neoplasm of skin: Secondary | ICD-10-CM | POA: Insufficient documentation

## 2015-08-23 DIAGNOSIS — W228XXA Striking against or struck by other objects, initial encounter: Secondary | ICD-10-CM | POA: Insufficient documentation

## 2015-08-23 DIAGNOSIS — Z79899 Other long term (current) drug therapy: Secondary | ICD-10-CM | POA: Insufficient documentation

## 2015-08-23 DIAGNOSIS — Y999 Unspecified external cause status: Secondary | ICD-10-CM | POA: Diagnosis not present

## 2015-08-23 DIAGNOSIS — Z7951 Long term (current) use of inhaled steroids: Secondary | ICD-10-CM | POA: Diagnosis not present

## 2015-08-23 DIAGNOSIS — S0181XA Laceration without foreign body of other part of head, initial encounter: Secondary | ICD-10-CM | POA: Insufficient documentation

## 2015-08-23 DIAGNOSIS — S0083XA Contusion of other part of head, initial encounter: Secondary | ICD-10-CM

## 2015-08-23 MED ORDER — TETANUS-DIPHTH-ACELL PERTUSSIS 5-2.5-18.5 LF-MCG/0.5 IM SUSP
0.5000 mL | Freq: Once | INTRAMUSCULAR | Status: AC
  Administered 2015-08-23: 0.5 mL via INTRAMUSCULAR
  Filled 2015-08-23: qty 0.5

## 2015-08-23 NOTE — Discharge Instructions (Signed)
Cryotherapy Cryotherapy is when you put ice on your injury. Ice helps lessen pain and puffiness (swelling) after an injury. Ice works the best when you start using it in the first 24 to 48 hours after an injury. HOME CARE  Put a dry or damp towel between the ice pack and your skin.  You may press gently on the ice pack.  Leave the ice on for no more than 10 to 20 minutes at a time.  Check your skin after 5 minutes to make sure your skin is okay.  Rest at least 20 minutes between ice pack uses.  Stop using ice when your skin loses feeling (numbness).  Do not use ice on someone who cannot tell you when it hurts. This includes small children and people with memory problems (dementia). GET HELP RIGHT AWAY IF:  You have white spots on your skin.  Your skin turns blue or pale.  Your skin feels waxy or hard.  Your puffiness gets worse. MAKE SURE YOU:   Understand these instructions.  Will watch your condition.  Will get help right away if you are not doing well or get worse.   This information is not intended to replace advice given to you by your health care provider. Make sure you discuss any questions you have with your health care provider.   Document Released: 09/04/2007 Document Revised: 06/10/2011 Document Reviewed: 11/08/2010 Elsevier Interactive Patient Education 2016 Elsevier Inc[employer]  Facial or Scalp Contusion A facial or scalp contusion is a deep bruise on the face or head. Injuries to the face and head generally cause a lot of swelling, especially around the eyes. Contusions are the result of an injury that caused bleeding under the skin. The contusion may turn blue, purple, or yellow. Minor injuries will give you a painless contusion, but more severe contusions may stay painful and swollen for a few weeks.  CAUSES  A facial or scalp contusion is caused by a blunt injury or trauma to the face or head area.  SIGNS AND SYMPTOMS   Swelling of the injured area.    Discoloration of the injured area.   Tenderness, soreness, or pain in the injured area.  DIAGNOSIS  The diagnosis can be made by taking a medical history and doing a physical exam. An X-ray exam, CT scan, or MRI may be needed to determine if there are any associated injuries, such as broken bones (fractures). TREATMENT  Often, the best treatment for a facial or scalp contusion is applying cold compresses to the injured area. Over-the-counter medicines may also be recommended for pain control.  HOME CARE INSTRUCTIONS   Only take over-the-counter or prescription medicines as directed by your health care provider.   Apply ice to the injured area.   Put ice in a plastic bag.   Place a towel between your skin and the bag.   Leave the ice on for 20 minutes, 2-3 times a day.  SEEK MEDICAL CARE IF:  You have bite problems.   You have pain with chewing.   You are concerned about facial defects. SEEK IMMEDIATE MEDICAL CARE IF:  You have severe pain or a headache that is not relieved by medicine.   You have unusual sleepiness, confusion, or personality changes.   You throw up (vomit).   You have a persistent nosebleed.   You have double vision or blurred vision.   You have fluid drainage from your nose or ear.   You have difficulty walking or using your  arms or legs.  MAKE SURE YOU:   Understand these instructions.  Will watch your condition.  Will get help right away if you are not doing well or get worse.   This information is not intended to replace advice given to you by your health care provider. Make sure you discuss any questions you have with your health care provider.   Document Released: 04/25/2004 Document Revised: 04/08/2014 Document Reviewed: 10/29/2012 Elsevier Interactive Patient Education 2016 Elsevier Inc.  Facial Laceration  A facial laceration is a cut on the face. These injuries can be painful and cause bleeding. Lacerations usually  heal quickly, but they need special care to reduce scarring. DIAGNOSIS  Your health care provider will take a medical history, ask for details about how the injury occurred, and examine the wound to determine how deep the cut is. TREATMENT  Some facial lacerations may not require closure. Others may not be able to be closed because of an increased risk of infection. The risk of infection and the chance for successful closure will depend on various factors, including the amount of time since the injury occurred. The wound may be cleaned to help prevent infection. If closure is appropriate, pain medicines may be given if needed. Your health care provider will use stitches (sutures), wound glue (adhesive), or skin adhesive strips to repair the laceration. These tools bring the skin edges together to allow for faster healing and a better cosmetic outcome. If needed, you may also be given a tetanus shot. HOME CARE INSTRUCTIONS  Only take over-the-counter or prescription medicines as directed by your health care provider.  Follow your health care provider's instructions for wound care. These instructions will vary depending on the technique used for closing the wound. For Sutures:  Keep the wound clean and dry.   If you were given a bandage (dressing), you should change it at least once a day. Also change the dressing if it becomes wet or dirty, or as directed by your health care provider.   Wash the wound with soap and water 2 times a day. Rinse the wound off with water to remove all soap. Pat the wound dry with a clean towel.   After cleaning, apply a thin layer of the antibiotic ointment recommended by your health care provider. This will help prevent infection and keep the dressing from sticking.   You may shower as usual after the first 24 hours. Do not soak the wound in water until the sutures are removed.   Get your sutures removed as directed by your health care provider. With facial  lacerations, sutures should usually be taken out after 4-5 days to avoid stitch marks.   Wait a few days after your sutures are removed before applying any makeup. For Skin Adhesive Strips:  Keep the wound clean and dry.   Do not get the skin adhesive strips wet. You may bathe carefully, using caution to keep the wound dry.   If the wound gets wet, pat it dry with a clean towel.   Skin adhesive strips will fall off on their own. You may trim the strips as the wound heals. Do not remove skin adhesive strips that are still stuck to the wound. They will fall off in time.  For Wound Adhesive:  You may briefly wet your wound in the shower or bath. Do not soak or scrub the wound. Do not swim. Avoid periods of heavy sweating until the skin adhesive has fallen off on its own. After  showering or bathing, gently pat the wound dry with a clean towel.   Do not apply liquid medicine, cream medicine, ointment medicine, or makeup to your wound while the skin adhesive is in place. This may loosen the film before your wound is healed.   If a dressing is placed over the wound, be careful not to apply tape directly over the skin adhesive. This may cause the adhesive to be pulled off before the wound is healed.   Avoid prolonged exposure to sunlight or tanning lamps while the skin adhesive is in place.  The skin adhesive will usually remain in place for 5-10 days, then naturally fall off the skin. Do not pick at the adhesive film.  After Healing: Once the wound has healed, cover the wound with sunscreen during the day for 1 full year. This can help minimize scarring. Exposure to ultraviolet light in the first year will darken the scar. It can take 1-2 years for the scar to lose its redness and to heal completely.  SEEK MEDICAL CARE IF:  You have a fever. SEEK IMMEDIATE MEDICAL CARE IF:  You have redness, pain, or swelling around the wound.   You see ayellowish-white fluid (pus) coming from the  wound.    This information is not intended to replace advice given to you by your health care provider. Make sure you discuss any questions you have with your health care provider.   Document Released: 04/25/2004 Document Revised: 04/08/2014 Document Reviewed: 10/29/2012 Elsevier Interactive Patient Education 2016 [place] Injury, Adult You have a head injury. Headaches and throwing up (vomiting) are common after a head injury. It should be easy to wake up from sleeping. Sometimes you must stay in the hospital. Most problems happen within the first 24 hours. Side effects may occur up to 7-10 days after the injury.  WHAT ARE THE TYPES OF HEAD INJURIES? Head injuries can be as minor as a bump. Some head injuries can be more severe. More severe head injuries include:  A jarring injury to the brain (concussion).  A bruise of the brain (contusion). This mean there is bleeding in the brain that can cause swelling.  A cracked skull (skull fracture).  Bleeding in the brain that collects, clots, and forms a bump (hematoma). WHEN SHOULD I GET HELP RIGHT AWAY?   You are confused or sleepy.  You cannot be woken up.  You feel sick to your stomach (nauseous) or keep throwing up (vomiting).  Your dizziness or unsteadiness is getting worse.  You have very bad, lasting headaches that are not helped by medicine. Take medicines only as told by your doctor.  You cannot use your arms or legs like normal.  You cannot walk.  You notice changes in the black spots in the center of the colored part of your eye (pupil).  You have clear or bloody fluid coming from your nose or ears.  You have trouble seeing. During the next 24 hours after the injury, you must stay with someone who can watch you. This person should get help right away (call 911 in the [place] if you start to shake and are not able to control it (have seizures), you pass out, or you are unable to wake up. HOW CAN I PREVENT A  HEAD INJURY IN THE FUTURE?  Wear seat belts.  Wear a helmet while bike riding and playing sports like football.  Stay away from dangerous activities around the house. WHEN CAN I RETURN TO NORMAL  ACTIVITIES AND ATHLETICS? See your doctor before doing these activities. You should not do normal activities or play contact sports until 1 week after the following symptoms have stopped:  Headache that does not go away.  Dizziness.  Poor attention.  Confusion.  Memory problems.  Sickness to your stomach or throwing up.  Tiredness.  Fussiness.  Bothered by bright lights or loud noises.  Anxiousness or depression.  Restless sleep. MAKE SURE YOU:   Understand these instructions.  Will watch your condition.  Will get help right away if you are not doing well or get worse.   This information is not intended to replace advice given to you by your health care provider. Make sure you discuss any questions you have with your health care provider.   Document Released: 02/29/2008 Document Revised: 04/08/2014 Document Reviewed: 11/23/2012 Elsevier Interactive Patient Education 2016 [employer]  Stitches, RosepineStaples, or Adhesive Wound Closure Health care providers use stitches (sutures), staples, and certain glue (skin adhesives) to hold skin together while it heals (wound closure). You may need this treatment after you have surgery or if you cut your skin accidentally. These methods help your skin to heal more quickly and make it less likely that you will have a scar. A wound may take several months to heal completely. The type of wound you have determines when your wound gets closed. In most cases, the wound is closed as soon as possible (primary skin closure). Sometimes, closure is delayed so the wound can be cleaned and allowed to heal naturally. This reduces the chance of infection. Delayed closure may be needed if your wound:  Is caused by a bite.  Happened more than 6 hours  ago.  Involves loss of skin or the tissues under the skin.  Has dirt or debris in it that cannot be removed.  Is infected. WHAT ARE THE DIFFERENT KINDS OF WOUND CLOSURES? There are many options for wound closure. The one that your health care provider uses depends on how deep and how large your wound is. Adhesive Glue To use this type of glue to close a wound, your health care provider holds the edges of the wound together and paints the glue on the surface of your skin. You may need more than one layer of glue. Then the wound may be covered with a light bandage (dressing). This type of skin closure may be used for small wounds that are not deep (superficial). Using glue for wound closure is less painful than other methods. It does not require a medicine that numbs the area (local anesthetic). This method also leaves nothing to be removed. Adhesive glue is often used for children and on facial wounds. Adhesive glue cannot be used for wounds that are deep, uneven, or bleeding. It is not used inside of a wound.  Adhesive Strips These strips are made of sticky (adhesive), porous paper. They are applied across your skin edges like a regular adhesive bandage. You leave them on until they fall off. Adhesive strips may be used to close very superficial wounds. They may also be used along with sutures to improve the closure of your skin edges.  Sutures Sutures are the oldest method of wound closure. Sutures can be made from natural substances, such as silk, or from synthetic materials, such as nylon and steel. They can be made from a material that your body can break down as your wound heals (absorbable), or they can be made from a material that needs to be removed  from your skin (nonabsorbable). They come in many different strengths and sizes. Your health care provider attaches the sutures to a steel needle on one end. Sutures can be passed through your skin, or through the tissues beneath your skin. Then  they are tied and cut. Your skin edges may be closed in one continuous stitch or in separate stitches. Sutures are strong and can be used for all kinds of wounds. Absorbable sutures may be used to close tissues under the skin. The disadvantage of sutures is that they may cause skin reactions that lead to infection. Nonabsorbable sutures need to be removed. Staples When surgical staples are used to close a wound, the edges of your skin on both sides of the wound are brought close together. A staple is placed across the wound, and an instrument secures the edges together. Staples are often used to close surgical cuts (incisions). Staples are faster to use than sutures, and they cause less skin reaction. Staples need to be removed using a tool that bends the staples away from your skin. HOW DO I CARE FOR MY WOUND CLOSURE?  Take medicines only as directed by your health care provider.  If you were prescribed an antibiotic medicine for your wound, finish it all even if you start to feel better.  Use ointments or creams only as directed by your health care provider.  Wash your hands with soap and water before and after touching your wound.  Do not soak your wound in water. Do not take baths, swim, or use a hot tub until your health care provider approves.  Ask your health care provider when you can start showering. Cover your wound if directed by your health care provider.  Do not take out your own sutures or staples.  Do not pick at your wound. Picking can cause an infection.  Keep all follow-up visits as directed by your health care provider. This is important. HOW LONG WILL I HAVE MY WOUND CLOSURE?  Leave adhesive glue on your skin until the glue peels away.  Leave adhesive strips on your skin until the strips fall off.  Absorbable sutures will dissolve within several days.  Nonabsorbable sutures and staples must be removed. The location of the wound will determine how long they stay in.  This can range from several days to a couple of weeks. WHEN SHOULD I SEEK HELP FOR MY WOUND CLOSURE? Contact your health care provider if:  You have a fever.  You have chills.  You have drainage, redness, swelling, or pain at your wound.  There is a bad smell coming from your wound.  The skin edges of your wound start to separate after your sutures have been removed.  Your wound becomes thick, raised, and darker in color after your sutures come out (scarring).   This information is not intended to replace advice given to you by your health care provider. Make sure you discuss any questions you have with your health care provider.   Document Released: 12/11/2000 Document Revised: 04/08/2014 Document Reviewed: 08/25/2013 Elsevier Interactive Patient Education [employer].

## 2015-08-23 NOTE — ED Provider Notes (Signed)
[facility] Emergency Department Provider Note  ____________________________________________  Time seen: Approximately 4:26 PM  I have reviewed the triage vital signs and the nursing notes.   HISTORY  Chief Complaint Head Laceration    HPI Anna [name] is a 70 y.o. female , NAD, presents to the emergency department with laceration to the forehead. 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 Was caring some items and not paying attention when she hit the lower portion of the carport about her forehead. Denies falling after the incident. Had no LOC, dizziness, visual changes. Has had headache at the site of the injury. Notes no significant bleeding at this time. Denies chest pain, shortness breath, numbness, weakness, tingling. Has not had any neck or back pain. No abdominal pain, nausea, vomiting. States that her last tetanus was over 5 years ago and is requesting booster of such today.   Past Medical History  Diagnosis Date  . History of melanoma excision RIGHT LEG  --  2012  . 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 Anxiety   . Kidney stone   . Cancer [facility])     Skin    Patient Active Problem List   Diagnosis Date Noted  . Sepsis ([place]) 10/31/2014  . Chest pain 12/10/2012  . Blurred vision, bilateral 12/10/2012  . Hematuria 12/10/2012  . Anxiety state, unspecified 12/10/2012  . Melanoma [facility])     Past Surgical History  Procedure Laterality Date  . Cervical biopsy  w/ loop electrode excision  2005  . Svt-cardiac ablation    . Breast surgery  1999    BREAST MASS EXCISED  . Tubal ligation    . Colposcopy    . Melanoma excised    . Laparoscopic cholecystectomy  05-16-2005  . Cardiac electrophysiology study and ablation  [place]  (NO ISSUES SINCE)  . Hysteroscopy w/d&c  02/13/2012    Procedure: DILATATION AND CURETTAGE  /HYSTEROSCOPY;  Surgeon: Bennetta Laos, MD;  Location: [facility];  Service: Gynecology;  Laterality: N/A;  deficit - 710    Current Outpatient Rx  Name  Route  Sig  Dispense  Refill  . ALPRAZolam (XANAX) 0.5 MG tablet      TAKE 1 TABLET AT BEDTIME AS NEEDED Patient taking differently: Take 0.5 mg by mouth at bedtime as needed for anxiety.    30 tablet   5   . fexofenadine (ALLEGRA) 180 MG tablet   Oral   Take 180 mg by mouth daily as needed for allergies.         . fluticasone (FLONASE) 50 MCG/ACT nasal spray   Each Nare   Place 1 spray into both nostrils daily as needed for rhinitis.          . hydrocortisone (ANUSOL-HC) 25 MG suppository   Rectal   Place 1 suppository (25 mg total) rectally 2 (two) times daily as needed for hemorrhoids.   24 suppository   1   . Polyethyl Glycol-Propyl Glycol (SYSTANE OP)   Ophthalmic   Apply 1 drop to eye as needed (for dry eyes).           Allergies Sulfa antibiotics and Azithromycin  Family History  Problem Relation Age of Onset  . Cancer Mother     ESOPHAGEAL CANCER  . Diabetes Father   . Hyperlipidemia Father   .  Heart disease Father   . Diabetes Brother   . Breast cancer Paternal Aunt     Age 49's  . Hyperlipidemia Paternal Grandmother   . Heart disease Paternal Grandmother   . Diabetes Paternal Grandfather   . Hyperlipidemia Paternal Grandfather   . Heart disease Paternal Grandfather     Social History Social History  Substance Use Topics  . Smoking status: Never Smoker   . Smokeless tobacco: Never Used  . Alcohol Use: 0.0 oz/week    0 Standard drinks or equivalent per week     Comment: rare     Review of Systems  Constitutional: No fatigue Eyes: No visual changes.  Cardiovascular: No chest pain. Respiratory: No shortness of breath. No wheezing.  Gastrointestinal: No abdominal pain.  No nausea, vomiting. Musculoskeletal: Negative for back, neck pain.  Skin: Positive laceration  about forehead. Negative for rash, bruising. Neurological: Negative for headaches, focal weakness or numbness. No tingling, LOC. 10-point ROS otherwise negative.  ____________________________________________   PHYSICAL EXAM:  VITAL SIGNS: ED Triage Vitals  Enc Vitals Group     BP 08/23/15 1612 156/80 mmHg     Pulse Rate 08/23/15 1612 84     Resp 08/23/15 1612 18     Temp 08/23/15 1612 97.7 F (36.5 C)     Temp Source 08/23/15 1612 Oral     SpO2 08/23/15 1612 97 %     Weight 08/23/15 1612 190 lb (86.183 kg)     Height 08/23/15 1612 5\' 8"  (1.727 m)     Head Cir --      Peak Flow --      Pain Score 08/23/15 1612 5     Pain Loc --      Pain Edu? --      Excl. in [place]? --      Constitutional: Alert and oriented. Well appearing and in no acute distress. Eyes: Conjunctivae are normal. PERRL. EOMI without pain.  Head: Normocephalic. Neck: Supple with full range of motion. Hematological/Lymphatic/Immunilogical: No cervical lymphadenopathy. Cardiovascular: Good peripheral circulation. Respiratory: Normal respiratory effort without tachypnea or retractions. Neurologic:  Normal speech and language. No gross focal neurologic deficits are appreciated. CN III-XII grossly in tact.  Skin:  Skin is warm, dry and intact. No rash noted. Psychiatric: Mood and affect are normal. Speech and behavior are normal. Patient exhibits appropriate insight and judgement.   ____________________________________________   LABS  None ____________________________________________  EKG  None ____________________________________________  RADIOLOGY  None ____________________________________________    PROCEDURES  Procedure(s) performed: LACERATION REPAIR Performed by: Braxton Feathers Authorized by: Braxton Feathers Consent: Verbal consent obtained. Risks and benefits: risks, benefits and alternatives were discussed Consent given by: patient Patient identity confirmed: provided demographic  data Prepped and Draped in normal sterile fashion Wound explored  Laceration Location: Mid forehead  Laceration Length: 2cm  No Foreign Bodies seen or palpated  Anesthesia: None  Irrigation method: syringe Amount of cleaning: standard - also cleansed with chlorhexidine  Skin closure: Liquiband  Patient tolerance: Patient tolerated the procedure well with no immediate complications.      Medications  Tdap (BOOSTRIX) injection 0.5 mL (not administered)     ____________________________________________   INITIAL IMPRESSION / ASSESSMENT AND PLAN / ED COURSE  Patient's diagnosis is consistent with laceration and contusion to forehead due to head injury. Patient had no history or physical exam evidence of significant head injury and had normal neurologic examination. Per the PECARN Rule, head CT was not indicated at this time. Wound  was closed with LiquiBand in which the patient tolerated well. She was given TD Booster and tolerated well without side effects. Patient will be discharged home with instructions for wound care. Patient is to follow up with her primary care provider if symptoms persist past this treatment course. Patient is given ED precautions to return to the ED for any worsening or new symptoms.    ____________________________________________  FINAL CLINICAL IMPRESSION(S) / ED DIAGNOSES  Final diagnoses:  Laceration of forehead without complication, initial encounter  Contusion of forehead, initial encounter  Head injury, initial encounter      NEW MEDICATIONS STARTED DURING THIS VISIT:  New Prescriptions   No medications on file         Braxton Feathers, PA-C 08/23/15 [place], MD 08/23/15 [phone]

## 2015-08-23 NOTE — ED Notes (Signed)
Discussed discharge instructions and follow-up care with patient. No questions or concerns at this time. Pt stable at discharge.  

## 2015-08-23 NOTE — ED Notes (Signed)
Patient to ER for c/o laceration to forehead. Patient states she walked under a low carport and hit head on metal. Did not knock patient down or cause LOC.

## 2016-01-11 ENCOUNTER — Other Ambulatory Visit: Payer: Self-pay | Admitting: Physician Assistant

## 2016-01-11 DIAGNOSIS — R1032 Left lower quadrant pain: Secondary | ICD-10-CM

## 2016-01-11 DIAGNOSIS — K625 Hemorrhage of anus and rectum: Secondary | ICD-10-CM

## 2016-01-15 ENCOUNTER — Ambulatory Visit
Admission: RE | Admit: 2016-01-15 | Discharge: 2016-01-15 | Disposition: A | Discharge status: Home / Self Care | Payer: Medicare Other | Source: Ambulatory Visit | Attending: Physician Assistant | Admitting: Physician Assistant

## 2016-01-15 DIAGNOSIS — R1032 Left lower quadrant pain: Secondary | ICD-10-CM

## 2016-01-15 DIAGNOSIS — K625 Hemorrhage of anus and rectum: Secondary | ICD-10-CM

## 2016-06-30 ENCOUNTER — Emergency Department
Admission: EM | Admit: 2016-06-30 | Discharge: 2016-06-30 | Disposition: A | Discharge status: Home / Self Care | Payer: Medicare Other | Attending: Emergency Medicine | Admitting: Emergency Medicine

## 2016-06-30 DIAGNOSIS — Z85828 Personal history of other malignant neoplasm of skin: Secondary | ICD-10-CM | POA: Diagnosis not present

## 2016-06-30 DIAGNOSIS — Z79899 Other long term (current) drug therapy: Secondary | ICD-10-CM | POA: Diagnosis not present

## 2016-06-30 DIAGNOSIS — B0230 Zoster ocular disease, unspecified: Secondary | ICD-10-CM

## 2016-06-30 DIAGNOSIS — B0050 Herpesviral ocular disease, unspecified: Secondary | ICD-10-CM | POA: Insufficient documentation

## 2016-06-30 DIAGNOSIS — R21 Rash and other nonspecific skin eruption: Secondary | ICD-10-CM | POA: Diagnosis present

## 2016-06-30 MED ORDER — HYDROXYZINE HCL 25 MG PO TABS: 25.0000 mg | ORAL_TABLET | Freq: Three times a day (TID) | ORAL | 0 refills | Status: AC | PRN

## 2016-06-30 MED ORDER — OXYCODONE-ACETAMINOPHEN 5-325 MG PO TABS
1.0000 | ORAL_TABLET | Freq: Once | ORAL | Status: AC
  Administered 2016-06-30: 1 via ORAL
  Filled 2016-06-30: qty 1

## 2016-06-30 MED ORDER — VALACYCLOVIR HCL 1 G PO TABS: 1000.0000 mg | ORAL_TABLET | Freq: Three times a day (TID) | ORAL | 0 refills | Status: AC

## 2016-06-30 MED ORDER — OXYCODONE-ACETAMINOPHEN 5-325 MG PO TABS: 1.0000 | ORAL_TABLET | Freq: Three times a day (TID) | ORAL | 0 refills | Status: AC | PRN

## 2016-06-30 NOTE — ED Provider Notes (Signed)
[facility] Emergency Department Provider Note  ____________________________________________  Time seen: Approximately 10:20 PM  I have reviewed the triage vital signs and the nursing notes.   HISTORY  Chief Complaint Rash    HPI Anna [name] is a 71 y.o. female presenting to the emergency department with an erythematous and macular rash along the trigeminal nerve dermatomal distribution that has occurred for the past 2 weeks. Patient states that rash started with pruritus but now feels like pins and needles. Patient rates rash pain at 10 out of 10 in intensity. Patient states "I couldn't wait until the morning" in reference to seeking medical care. Patient has noticed vesicular formation along the left upper cheek. Patient has also noticed blurry vision of the left eye. She denies photophobia, pain with extraocular eye movement, nausea and vomiting. Patient states that her daughter recently had a herpes zoster infection. Patient denies having similar rash in the past. Patient denies dysphagia, shortness of breath and chest tightness. She has been managing her own secretions. Patient denies otalgia. Patient denies new contact exposures such as the make up, creams, lotions and clothing. No alleviating measures at been attempted.   Past Medical History:  Diagnosis Date  . Anxiety   . Cancer (HCC)    Skin  . 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 History of melanoma excision RIGHT LEG  --  2012  . Kidney stone     Patient Active Problem List   Diagnosis Date Noted  . Sepsis ([place]) 10/31/2014  . Chest pain 12/10/2012  . Blurred vision, bilateral 12/10/2012  . Hematuria 12/10/2012  . Anxiety state, unspecified 12/10/2012  . Melanoma [facility])     Past Surgical History:  Procedure Laterality Date  . BREAST SURGERY  1999   BREAST MASS EXCISED  . CARDIAC ELECTROPHYSIOLOGY STUDY AND ABLATION  1997   SUCCESSFUL ABLATION OF WOLFE-PARKINSON-WHITE SYNDROME  (NO ISSUES SINCE)  . CERVICAL BIOPSY  W/ LOOP ELECTRODE EXCISION  2005  . COLPOSCOPY    . HYSTEROSCOPY W/D&C  02/13/2012   Procedure: DILATATION AND CURETTAGE /HYSTEROSCOPY;  Surgeon: Bennetta Laos, MD;  [facility];  Service: Gynecology;  Laterality: N/A;  deficit - 710  . LAPAROSCOPIC CHOLECYSTECTOMY  05-16-2005  . melanoma excised    . SVT-CARDIAC ABLATION    . TUBAL LIGATION      Prior to Admission medications   Medication Sig Start Date End Date Taking? Authorizing Provider  ALPRAZolam (XANAX) 0.5 MG tablet TAKE 1 TABLET AT BEDTIME AS NEEDED Patient taking differently: Take 0.5 mg by mouth at bedtime as needed for anxiety.  04/19/14   Anastasio Auerbach, MD  fexofenadine (ALLEGRA) 180 MG tablet Take 180 mg by mouth daily as needed for allergies.    Historical Provider, MD  fluticasone (FLONASE) 50 MCG/ACT nasal spray Place 1 spray into both nostrils daily as needed for rhinitis.     Historical Provider, MD  hydrocortisone (ANUSOL-HC) 25 MG suppository Place 1 suppository (25 mg total) rectally 2 (two) times daily as needed for hemorrhoids. 06/28/15   Anastasio Auerbach, MD  oxyCODONE-acetaminophen (ROXICET) 5-325 MG tablet Take 1 tablet by mouth every 8 (eight) hours as needed for severe pain. 06/30/16 07/05/16  Lannie Fields, PA-C  Polyethyl Glycol-Propyl Glycol (SYSTANE OP) Apply 1 drop to eye as needed (for dry eyes).    Historical Provider, MD  valACYclovir (VALTREX) 1000 MG tablet Take 1 tablet (  1,000 mg total) by mouth 3 (three) times daily. 06/30/16 07/07/16  Lannie Fields, PA-C    Allergies Iodinated diagnostic agents; Sulfa antibiotics; and Azithromycin  Family History  Problem Relation Age of Onset  . Cancer Mother     ESOPHAGEAL CANCER  . Diabetes Father   . Hyperlipidemia Father   . Heart disease Father   . Diabetes Brother   . Breast cancer Paternal Aunt     Age 67's  . Hyperlipidemia  Paternal Grandmother   . Heart disease Paternal Grandmother   . Diabetes Paternal Grandfather   . Hyperlipidemia Paternal Grandfather   . Heart disease Paternal Grandfather     Social History Social History  Substance Use Topics  . Smoking status: Never Smoker  . Smokeless tobacco: Never Used  . Alcohol use 0.0 oz/week     Comment: rare    Review of Systems  Constitutional: No fever/chills Eyes: Patient has blurry vision of left eye.  ENT: No upper respiratory complaints. Cardiovascular: no chest pain. Respiratory: no cough. No SOB. Gastrointestinal:  No nausea, no vomiting.  No diarrhea.  Musculoskeletal: Negative for musculoskeletal pain. Skin: Patient has rash.  Neurological: Negative for headaches, focal weakness or numbness.  ____________________________________________   PHYSICAL EXAM:  VITAL SIGNS: ED Triage Vitals  Enc Vitals Group     BP 06/30/16 2102 (!) 173/75     Pulse Rate 06/30/16 2102 84     Resp 06/30/16 2102 20     Temp 06/30/16 2102 97.7 F (36.5 C)     Temp Source 06/30/16 2102 Oral     SpO2 06/30/16 2102 97 %     Weight 06/30/16 2100 194 lb (88 kg)     Height 06/30/16 2100 5\' 8"  (1.727 m)     Head Circumference --      Peak Flow --      Pain Score 06/30/16 2100 7     Pain Loc --      Pain Edu? --      Excl. in [place]? --      Constitutional: Alert and oriented. Well appearing and in no acute distress. Eyes:  PERRL. EOMI. Patient has bilateral conjunctivitis with slight drooping of the lower eyelids. Head: Atraumatic. Ears: Tympanic membranes are pearly bilaterally. No erythema or vesicular formation of the external auditory canals visualized bilaterally. Cardiovascular: Normal rate, regular rhythm. Normal S1 and S2.  Good peripheral circulation. Respiratory: Normal respiratory effort without tachypnea or retractions. Lungs CTAB. Good air entry to the bases with no decreased or absent breath sounds. Musculoskeletal: Full range of motion to all  extremities. No gross deformities appreciated. Neurologic:  Normal speech and language. No gross focal neurologic deficits are appreciated.  Skin: Patient has an erythematous, macular rash with vesicular formation along the trigeminal nerve dermatomal distribution. Negative Hutchinson sign. Psychiatric: Mood and affect are normal. Speech and behavior are normal. Patient exhibits appropriate insight and judgement. ____________________________________________   LABS (all labs ordered are listed, but only abnormal results are displayed)  Labs Reviewed - No data to display ____________________________________________  EKG   ____________________________________________  RADIOLOGY   No results found.  ____________________________________________    PROCEDURES  Procedure(s) performed:    Procedures    Medications  oxyCODONE-acetaminophen (PERCOCET/ROXICET) 5-325 MG per tablet 1 tablet (not administered)     ____________________________________________   INITIAL IMPRESSION / ASSESSMENT AND PLAN / ED COURSE  Pertinent labs & imaging results that were available during my care of the patient were reviewed by me and  considered in my medical decision making (see chart for details).  Review of the [place] CSRS was performed in accordance of the [place] prior to dispensing any controlled drugs.    Herpes zoster ophthalmicus: Patient presents to the emergency department with an erythematous and macular rash with vesicular formation along the dermatomal distribution of the trigeminal nerve. Patient has also had decreased visual acuity of the left eye. Patient was given Roxicet in the emergency department for pain. Patient was discharged with Valtrex and Roxicet. Patient was advised during 2 different encounters of this emergency department visit to follow-up with ophthalmology on 07/01/2016. Patient voiced understanding regarding these recommendations. Patient was also advised not to drive.  Strict return precautions were given. Patient voiced understanding regarding these return precautions. Patient feels comfortable being discharged from the emergency department. All patient questions were answered.  ____________________________________________  FINAL CLINICAL IMPRESSION(S) / ED DIAGNOSES  Final diagnoses:  Herpes zoster ophthalmicus of left eye      NEW MEDICATIONS STARTED DURING THIS VISIT:  New Prescriptions   OXYCODONE-ACETAMINOPHEN (ROXICET) 5-325 MG TABLET    Take 1 tablet by mouth every 8 (eight) hours as needed for severe pain.   VALACYCLOVIR (VALTREX) 1000 MG TABLET    Take 1 tablet (1,000 mg total) by mouth 3 (three) times daily.        This chart was dictated using voice recognition software/Dragon. Despite best efforts to proofread, errors can occur which can change the meaning. Any change was purely unintentional.    Lannie Fields, PA-C 07/01/16 2037    Darel Hong, MD 07/01/16 2240

## 2016-06-30 NOTE — ED Notes (Signed)
Reports rash around left eye for approximately 1 weeks.  Reports now with severe pain at area and reports feels like pins sticking in her face.

## 2016-06-30 NOTE — ED Notes (Signed)

## 2016-10-21 ENCOUNTER — Ambulatory Visit
Admission: RE | Admit: 2016-10-21 | Discharge: 2016-10-21 | Disposition: A | Discharge status: Home / Self Care | Payer: Medicare Other | Source: Ambulatory Visit | Attending: Family Medicine | Admitting: Family Medicine

## 2016-10-21 ENCOUNTER — Other Ambulatory Visit: Payer: Self-pay | Admitting: Family Medicine

## 2016-10-21 DIAGNOSIS — R410 Disorientation, unspecified: Secondary | ICD-10-CM | POA: Diagnosis present

## 2016-10-21 DIAGNOSIS — R51 Headache: Principal | ICD-10-CM

## 2016-10-21 DIAGNOSIS — R519 Headache, unspecified: Secondary | ICD-10-CM

## 2016-10-21 DIAGNOSIS — R2681 Unsteadiness on feet: Secondary | ICD-10-CM | POA: Diagnosis present

## 2017-03-26 ENCOUNTER — Encounter: Payer: Self-pay | Admitting: Gynecology

## 2017-03-26 ENCOUNTER — Ambulatory Visit (INDEPENDENT_AMBULATORY_CARE_PROVIDER_SITE_OTHER): Payer: Medicare Other | Admitting: Gynecology

## 2017-03-26 VITALS — BP 126/80

## 2017-03-26 DIAGNOSIS — R300 Dysuria: Secondary | ICD-10-CM | POA: Diagnosis not present

## 2017-03-26 DIAGNOSIS — N760 Acute vaginitis: Secondary | ICD-10-CM

## 2017-03-26 DIAGNOSIS — N898 Other specified noninflammatory disorders of vagina: Secondary | ICD-10-CM | POA: Diagnosis not present

## 2017-03-26 LAB — WET PREP FOR TRICH, YEAST, CLUE

## 2017-03-26 NOTE — Progress Notes (Signed)
    Anna [name] June 17, 1945 [id number]        71 y.o.  G2P2002 presents with a one-week history of vaginal burning and discharge.  Also noticed an area around her clitoris when she did self exam.  Has recently been treated with antibiotic for "pneumonia".  Given cream to treat yeast in the folds of her panniculus.  Notes some urinary discomfort when she voids.  No significant frequency, urgency, low back pain, fever or chills.  No nausea vomiting diarrhea constipation.  No vaginal odor.  Past medical history,surgical history, problem list, medications, allergies, family history and social history were all reviewed and documented in the EPIC chart.  Directed ROS with pertinent positives and negatives documented in the history of present illness/assessment and plan.  Exam: Wandra Scot assistant Vitals:   03/26/17 1154  BP: 126/80   General appearance:  Normal Abdomen soft nontender without masses guarding rebound Pelvic external BUS vagina with atrophic changes.  Plaque-like white well-defined area left upper labia minora into the periclitoral region.  No significant vaginal discharge.  Cervix atrophic.  Uterus grossly normal midline mobile tenderness Physical Exam  Genitourinary:        Assessment/Plan:  71 y.o. 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  Recommended follow-up colposcopy with biopsy of this area and patient agrees to schedule.  Differential reviewed with her up to and including vulvar cancer.  Complaining of discharge with irritation.  Some dysuria.  Recent Levaquin antibiotic for pneumonia.  Was given Diflucan tablets to take prophylactically to prevent yeast.  Wet prep is negative today.  Given the treatment for yeast in the panniculus historically in recent antibiotics told her to take her Diflucan that we she already has a prescription before.  She had a total of 3 tablets and she will complete these daily.  Her urine  analysis appears contaminated but does show moderate bacteriuria.  Will follow up on culture and treat if shows dominant organism.  Patient will follow-up for her colposcopy and vulvar biopsy and then will go from there.   Anastasio Auerbach MD, 12:17 PM 03/26/2017

## 2017-03-26 NOTE — Addendum Note (Signed)
Addended by: Lorine Bears on: 03/26/2017 12:45 PM   Modules accepted: Orders

## 2017-03-26 NOTE — Patient Instructions (Signed)
Follow up for the biopsy appointment

## 2017-03-27 LAB — URINALYSIS W MICROSCOPIC + REFLEX CULTURE
Bacteria, UA: NONE SEEN /HPF
Bilirubin Urine: NEGATIVE
Glucose, UA: NEGATIVE
Hgb urine dipstick: NEGATIVE
Hyaline Cast: NONE SEEN /LPF
Ketones, ur: NEGATIVE
Leukocyte Esterase: NEGATIVE
Nitrites, Initial: NEGATIVE
Protein, ur: NEGATIVE
RBC / HPF: NONE SEEN /HPF (ref 0–2)
Specific Gravity, Urine: 1.007 (ref 1.001–1.03)
WBC, UA: NONE SEEN /HPF (ref 0–5)
pH: 5 (ref 5.0–8.0)

## 2017-03-27 LAB — NO CULTURE INDICATED

## 2017-04-15 ENCOUNTER — Encounter: Payer: Self-pay | Admitting: Gynecology

## 2017-04-15 ENCOUNTER — Ambulatory Visit (INDEPENDENT_AMBULATORY_CARE_PROVIDER_SITE_OTHER): Payer: Medicare Other | Admitting: Gynecology

## 2017-04-15 VITALS — BP 120/78

## 2017-04-15 DIAGNOSIS — N9089 Other specified noninflammatory disorders of vulva and perineum: Secondary | ICD-10-CM | POA: Diagnosis not present

## 2017-04-15 NOTE — Progress Notes (Signed)
    Anna [name] 1945-10-08 [id number]        72 y.o.  G2P2002 presents for vulvar biopsy.  Recently was seen for vaginitis where a plaque-like white lesion was noted left periclitoral region.  Returns now for biopsy.  Past medical history,surgical history, problem list, medications, allergies, family history and social history were all reviewed and documented in the EPIC chart.  Directed ROS with pertinent positives and negatives documented in the history of present illness/assessment and plan.  Exam: [name] assistant Vitals:   04/15/17 1525  BP: 120/78   General appearance:  Normal Pelvic external BUS vagina with atrophic changes.  Plaque-like white lesion left upper inner labia minora going onto the clitoral region.  No other lesions noted grossly.  No inguinal adenopathy noted.  Colposcopy was performed after acetic acid cleanse to the vulva with white plaque-like area but remained unchanged after acetic acid application.  No other lesions were noted on the vulva from the periclitoral region to the perianal region.  Procedure: The area surrounding and overlying the lesion was cleansed with Hibiclens, infiltrated with 1% lidocaine and the most inferior margin was biopsied.  Silver nitrate was applied for hemostasis.  Patient tolerated well.  Assessment/Plan:  72 y.o. [id number] with new onset white plaque-like lesion of the vulva.  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  She will follow-up for biopsy results.  Possible referral to gynecologic oncologist discussed.    Anastasio Auerbach MD, 3:57 PM 04/15/2017

## 2017-04-15 NOTE — Patient Instructions (Signed)
Office will call you with biopsy results 

## 2017-04-22 ENCOUNTER — Telehealth: Payer: Self-pay | Admitting: *Deleted

## 2017-04-22 NOTE — Telephone Encounter (Signed)
Patient called requesting pathology results from vulva biopsy from OV 04/15/17. I called Maudie Mercury [phone]) at pathology and she spoke with Dr. Lanae Crumbly and she has not signed off on bx yet, per Maudie Mercury she had to "cut bx in sections". And should be signed off today or tomorrow.

## 2017-04-23 NOTE — Telephone Encounter (Signed)
I spoke with pathologist today.  They have not signed out the final report.  Reassure patient that the biopsy results did not show cancer.  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  They had some issue interpreting one area and questioned whether a larger biopsy would be better.  My question is if we are going to take a larger specimen we may want to consider excising the whole area.  It is in a delicate area around the clitoris.  My recommendation would be to allow 1 of the GYN oncologist to examine her and see if they would proceed with excising this area as they deal with more extensive type surgeries in this area.  I do not want to call and set that appointment up until I get a written copy of the final path results to make sure everything matches.

## 2017-04-23 NOTE — Telephone Encounter (Signed)
Pt informed with all the below. Pt was very happy to receive any news

## 2017-04-23 NOTE — Telephone Encounter (Signed)
See below)Dr.Fontaine, I just wanted you to be aware Bx results has not yet posted to epic, I did call pathology twice regarding this and was told that [facility] has the Bx, but not signed off on it yet. I have related this information to patient as well. Patient is not happy about this and asked if you would call her, she is aware you are seeing patient's.

## 2017-04-25 ENCOUNTER — Telehealth: Payer: Self-pay | Admitting: *Deleted

## 2017-04-25 NOTE — Telephone Encounter (Signed)
Pt informed

## 2017-04-25 NOTE — Telephone Encounter (Signed)
Pt scheduled with Dr.Clark-Pearson on 05/09/17 @ 2:30pm, left message for pt to call.

## 2017-04-25 NOTE — Telephone Encounter (Signed)
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  I spoke to the pathologist and they had recommended a larger sample.  I think given the relative small nature of the lesion that probably excising the whole thing would be better.  Having said that given the location I would feel more comfortable to have a gynecologic oncologist take a look and see if they would excise this.   Arrange for appointment with gynecologic oncologist."

## 2017-04-25 NOTE — Telephone Encounter (Signed)
Jennifer from [facility] called and scheduled the patietn for an appt. Appt scheduled and she will notify the patient

## 2017-04-29 ENCOUNTER — Telehealth: Payer: Self-pay | Admitting: *Deleted

## 2017-04-29 NOTE — Telephone Encounter (Signed)
Pt informed

## 2017-04-29 NOTE — Telephone Encounter (Signed)
Patient annual scheduled with you on 05/02/17, has oncology appointment scheduled next week with Dr.Clark-Pearson on 05/09/17 due to "low-grade dysplasia bx". Should pt keep schedule annual visit with you,last seen for annual in 2017. I told pt I assume yes, she asked me to check with you. Please advise

## 2017-04-29 NOTE — Telephone Encounter (Signed)
Yes to keeping the annual appointment.

## 2017-05-02 ENCOUNTER — Encounter: Payer: Self-pay | Admitting: Gynecology

## 2017-05-02 ENCOUNTER — Ambulatory Visit (INDEPENDENT_AMBULATORY_CARE_PROVIDER_SITE_OTHER): Payer: Medicare Other | Admitting: Gynecology

## 2017-05-02 VITALS — BP 122/78 | Ht 67.0 in | Wt 200.2 lb

## 2017-05-02 DIAGNOSIS — Z01411 Encounter for gynecological examination (general) (routine) with abnormal findings: Secondary | ICD-10-CM | POA: Diagnosis not present

## 2017-05-02 DIAGNOSIS — K649 Unspecified hemorrhoids: Secondary | ICD-10-CM

## 2017-05-02 DIAGNOSIS — G4700 Insomnia, unspecified: Secondary | ICD-10-CM

## 2017-05-02 DIAGNOSIS — N900 Mild vulvar dysplasia: Secondary | ICD-10-CM

## 2017-05-02 DIAGNOSIS — N952 Postmenopausal atrophic vaginitis: Secondary | ICD-10-CM

## 2017-05-02 DIAGNOSIS — Z124 Encounter for screening for malignant neoplasm of cervix: Secondary | ICD-10-CM

## 2017-05-02 MED ORDER — ALPRAZOLAM 0.5 MG PO TABS: ORAL_TABLET | ORAL | 4 refills | Status: DC

## 2017-05-02 NOTE — Progress Notes (Addendum)
    Anna [name] 06-08-1945 [id number]        71 y.o.  G2P2002 for breast and pelvic exam.  Several issues noted below.  Past medical history,surgical history, problem list, medications, allergies, family history and social history were all reviewed and documented as reviewed in the EPIC chart.  ROS:  Performed with pertinent positives and negatives included in the history, assessment and plan.   Additional significant findings : None   Exam: Anna [name] assistant Vitals:   05/02/17 1100  BP: 122/78  Weight: 200 lb 3.2 oz (90.8 kg)  Height: 5\' 7"  (1.702 m)   Body mass index is 31.36 kg/m.  General appearance:  Normal affect, orientation and appearance. Skin: Grossly normal HEENT: Without gross lesions.  No cervical or supraclavicular adenopathy. Thyroid normal.  Lungs:  Clear without wheezing, rales or rhonchi Cardiac: RR, without RMG Abdominal:  Soft, nontender, without masses, guarding, rebound, organomegaly or hernia Breasts:  Examined lying and sitting without masses, retractions, discharge or axillary adenopathy. Pelvic:  Ext, BUS, Vagina: With atrophic changes.  Plaque-like white area upper inner left labia minora involving the clitoral hood  Cervix: With atrophic changes.  Pap smear done  Uterus: Anteverted, normal size, shape and contour, midline and mobile nontender   Adnexa: Without masses or tenderness    Anus and perineum: Normal excepting old external hemorrhoids  Rectovaginal: Normal sphincter tone without palpated masses or tenderness.    Assessment/Plan:  72 y.o. [id number] female for breast and pelvic exam.   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  Tangential cuts make it difficult to rule out early invasive disease.  I have asked her to see a gynecologic oncologist less than I am concerned about invasive disease but more because excising this area would involve the periclitoral region and that they may be more  comfortable with this.  I think regardless she will need this area excised.  She has an appointment to see Anna [name] next week. 2. Postmenopausal/atrophic genital changes.  No significant hot flushes, night sweats, vaginal dryness or bleeding.  Continue to monitor and report any issues or bleeding. 3. Pap smear 2017.  Patient requests Pap smear now.  Pap smear done.  No history of abnormal Pap smears previously.  We discussed current screening guidelines with options to stop screening based on age. 4. Mammography 03/2017.  Continue with annual mammography when due.  Breast exam normal today. 5. DEXA 2016 normal.  Plan repeat DEXA at 5-year interval. 6. Insomnia.  Uses Xanax 5 mg as needed to help with sleep.  Has done so for years without side effects.  #30 with 4 refills provided. 7. Colonoscopy 2013.  Repeat at their recommended interval. 8. Health maintenance.  No routine lab work done as patient does this elsewhere.  Follow-up 1 year, sooner as needed.   Anna Auerbach MD, 11:34 AM 05/02/2017

## 2017-05-02 NOTE — Addendum Note (Signed)
Addended by: Nelva Nay on: 05/02/2017 12:10 PM   Modules accepted: Orders

## 2017-05-02 NOTE — Patient Instructions (Signed)
Follow-up with Dr. Fermin Schwab as arranged.  Follow-up in 1 year for annual exam.

## 2017-05-02 NOTE — Addendum Note (Signed)
Addended by: Anastasio Auerbach on: 05/02/2017 11:40 AM   Modules accepted: Orders

## 2017-05-08 LAB — PAP IG W/ RFLX HPV ASCU

## 2017-05-09 ENCOUNTER — Inpatient Hospital Stay: Payer: Medicare Other | Attending: Gynecology | Admitting: Gynecology

## 2017-05-09 ENCOUNTER — Encounter: Payer: Self-pay | Admitting: Gynecology

## 2017-05-09 VITALS — BP 153/75 | HR 75 | Temp 97.5°F | Resp 18 | Wt 198.1 lb

## 2017-05-09 DIAGNOSIS — N893 Dysplasia of vagina, unspecified: Secondary | ICD-10-CM | POA: Insufficient documentation

## 2017-05-09 NOTE — Patient Instructions (Signed)
Plan to have a wide local excision of the vulva at the [facility] on June 23, 2017 at [place] will receive a phone call from the pre-surgical RN to discuss instructions.  Please call for any questions or concerns.

## 2017-05-09 NOTE — Progress Notes (Signed)
Consult Note: Gyn-Onc   Anna [name] 72 y.o. female  Chief Complaint  Patient presents with  . 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  This will be scheduled in the [place] outpatient operating room in the near future.  The procedure risks and benefits were reviewed with the patient all of her questions were answered.  HPI: 72 year-old white female gravida 2 para 2 seen in consultation at the request of Dr. Donalynn Furlong regarding management of a vulvar lesion.  Patient reports that she has had several months of vulvar irritation in this particular area.  Does cause some dysuria when urine is touched the area that she is noticed on the vulva.  Dr. Phineas Real has biopsied the lesion which reveals "at least" low-grade squamous intraepithelial lesion although tangential sectioning of the edge cannot entirely exclude invasive carcinoma.  Patient reports no prior gynecologic history.  Review of Systems:10 point review of systems is negative except as noted in interval history.   Vitals: Blood pressure (!) 153/75, pulse 75, temperature (!) 97.5 F (36.4 C), temperature source Oral, resp. rate 18, weight 198 lb 1.6 oz (89.9 kg), SpO2 100 %.  Physical Exam: General : The patient is a healthy woman in no acute distress.  HEENT: normocephalic, extraoccular movements normal; neck is supple without thyromegally  Lynphnodes: Supraclavicular and inguinal nodes not enlarged  Abdomen: Soft, non-tender, no ascites, no organomegally, no masses, no hernias  Pelvic:  EGBUS: Normal female on the left upper labia minora near the clitoris is a less than 1 cm raised thick white plaque. Vagina: Normal, no lesions  Urethra and Bladder: Normal, non-tender  Cervix: Menopausal Uterus: Difficult  to outline secondary to habitus. Bi-manual examination: Non-tender; no adenxal masses or nodularity  Rectal: normal sphincter tone, no masses, no blood  Lower extremities: No edema or varicosities. Normal range of motion      Allergies  Allergen Reactions  . Iodinated Diagnostic Agents Hypertension    Patient experienced elevated heart rate, hypertension and had to be taken to the ED  . Sulfa Antibiotics Hives and Nausea Only  . Azithromycin Diarrhea, Palpitations and Other (See Comments)    Reaction:  Cramping     Past Medical History:  Diagnosis Date  . Anxiety   . Cancer (HCC)    Skin  . 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 History of melanoma excision RIGHT LEG  --  2012  . Kidney stone     Past Surgical History:  Procedure Laterality Date  . BREAST SURGERY  1999   BREAST MASS EXCISED  . CARDIAC ELECTROPHYSIOLOGY STUDY AND ABLATION  1997   SUCCESSFUL ABLATION OF WOLFE-PARKINSON-WHITE SYNDROME  (NO ISSUES SINCE)  . CERVICAL BIOPSY  W/ LOOP ELECTRODE EXCISION  2005  . COLPOSCOPY    . HYSTEROSCOPY W/D&C  02/13/2012   Procedure: DILATATION AND CURETTAGE /HYSTEROSCOPY;  Surgeon: Bennetta Laos, MD;  Location: [facility];  Service: Gynecology;  Laterality: N/A;  deficit - 710  . LAPAROSCOPIC CHOLECYSTECTOMY  05-16-2005  . melanoma excised    . SVT-CARDIAC ABLATION    . TUBAL LIGATION      Current Outpatient Medications  Medication Sig Dispense Refill  . ALPRAZolam (XANAX) 0.5 MG tablet  TAKE 1 TABLET AT BEDTIME AS NEEDED 30 tablet 4  . fluconazole (DIFLUCAN) 150 MG tablet take 1 tablet by mouth immediately may repeat IF SYMPTOMS RECUR AFTER 3 DAYS  0  . hydrocortisone (ANUSOL-HC) 25 MG suppository Place 1 suppository (25 mg total) rectally 2 (two) times daily as needed for hemorrhoids. 24 suppository 1  . Polyethyl Glycol-Propyl Glycol (SYSTANE OP) Apply 1 drop to eye as needed (for dry  eyes).     No current facility-administered medications for this visit.     Social History   Socioeconomic History  . Marital status: Divorced    Spouse name: Not on file  . Number of children: Not on file  . Years of education: Not on file  . Highest education level: Not on file  Social Needs  . Financial resource strain: Not on file  . Food insecurity - worry: Not on file  . Food insecurity - inability: Not on file  . Transportation needs - medical: Not on file  . Transportation needs - non-medical: Not on file  Occupational History  . Not on file  Tobacco Use  . Smoking status: Never Smoker  . Smokeless tobacco: Never Used  Substance and Sexual Activity  . Alcohol use: Yes    Alcohol/week: 0.0 oz    Comment: rare  . Drug use: No  . Sexual activity: Not Currently    Birth control/protection: Surgical    Comment: 1st intercourse 72 yo--Fewer than 5 partners  Other Topics Concern  . Not on file  Social History Narrative  . Not on file    Family History  Problem Relation Age of Onset  . Cancer Mother        ESOPHAGEAL CANCER  . Diabetes Father   . Hyperlipidemia Father   . Heart disease Father   . Diabetes Brother   . Breast cancer Paternal Aunt        Age 78's  . Hyperlipidemia Paternal Grandmother   . Heart disease Paternal Grandmother   . Diabetes Paternal Grandfather   . Hyperlipidemia Paternal Grandfather   . Heart disease Paternal Grandfather       Marti Sleigh, MD 05/09/2017, 4:10 PM

## 2017-06-16 ENCOUNTER — Encounter (HOSPITAL_BASED_OUTPATIENT_CLINIC_OR_DEPARTMENT_OTHER): Payer: Self-pay | Admitting: *Deleted

## 2017-06-16 ENCOUNTER — Telehealth: Payer: Self-pay | Admitting: *Deleted

## 2017-06-16 NOTE — Telephone Encounter (Signed)
Returned the patient's call and advised her that [place] and Dr. Fermin Schwab is out of the office. Advised her that his next date after 3/25 would possible be 4/26; patient ok with that day. Advised her that we ill cll her back tomorrow.

## 2017-06-17 ENCOUNTER — Telehealth: Payer: Self-pay | Admitting: *Deleted

## 2017-06-17 NOTE — Telephone Encounter (Signed)
Called and left the patient a message that per her request we have moved the procedure to April 26th

## 2017-07-14 ENCOUNTER — Encounter (HOSPITAL_BASED_OUTPATIENT_CLINIC_OR_DEPARTMENT_OTHER): Payer: Self-pay

## 2017-07-14 ENCOUNTER — Other Ambulatory Visit: Payer: Self-pay

## 2017-07-14 NOTE — Progress Notes (Signed)
Spoke with:  Santiago Glad NPO:  After Midnight, no gum, candy, or mints   Arrival time: 0630 AM Labs:  None AM medications:  Xanax if needed Pre op orders:  Yes Ride home:  Vinnie Level (daughter) [phone]

## 2017-07-24 NOTE — Anesthesia Preprocedure Evaluation (Addendum)
Anesthesia Evaluation  Patient identified by MRN, date of birth, ID band Patient awake    Reviewed: Allergy & Precautions, H&P , NPO status , Patient's Chart, lab work & pertinent test results, reviewed documented beta blocker date and time   Airway Mallampati: II  TM Distance: >3 FB Neck ROM: Full    Dental no notable dental hx.    Pulmonary neg pulmonary ROS,    Pulmonary exam normal breath sounds clear to auscultation       Cardiovascular Exercise Tolerance: Good + Peripheral Vascular Disease  Normal cardiovascular exam Rhythm:Regular Rate:Normal  H/O WPW s/p successful ablation in 1997  Echo 9/14  Left ventricle: The cavity size was normal. Wall thickness was increased in a pattern of mild LVH. Systolic function was normal. The estimated ejection fraction was in the range of 55% to 60%. Wall motion was normal; there were no regional wall motion abnormalities.   Neuro/Psych Anxiety negative neurological ROS     GI/Hepatic negative GI ROS, Neg liver ROS,   Endo/Other  negative endocrine ROS  Renal/GU negative Renal ROS  negative genitourinary   Musculoskeletal negative musculoskeletal ROS (+)   Abdominal   Peds negative pediatric ROS (+)  Hematology negative hematology ROS (+)   Anesthesia Other Findings   Reproductive/Obstetrics negative OB ROS                            Anesthesia Physical  Anesthesia Plan  ASA: III  Anesthesia Plan: MAC   Post-op Pain Management:    Induction: Intravenous  PONV Risk Score and Plan: 2 and Treatment may vary due to age or medical condition  Airway Management Planned: Nasal Cannula, Natural Airway and Mask  Additional Equipment:   Intra-op Plan:   Post-operative Plan: Extubation in OR  Informed Consent: I have reviewed the patients History and Physical, chart, labs and discussed the procedure including the risks, benefits and  alternatives for the proposed anesthesia with the patient or authorized representative who has indicated his/her understanding and acceptance.   Dental advisory given  Plan Discussed with: CRNA, Anesthesiologist and Surgeon  Anesthesia Plan Comments:       Anesthesia Quick Evaluation

## 2017-07-25 ENCOUNTER — Ambulatory Visit (HOSPITAL_BASED_OUTPATIENT_CLINIC_OR_DEPARTMENT_OTHER): Payer: Medicare Other | Admitting: Anesthesiology

## 2017-07-25 ENCOUNTER — Encounter (HOSPITAL_BASED_OUTPATIENT_CLINIC_OR_DEPARTMENT_OTHER)
Admission: RE | Disposition: A | Discharge status: Home / Self Care | Payer: Self-pay | Source: Ambulatory Visit | Attending: Gynecology

## 2017-07-25 ENCOUNTER — Other Ambulatory Visit: Payer: Self-pay

## 2017-07-25 ENCOUNTER — Inpatient Hospital Stay: Payer: Medicare Other | Admitting: Gynecology

## 2017-07-25 ENCOUNTER — Ambulatory Visit (HOSPITAL_BASED_OUTPATIENT_CLINIC_OR_DEPARTMENT_OTHER)
Admission: RE | Admit: 2017-07-25 | Discharge: 2017-07-25 | Disposition: A | Discharge status: Home / Self Care | Payer: Medicare Other | Source: Ambulatory Visit | Attending: Gynecology | Admitting: Gynecology

## 2017-07-25 ENCOUNTER — Encounter (HOSPITAL_BASED_OUTPATIENT_CLINIC_OR_DEPARTMENT_OTHER): Payer: Self-pay | Admitting: *Deleted

## 2017-07-25 DIAGNOSIS — F419 Anxiety disorder, unspecified: Secondary | ICD-10-CM | POA: Insufficient documentation

## 2017-07-25 DIAGNOSIS — Z91041 Radiographic dye allergy status: Secondary | ICD-10-CM | POA: Diagnosis not present

## 2017-07-25 DIAGNOSIS — Z85820 Personal history of malignant melanoma of skin: Secondary | ICD-10-CM | POA: Insufficient documentation

## 2017-07-25 DIAGNOSIS — N903 Dysplasia of vulva, unspecified: Secondary | ICD-10-CM | POA: Diagnosis present

## 2017-07-25 DIAGNOSIS — I739 Peripheral vascular disease, unspecified: Secondary | ICD-10-CM | POA: Diagnosis not present

## 2017-07-25 DIAGNOSIS — Z79899 Other long term (current) drug therapy: Secondary | ICD-10-CM | POA: Insufficient documentation

## 2017-07-25 DIAGNOSIS — A630 Anogenital (venereal) warts: Secondary | ICD-10-CM | POA: Insufficient documentation

## 2017-07-25 DIAGNOSIS — Z881 Allergy status to other antibiotic agents status: Secondary | ICD-10-CM | POA: Diagnosis not present

## 2017-07-25 DIAGNOSIS — Z8249 Family history of ischemic heart disease and other diseases of the circulatory system: Secondary | ICD-10-CM | POA: Insufficient documentation

## 2017-07-25 DIAGNOSIS — Z882 Allergy status to sulfonamides status: Secondary | ICD-10-CM | POA: Diagnosis not present

## 2017-07-25 HISTORY — DX: Unspecified hemorrhoids: K64.9

## 2017-07-25 HISTORY — DX: Supraventricular tachycardia: I47.1

## 2017-07-25 HISTORY — DX: Unspecified cataract: H26.9

## 2017-07-25 HISTORY — DX: Personal history of other malignant neoplasm of skin: Z98.890

## 2017-07-25 HISTORY — DX: Personal history of other diseases of the circulatory system: Z86.79

## 2017-07-25 HISTORY — DX: Personal history of other diseases of the digestive system: Z87.19

## 2017-07-25 HISTORY — DX: Lyme disease, unspecified: A69.20

## 2017-07-25 HISTORY — PX: VULVECTOMY: SHX1086

## 2017-07-25 HISTORY — DX: Other specified postprocedural states: Z85.828

## 2017-07-25 HISTORY — DX: Reserved for concepts with insufficient information to code with codable children: IMO0002

## 2017-07-25 HISTORY — DX: Peripheral vascular disease, unspecified: I73.9

## 2017-07-25 HISTORY — DX: Disorder of arteries and arterioles, unspecified: I77.9

## 2017-07-25 SURGERY — WIDE EXCISION VULVECTOMY
Anesthesia: Monitor Anesthesia Care | Site: Vulva

## 2017-07-25 MED ORDER — MEPERIDINE HCL 25 MG/ML IJ SOLN
6.2500 mg | INTRAMUSCULAR | Status: DC | PRN
  Filled 2017-07-25: qty 1

## 2017-07-25 MED ORDER — BUPIVACAINE HCL (PF) 0.25 % IJ SOLN
INTRAMUSCULAR | Status: DC | PRN
  Administered 2017-07-25: 1 mL

## 2017-07-25 MED ORDER — LIDOCAINE 2% (20 MG/ML) 5 ML SYRINGE
INTRAMUSCULAR | Status: AC
  Filled 2017-07-25: qty 5

## 2017-07-25 MED ORDER — PROPOFOL 500 MG/50ML IV EMUL
INTRAVENOUS | Status: DC | PRN
  Administered 2017-07-25: 100 ug/kg/min via INTRAVENOUS

## 2017-07-25 MED ORDER — FENTANYL CITRATE (PF) 100 MCG/2ML IJ SOLN
INTRAMUSCULAR | Status: DC | PRN
  Administered 2017-07-25 (×2): 50 ug via INTRAVENOUS

## 2017-07-25 MED ORDER — FENTANYL CITRATE (PF) 100 MCG/2ML IJ SOLN
INTRAMUSCULAR | Status: AC
  Filled 2017-07-25: qty 2

## 2017-07-25 MED ORDER — FENTANYL CITRATE (PF) 100 MCG/2ML IJ SOLN
25.0000 ug | INTRAMUSCULAR | Status: DC | PRN
  Administered 2017-07-25 (×2): 25 ug via INTRAVENOUS
  Filled 2017-07-25: qty 1

## 2017-07-25 MED ORDER — LIDOCAINE HCL (CARDIAC) PF 100 MG/5ML IV SOSY
PREFILLED_SYRINGE | INTRAVENOUS | Status: DC | PRN
  Administered 2017-07-25: 100 mg via INTRAVENOUS

## 2017-07-25 MED ORDER — ONDANSETRON HCL 4 MG/2ML IJ SOLN
INTRAMUSCULAR | Status: AC
  Filled 2017-07-25: qty 2

## 2017-07-25 MED ORDER — LACTATED RINGERS IV SOLN
INTRAVENOUS | Status: DC
  Administered 2017-07-25: 08:00:00 via INTRAVENOUS
  Filled 2017-07-25: qty 1000

## 2017-07-25 MED ORDER — KETOROLAC TROMETHAMINE 30 MG/ML IJ SOLN
INTRAMUSCULAR | Status: AC
  Filled 2017-07-25: qty 2

## 2017-07-25 MED ORDER — DEXAMETHASONE SODIUM PHOSPHATE 4 MG/ML IJ SOLN
INTRAMUSCULAR | Status: DC | PRN
  Administered 2017-07-25: 10 mg via INTRAVENOUS

## 2017-07-25 MED ORDER — ONDANSETRON HCL 4 MG/2ML IJ SOLN
INTRAMUSCULAR | Status: DC | PRN
  Administered 2017-07-25: 4 mg via INTRAVENOUS

## 2017-07-25 MED ORDER — DEXAMETHASONE SODIUM PHOSPHATE 10 MG/ML IJ SOLN
INTRAMUSCULAR | Status: AC
  Filled 2017-07-25: qty 1

## 2017-07-25 SURGICAL SUPPLY — 19 items
BLADE SURG 15 STRL LF DISP TIS (BLADE) ×1 IMPLANT
BLADE SURG 15 STRL SS (BLADE) ×2
CANISTER SUCT 3000ML PPV (MISCELLANEOUS) IMPLANT
CANISTER SUCTION 1200CC (MISCELLANEOUS) IMPLANT
CATH ROBINSON RED A/P 16FR (CATHETERS) ×1 IMPLANT
GLOVE BIO SURGEON STRL SZ7.5 (GLOVE) ×4 IMPLANT
GOWN STRL REUS W/TWL XL LVL3 (GOWN DISPOSABLE) ×2 IMPLANT
KIT TURNOVER CYSTO (KITS) ×2 IMPLANT
NDL HYPO 25X1 1.5 SAFETY (NEEDLE) ×1 IMPLANT
NEEDLE HYPO 25X1 1.5 SAFETY (NEEDLE) ×2 IMPLANT
NS IRRIG 500ML POUR BTL (IV SOLUTION) IMPLANT
PACK VAGINAL WOMENS (CUSTOM PROCEDURE TRAY) ×2 IMPLANT
PAD OB MATERNITY 4.3X12.25 (PERSONAL CARE ITEMS) ×2 IMPLANT
SCOPETTES 8  STERILE (MISCELLANEOUS) ×2
SCOPETTES 8 STERILE (MISCELLANEOUS) ×2 IMPLANT
SUT VIC AB 3-0 SH 27 (SUTURE) ×4
SUT VIC AB 3-0 SH 27X BRD (SUTURE) ×2 IMPLANT
TOWEL OR 17X24 6PK STRL BLUE (TOWEL DISPOSABLE) ×4 IMPLANT
WATER STERILE IRR 500ML POUR (IV SOLUTION) ×2 IMPLANT

## 2017-07-25 NOTE — Op Note (Signed)
[place]  female MEDICAL RECORD [id number] DATE OF BIRTH: 09-03-1945 PHYSICIAN: Marti Sleigh, M.D  07/25/2017   OPERATIVE REPORT  PREOPERATIVE DIAGNOSIS: Vulvar dysplasia  POSTOPERATIVE DIAGNOSIS: Same  PROCEDURE: Partial simple vulvectomy  SURGEON: Marti Sleigh, M.D   ANESTHESIA: Local and MAC ESTIMATED BLOOD LOSS: Minimal  SURGICAL FINDINGS: Examination of the vulva showed a 1 cm white raised lesion on the left anterior vulva adjacent to the clitoris.  No other lesions were noted.  PROCEDURE: Patient brought to the operating room and MAC anesthesia was instituted.  Patient was placed in lithotomy position in [place].  The vulva was prepped with chlorhexidine.  Patient was draped.  Surgical timeout was taken.  The vulvar lesion was identified and using a scalpel excised with approximate 5 mm margin.  Hemostasis achieved with cautery.  Incision was closed with a running subcuticular suture of 3-0 Vicryl and some additional interrupted 3-0 Vicryl sutures in a mattress fashion.  Blood loss was minimal..  Patient was awakened from anesthesia taken to recovery room in stable condition.  Sponge needle isthmic counts correct x2.   Marti Sleigh, M.D

## 2017-07-25 NOTE — Transfer of Care (Signed)
Immediate Anesthesia Transfer of Care Note  Patient: Novelle Addair  Procedure(s) Performed: WIDE EXCISION VULVECTOMY (N/A )  Patient Location: PACU  Anesthesia Type:MAC  Level of Consciousness: awake, alert  and oriented  Airway & Oxygen Therapy: Patient Spontanous Breathing and Patient connected to nasal cannula oxygen  Post-op Assessment: Report given to RN and Post -op Vital signs reviewed and stable  Post vital signs: Reviewed and stable  Last Vitals:  Vitals Value Taken Time  BP 119/59 07/25/2017  8:56 AM  Temp 36.4 C 07/25/2017  8:56 AM  Pulse 77 07/25/2017  8:58 AM  Resp 9 07/25/2017  8:58 AM  SpO2 100 % 07/25/2017  8:58 AM  Vitals shown include unvalidated device data.  Last Pain:  Vitals:   07/25/17 0713  TempSrc:   PainSc: 0-No pain      Patients Stated Pain Goal: 5 (26/33/35 4562)  Complications: No apparent anesthesia complications

## 2017-07-25 NOTE — H&P (Signed)
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  This will be scheduled in the [place] outpatient operating room in the near future.  The procedure risks and benefits were reviewed with the patient all of her questions were answered.  HPI: 72 year-old white female gravida 2 para 2 seen in consultation at the request of Dr. Donalynn Furlong regarding management of a vulvar lesion.  Patient reports that she has had several months of vulvar irritation in this particular area.  Does cause some dysuria when urine is touched the area that she is noticed on the vulva.  Dr. Phineas Real has biopsied the lesion which reveals "at least" low-grade squamous intraepithelial lesion although tangential sectioning of the edge cannot entirely exclude invasive carcinoma.  Patient reports no prior gynecologic history.  Review of Systems:10 point review of systems is negative except as noted in interval history.   Vitals: Blood pressure (!) 153/75, pulse 75, temperature (!) 97.5 F (36.4 C), temperature source Oral, resp. rate 18, weight 198 lb 1.6 oz (89.9 kg), SpO2 100 %.  Physical Exam: General : The patient is a healthy woman in no acute distress.  HEENT: normocephalic, extraoccular movements normal; neck is supple without thyromegally  Lynphnodes: Supraclavicular and inguinal nodes not enlarged  Abdomen: Soft, non-tender, no ascites, no organomegally, no masses, no hernias  Pelvic:  EGBUS: Normal female on the left upper labia minora near the clitoris is a less than 1 cm raised thick white plaque. Vagina: Normal, no lesions  Urethra and Bladder: Normal, non-tender  Cervix: Menopausal Uterus: Difficult to outline secondary to habitus. Bi-manual examination: Non-tender; no adenxal masses or nodularity  Rectal: normal  sphincter tone, no masses, no blood  Lower extremities: No edema or varicosities. Normal range of motion           Allergies  Allergen Reactions  . Iodinated Diagnostic Agents Hypertension    Patient experienced elevated heart rate, hypertension and had to be taken to the ED  . Sulfa Antibiotics Hives and Nausea Only  . Azithromycin Diarrhea, Palpitations and Other (See Comments)    Reaction:  Cramping         Past Medical History:  Diagnosis Date  . Anxiety   . Cancer (HCC)    Skin  . 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 History of melanoma excision RIGHT LEG  --  2012  . Kidney stone          Past Surgical History:  Procedure Laterality Date  . BREAST SURGERY  1999   BREAST MASS EXCISED  . CARDIAC ELECTROPHYSIOLOGY STUDY AND ABLATION  1997   SUCCESSFUL ABLATION OF WOLFE-PARKINSON-WHITE SYNDROME  (NO ISSUES SINCE)  . CERVICAL BIOPSY  W/ LOOP ELECTRODE EXCISION  2005  . COLPOSCOPY    . HYSTEROSCOPY W/D&C  02/13/2012   Procedure: DILATATION AND CURETTAGE /HYSTEROSCOPY;  Surgeon: Bennetta Laos, MD;  Location: [facility];  Service: Gynecology;  Laterality: N/A;  deficit - 710  . LAPAROSCOPIC CHOLECYSTECTOMY  05-16-2005  . melanoma excised    . SVT-CARDIAC ABLATION    . TUBAL LIGATION            Current Outpatient Medications  Medication Sig Dispense Refill  . ALPRAZolam (XANAX) 0.5 MG tablet TAKE 1 TABLET AT BEDTIME  AS NEEDED 30 tablet 4  . fluconazole (DIFLUCAN) 150 MG tablet take 1 tablet by mouth immediately may repeat IF SYMPTOMS RECUR AFTER 3 DAYS  0  . hydrocortisone (ANUSOL-HC) 25 MG suppository Place 1 suppository (25 mg total) rectally 2 (two) times daily as needed for hemorrhoids. 24 suppository 1  . Polyethyl Glycol-Propyl Glycol (SYSTANE OP) Apply 1 drop to eye as needed (for dry eyes).     No current facility-administered medications  for this visit.     Social History        Socioeconomic History  . Marital status: Divorced    Spouse name: Not on file  . Number of children: Not on file  . Years of education: Not on file  . Highest education level: Not on file  Social Needs  . Financial resource strain: Not on file  . Food insecurity - worry: Not on file  . Food insecurity - inability: Not on file  . Transportation needs - medical: Not on file  . Transportation needs - non-medical: Not on file  Occupational History  . Not on file  Tobacco Use  . Smoking status: Never Smoker  . Smokeless tobacco: Never Used  Substance and Sexual Activity  . Alcohol use: Yes    Alcohol/week: 0.0 oz    Comment: rare  . Drug use: No  . Sexual activity: Not Currently    Birth control/protection: Surgical    Comment: 1st intercourse 72 yo--Fewer than 5 partners  Other Topics Concern  . Not on file  Social History Narrative  . Not on file         Family History  Problem Relation Age of Onset  . Cancer Mother        ESOPHAGEAL CANCER  . Diabetes Father   . Hyperlipidemia Father   . Heart disease Father   . Diabetes Brother   . Breast cancer Paternal Aunt        Age 50's  . Hyperlipidemia Paternal Grandmother   . Heart disease Paternal Grandmother   . Diabetes Paternal Grandfather   . Hyperlipidemia Paternal Grandfather   . Heart disease Paternal Grandfather       Marti Sleigh, MD

## 2017-07-25 NOTE — Discharge Instructions (Signed)

## 2017-07-25 NOTE — Anesthesia Postprocedure Evaluation (Signed)
Anesthesia Post Note  Patient: Pernie Grosso  Procedure(s) Performed: WIDE EXCISION VULVECTOMY (N/A Vulva)     Patient location during evaluation: PACU Anesthesia Type: MAC Level of consciousness: awake and alert Pain management: pain level controlled Vital Signs Assessment: post-procedure vital signs reviewed and stable Respiratory status: spontaneous breathing, nonlabored ventilation, respiratory function stable and patient connected to nasal cannula oxygen Cardiovascular status: stable and blood pressure returned to baseline Postop Assessment: no apparent nausea or vomiting Anesthetic complications: no    Last Vitals:  Vitals:   07/25/17 1000 07/25/17 1030  BP: 116/65 117/68  Pulse: 65 71  Resp: 11 16  Temp:  36.6 C  SpO2: 92% 96%    Last Pain:  Vitals:   07/25/17 1030  TempSrc: Oral  PainSc: 0-No pain                 Breslin Burklow

## 2017-07-25 NOTE — Interval H&P Note (Signed)
History and Physical Interval Note:  07/25/2017 8:11 AM  Anna [name]  has presented today for surgery, with the diagnosis of VULVAR DYSPLASIA  The various methods of treatment have been discussed with the patient and family. After consideration of risks, benefits and other options for treatment, the patient has consented to  Procedure(s): WIDE EXCISION VULVECTOMY (N/A) as a surgical intervention .  The patient's history has been reviewed, patient examined, no change in status, stable for surgery.  I have reviewed the patient's chart and labs.  Questions were answered to the patient's satisfaction.     Marti Sleigh

## 2017-07-28 ENCOUNTER — Encounter (HOSPITAL_BASED_OUTPATIENT_CLINIC_OR_DEPARTMENT_OTHER): Payer: Self-pay | Admitting: Gynecology

## 2017-07-28 ENCOUNTER — Telehealth: Payer: Self-pay | Admitting: *Deleted

## 2017-07-28 ENCOUNTER — Other Ambulatory Visit: Payer: Self-pay

## 2017-07-28 ENCOUNTER — Telehealth: Payer: Self-pay

## 2017-07-28 ENCOUNTER — Other Ambulatory Visit: Payer: Self-pay | Admitting: Gynecologic Oncology

## 2017-07-28 DIAGNOSIS — R9431 Abnormal electrocardiogram [ECG] [EKG]: Secondary | ICD-10-CM

## 2017-07-28 DIAGNOSIS — G8918 Other acute postprocedural pain: Secondary | ICD-10-CM

## 2017-07-28 MED ORDER — HYDROCODONE-ACETAMINOPHEN 5-325 MG PO TABS: 1.0000 | ORAL_TABLET | ORAL | 0 refills | Status: DC | PRN

## 2017-07-28 NOTE — Telephone Encounter (Signed)
She needs to follow-up with the surgeon who did the procedure.

## 2017-07-28 NOTE — Telephone Encounter (Signed)
Pt informed and Dr.Clark-Pearson office has called patient back and will be prescribing medication for patient.

## 2017-07-28 NOTE — Telephone Encounter (Signed)
Pt left VM regarding she has been in pain all weekend due to had surgery on Friday and Ibuprofen didn't help.  Notified Joylene John NP and I returned pt's call.  Spoke with pt and referred to previous note per [facility], as pt reported area as throbbing and slight swelling.  Pt agreed and reports she has been using ice pack they gave her and reports it has helped some but really needed something stronger for pain than Ibuprofen.  She reported she has used Hydrocodone in the past and didn't have any problem taking it.  She would like it called into [facility] at [employer] location.  Told pt to call us if any worsening symptoms ie fever, pain med not helping. Pt voiced understanding. She asked if we could review her EKG notes from Friday pre op and see if she needs to follow up with a cardiologist.  Told her I will notify Joylene John NP and call pt back later this afternoon.

## 2017-07-28 NOTE — Progress Notes (Signed)
Per Joylene John NP- can refer pt to cardiology or we can send her EKG from Friday to her cardiologist of choice.  Contacted pt, per [facility] NP I reassured pt that if there was something concerning they would not have proceeded with surgery but we do recommend getting back with cardiologist to have EKGs reviewed and just in case make him aware.  Per pt, she doesn 't have a cardiologist now and would like [name] to refer her.  Contacted Heart care with [facility] and spoke with Janett Billow.  I put in referral per [facility] NP and faxed notes and EKG to their office fax, [phone]. I told pt to expect a call from them soon and gave her their office contact info.

## 2017-07-28 NOTE — Telephone Encounter (Signed)
Patient called had wide excision vulvectomy with Dr.Clark-Pearson on 07/25/17 said she had a rough weekend with pain in this area. Pt said she spoke with someone at [facility] today they call today to check on her and when she told them she was in pain, they told her to call here for pain medication. I asked patient what type of pain she is having and she said throbbing feeling,irratation and area appears to be swollen. No fever, chills etc. She said Dr.Clark-Pearson didn't give her any pain medication told her to take ibuprofen 600 mg as needed for pain. Pt said she take ibuprofen. I suggested she call his office and consult with the nurse and let them know about this pain and her not being able to take ibuprofen, as he is the doctor that did her surgery. Agree?

## 2017-07-29 ENCOUNTER — Telehealth: Payer: Self-pay

## 2017-07-29 NOTE — Telephone Encounter (Signed)
Sent referral to scheduling 

## 2017-07-29 NOTE — Telephone Encounter (Signed)
Told Anna [name] that the surgical pathology showed no dysplasia or cancer. Pt stated that the pain is better today. She is applying ice as directed and using the norco as needed.

## 2017-08-12 ENCOUNTER — Inpatient Hospital Stay: Payer: Medicare Other | Attending: Gynecology | Admitting: Gynecology

## 2017-08-12 ENCOUNTER — Encounter: Payer: Self-pay | Admitting: Gynecology

## 2017-08-12 VITALS — BP 143/69 | HR 80 | Temp 97.5°F | Resp 18 | Ht 68.0 in | Wt 198.3 lb

## 2017-08-12 DIAGNOSIS — N893 Dysplasia of vagina, unspecified: Secondary | ICD-10-CM | POA: Insufficient documentation

## 2017-08-12 DIAGNOSIS — A630 Anogenital (venereal) warts: Secondary | ICD-10-CM

## 2017-08-12 DIAGNOSIS — D071 Carcinoma in situ of vulva: Secondary | ICD-10-CM

## 2017-08-12 NOTE — Patient Instructions (Signed)
Plan to follow up with Dr. Phineas Real as scheduled.  Please have your PCP review your EKG to see if any intervention is needed.  No need to follow up with Dr. Fermin Schwab at this time unless issues arise in the future.

## 2017-08-12 NOTE — Progress Notes (Signed)
Consult Note: Gyn-Onc   Anna [name] 72 y.o. female  Chief Complaint  Patient presents with  . 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  Plan: Pathology report was reviewed with the patient.  We are happy that this was not VIN.  She no return to the care of Dr. Phineas Real.  We will give the patient a copy of her EKG so that she can share it with her primary care physician.  Interval history: Patient underwent wide local excision of a suspicious lesion on July 25, 2017.  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  The patient had an uncomplicated postoperative course once the surgical pain resolved.  She denies any bleeding or any discharge.  HPI: 72 year-old white female gravida 2 para 2 seen in consultation at the request of Dr. Donalynn Furlong regarding management of a vulvar lesion.  Patient reports that she has had several months of vulvar irritation in this particular area.  Does cause some dysuria when urine is touched the area that she is noticed on the vulva.  Dr. Phineas Real has biopsied the lesion which reveals "at least" low-grade squamous intraepithelial lesion although tangential sectioning of the edge cannot entirely exclude invasive carcinoma.  Patient reports no prior gynecologic history.  Her graph on July 25, 2017 the patient underwent wide local excision of the lesion.  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  Review of Systems:10 point review of systems is negative except as noted in interval history.   Vitals: Blood pressure (!) 143/69, pulse 80, temperature (!) 97.5 F (36.4 C), temperature source Oral, resp. rate 18, height 5\' 8"  (1.727 m), weight 198 lb 4.8 oz (89.9 kg), SpO2 100 %.  Physical Exam: General : The patient is a healthy woman in no acute distress.  HEENT: normocephalic, extraoccular  movements normal; neck is supple without thyromegally  Lynphnodes: Supraclavicular and inguinal nodes not enlarged  Abdomen: Soft, non-tender, no ascites, no organomegally, no masses, no hernias  Pelvic:  EGBUS: Normal female.  The surgical site still has Vicryl sutures intact.  Is healing well there is no evidence of infection.   Lower extremities: No edema or varicosities. Normal range of motion      Allergies  Allergen Reactions  . Iodinated Diagnostic Agents Hypertension    Patient experienced elevated heart rate, hypertension and had to be taken to the ED  . Sulfa Antibiotics Hives and Nausea Only  . Azithromycin Diarrhea, Palpitations and Other (See Comments)    Reaction:  Cramping   . Other Rash    Alpha gal Red meat Stomach pain  . Wheat Bran Rash    Stomach pain    Past Medical History:  Diagnosis Date  . Anxiety   . Bilateral cataracts   . CIN I (cervical intraepithelial neoplasia I)   . Hemorrhoid   . History of basal cell carcinoma (BCC) excision   . History of gallstones   . History of melanoma excision RIGHT LEG  --  2012  . History of Wolff-Parkinson-White (WPW) syndrome    1997  s/p  ablation  . Lyme disease   . Mild carotid artery disease ([place])    per duplex 12-10-2012  bilateral ICA 1-39% stenosis  . Squamous carcinoma    face beside nose, multiple areas  . SVT (supraventricular tachycardia) ([employer]) 10/31/2014  Past Surgical History:  Procedure Laterality Date  . BREAST SURGERY Left 1999   BREAST MASS EXCISED, benign  . CARDIAC ELECTROPHYSIOLOGY STUDY AND ABLATION  1997   SUCCESSFUL ABLATION OF WOLFE-PARKINSON-WHITE SYNDROME  (NO ISSUES SINCE)  . CATARACT EXTRACTION, BILATERAL    . CERVICAL BIOPSY  W/ LOOP ELECTRODE EXCISION  2005   dr Cherylann Banas  . COLONOSCOPY    . COLPOSCOPY    . HYSTEROSCOPY W/D&C  02/13/2012   Procedure: DILATATION AND CURETTAGE /HYSTEROSCOPY;  Surgeon: Bennetta Laos, MD;  Location[facility];   Service: Gynecology;  Laterality: N/A;  deficit - 710  . LAPAROSCOPIC CHOLECYSTECTOMY  05-16-2005  dr Brantley Stage [facility]  . melanoma excised    . SVT-CARDIAC ABLATION    . TRANSTHORACIC ECHOCARDIOGRAM  12-10-2012   dr Wynonia Lawman   mild LVH, ef 55-60%/  mild MR/  mild LAE and RAE  . TUBAL LIGATION    . UPPER GI ENDOSCOPY    . VULVECTOMY N/A 07/25/2017   Procedure: WIDE EXCISION VULVECTOMY;  Surgeon: Marti Sleigh, MD;  Location: [facility];  Service: Gynecology;  Laterality: N/A;    Current Outpatient Medications  Medication Sig Dispense Refill  . ALPRAZolam (XANAX) 0.5 MG tablet TAKE 1 TABLET AT BEDTIME AS NEEDED 30 tablet 4  . EPINEPHrine 0.3 mg/0.3 mL IJ SOAJ injection Inject into the muscle as needed.    . fluconazole (DIFLUCAN) 150 MG tablet take 1 tablet by mouth immediately may repeat IF SYMPTOMS RECUR AFTER 3 DAYS  0  . HYDROcodone-acetaminophen (NORCO/VICODIN) 5-325 MG tablet Take 1-2 tablets by mouth every 4 (four) hours as needed for moderate pain. 30 tablet 0  . Polyethyl Glycol-Propyl Glycol (SYSTANE OP) Apply 1 drop to eye as needed (for dry eyes).    . predniSONE (DELTASONE) 10 MG tablet Take 10 mg by mouth as needed.     No current facility-administered medications for this visit.     Social History   Socioeconomic History  . Marital status: Divorced    Spouse name: Not on file  . Number of children: Not on file  . Years of education: Not on file  . Highest education level: Not on file  Occupational History  . Not on file  Social Needs  . Financial resource strain: Not on file  . Food insecurity:    Worry: Not on file    Inability: Not on file  . Transportation needs:    Medical: Not on file    Non-medical: Not on file  Tobacco Use  . Smoking status: Never Smoker  . Smokeless tobacco: Never Used  Substance and Sexual Activity  . Alcohol use: Yes    Alcohol/week: 0.0 oz    Comment: rare  . Drug use: No  . Sexual activity: Not on file     Comment: 1st intercourse 72 yo--Fewer than 5 partners  Lifestyle  . Physical activity:    Days per week: Not on file    Minutes per session: Not on file  . Stress: Not on file  Relationships  . Social connections:    Talks on phone: Not on file    Gets together: Not on file    Attends religious service: Not on file    Active member of club or organization: Not on file    Attends meetings of clubs or organizations: Not on file    Relationship status: Not on file  . Intimate partner violence:    Fear of current or ex partner:  Not on file    Emotionally abused: Not on file    Physically abused: Not on file    Forced sexual activity: Not on file  Other Topics Concern  . Not on file  Social History Narrative  . Not on file    Family History  Problem Relation Age of Onset  . Cancer Mother        ESOPHAGEAL CANCER  . Diabetes Father   . Hyperlipidemia Father   . Heart disease Father   . Diabetes Brother   . Breast cancer Paternal Aunt        Age 79's  . Hyperlipidemia Paternal Grandmother   . Heart disease Paternal Grandmother   . Diabetes Paternal Grandfather   . Hyperlipidemia Paternal Grandfather   . Heart disease Paternal Grandfather       Marti Sleigh, MD 08/12/2017, 2:54 PM

## 2018-05-07 ENCOUNTER — Encounter: Payer: Medicare Other | Admitting: Gynecology

## 2018-06-03 ENCOUNTER — Encounter: Payer: Self-pay | Admitting: Gynecology

## 2018-06-03 ENCOUNTER — Telehealth: Payer: Self-pay | Admitting: *Deleted

## 2018-06-03 ENCOUNTER — Encounter: Payer: Self-pay | Admitting: Allergy

## 2018-06-03 ENCOUNTER — Ambulatory Visit (INDEPENDENT_AMBULATORY_CARE_PROVIDER_SITE_OTHER): Payer: Medicare Other | Admitting: Gynecology

## 2018-06-03 ENCOUNTER — Ambulatory Visit (INDEPENDENT_AMBULATORY_CARE_PROVIDER_SITE_OTHER): Payer: Medicare Other | Admitting: Allergy

## 2018-06-03 VITALS — BP 124/82 | Ht 67.0 in | Wt 207.0 lb

## 2018-06-03 VITALS — BP 130/80 | HR 74 | Resp 20 | Ht 67.0 in | Wt 205.8 lb

## 2018-06-03 DIAGNOSIS — R300 Dysuria: Secondary | ICD-10-CM | POA: Diagnosis not present

## 2018-06-03 DIAGNOSIS — G4700 Insomnia, unspecified: Secondary | ICD-10-CM

## 2018-06-03 DIAGNOSIS — T781XXD Other adverse food reactions, not elsewhere classified, subsequent encounter: Secondary | ICD-10-CM | POA: Diagnosis not present

## 2018-06-03 DIAGNOSIS — J3089 Other allergic rhinitis: Secondary | ICD-10-CM | POA: Diagnosis not present

## 2018-06-03 DIAGNOSIS — N952 Postmenopausal atrophic vaginitis: Secondary | ICD-10-CM

## 2018-06-03 DIAGNOSIS — H1013 Acute atopic conjunctivitis, bilateral: Secondary | ICD-10-CM | POA: Diagnosis not present

## 2018-06-03 DIAGNOSIS — Z9189 Other specified personal risk factors, not elsewhere classified: Secondary | ICD-10-CM

## 2018-06-03 DIAGNOSIS — Z01419 Encounter for gynecological examination (general) (routine) without abnormal findings: Secondary | ICD-10-CM

## 2018-06-03 MED ORDER — OLOPATADINE HCL 0.7 % OP SOLN: 1.0000 [drp] | Freq: Every day | OPHTHALMIC | 5 refills | Status: DC | PRN

## 2018-06-03 NOTE — Progress Notes (Signed)
New Patient Note  RE: Anna [name] MRN: [id number] DOB: 10-07-1945 Date of Office Visit: 06/03/2018  Referring provider: Arta Silence, MD GI Primary care provider: Kirk Ruths, MD  Chief Complaint:  Food allergy  History of present illness: Anna [name] is a 72 y.o. female presenting today for consultation for food allergy.    She reports she had lyme disease in 2016 after a tick bite and was hospitalized for 3 days related to this.  She states she went for a period of time where she was eating red meats.  She states no one told her to not eat red meat after the tick bite.  She saw her ENT about a year ago who performed alpha gal testing which was positive as well as wheat and dairy.  Over the past year she has been trying her best to avoid red meats, wheat and dairy but states it is hard to completely avoid.  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   She states prior to the tick bite she hadn't had any symptoms around food ingestion.  After the tick bite she started noticing she would get facial flushing, peeling around her eyes when she would eat red meats.  The symptoms were delayed in onset and she states sometimes she can eat red meats and nothing happens.   She also states she has reactions where she will get SOB and facial flushing.  She has been to dermalogist when she has the skin peeling around her eyes.   She states prior to the testing returning positive she hadn't noticed symptoms related to wheat or dairy ingestion.  However she does states she will get GI upset and diarrhea and is thinking it is related to dairy or wheat ingestion.  She states she does eat a lot of [employer] on the road but does not report any significant symptoms after ingestion.    Her testing from 05/2017 showed in kU/L: chicken 1.13, [place] 0.2, garlic 9.92, milk 4.26, wheat 1.12, corn 0.28, soybean 0.1, yeast 0.12,  malt 0.2, beef 0.76, alpha gal 0.39.    She reports having itchy, watery eyes, occasional runny nose and does report sneezing spells.   She takes zyrtec daily.    She sees Dr. Mervin Kung in GI as she has been having a lot of cramping and reflux.  She is planning to have  endoscopy upcoming.    Denies history of asthma or eczema.    Review of systems: Review of Systems  Constitutional: Negative for chills, fever and malaise/fatigue.  HENT: Negative for congestion, ear discharge, ear pain, nosebleeds and sore throat.   Eyes: Negative for pain, discharge and redness.  Respiratory: Negative for cough, shortness of breath and wheezing.   Cardiovascular: Negative for chest pain.  Gastrointestinal: Negative for abdominal pain, constipation, diarrhea, heartburn, nausea and vomiting.  Musculoskeletal: Negative for joint pain.  Skin: Negative for itching and rash.  Neurological: Negative for headaches.    All other systems negative unless noted above in HPI  Past medical history: Past Medical History:  Diagnosis Date  . Anxiety   . Bilateral cataracts   . CIN I (cervical intraepithelial neoplasia I)   . Hemorrhoid   . History of basal cell carcinoma (BCC) excision   . History of gallstones   . History of melanoma excision RIGHT LEG  --  2012  . History of  Wolff-Parkinson-White (WPW) syndrome    1997  s/p  ablation  . Lyme disease   . Mild carotid artery disease ([place])    per duplex 12-10-2012  bilateral ICA 1-39% stenosis  . Squamous carcinoma    face beside nose, multiple areas  . SVT (supraventricular tachycardia) ([place]) 10/31/2014    Past surgical history: Past Surgical History:  Procedure Laterality Date  . BREAST SURGERY Left 1999   BREAST MASS EXCISED, benign  . CARDIAC ELECTROPHYSIOLOGY STUDY AND ABLATION  1997   SUCCESSFUL ABLATION OF WOLFE-PARKINSON-WHITE SYNDROME  (NO ISSUES SINCE)  . CATARACT EXTRACTION, BILATERAL    . CERVICAL BIOPSY  W/ LOOP ELECTRODE EXCISION  2005    dr Cherylann Banas  . COLONOSCOPY    . COLPOSCOPY    . HYSTEROSCOPY W/D&C  02/13/2012   Procedure: DILATATION AND CURETTAGE /HYSTEROSCOPY;  Surgeon: Bennetta Laos, MD;  Location[facility];  Service: Gynecology;  Laterality: N/A;  deficit - 710  . LAPAROSCOPIC CHOLECYSTECTOMY  05-16-2005  dr Brantley Stage [facility]  . melanoma excised    . SVT-CARDIAC ABLATION    . TRANSTHORACIC ECHOCARDIOGRAM  12-10-2012   dr Wynonia Lawman   mild LVH, ef 55-60%/  mild MR/  mild LAE and RAE  . TUBAL LIGATION    . UPPER GI ENDOSCOPY    . VULVECTOMY N/A 07/25/2017   Procedure: WIDE EXCISION VULVECTOMY;  Surgeon: Marti Sleigh, MD;  Location[facility];  Service: Gynecology;  Laterality: N/A;    Family history:  Family History  Problem Relation Age of Onset  . Cancer Mother        ESOPHAGEAL CANCER  . Diabetes Father   . Hyperlipidemia Father   . Heart disease Father   . Diabetes Brother   . Breast cancer Paternal Aunt        Age 51's  . Hyperlipidemia Paternal Grandmother   . Heart disease Paternal Grandmother   . Diabetes Paternal Grandfather   . Hyperlipidemia Paternal Grandfather   . Heart disease Paternal Grandfather     Social history: Lives in an townhome with carpeting with electric heating and central cooling. Dot in the home.  No concern for water damage, mildew or roaches in the home.   Medication List: Allergies as of 06/03/2018      Reactions   Iodinated Diagnostic Agents Hypertension   Patient experienced elevated heart rate, hypertension and had to be taken to the ED   Sulfa Antibiotics Hives, Nausea Only   Azithromycin Diarrhea, Palpitations, Other (See Comments)   Reaction:  Cramping    Other Rash   Alpha gal Red meat Stomach pain   Wheat Bran Rash   Stomach pain      Medication List       Accurate as of June 03, 2018 11:44 AM. Always use your most recent med list.        ALPRAZolam 0.5 MG tablet Commonly known as:  XANAX TAKE 1 TABLET  AT BEDTIME AS NEEDED   cetirizine 10 MG tablet Commonly known as:  ZYRTEC Take 10 mg by mouth daily.   EPINEPHrine 0.3 mg/0.3 mL Soaj injection Commonly known as:  EPI-PEN Inject into the muscle as needed.   fluticasone 50 MCG/ACT nasal spray Commonly known as:  FLONASE Place into both nostrils daily.   Olopatadine HCl 0.7 % Soln Commonly known as:  PAZEO Place 1 drop into both eyes daily as needed.       Known medication allergies: Allergies  Allergen Reactions  .  Iodinated Diagnostic Agents Hypertension    Patient experienced elevated heart rate, hypertension and had to be taken to the ED  . Sulfa Antibiotics Hives and Nausea Only  . Azithromycin Diarrhea, Palpitations and Other (See Comments)    Reaction:  Cramping   . Other Rash    Alpha gal Red meat Stomach pain  . Wheat Bran Rash    Stomach pain     Physical examination: Blood pressure 130/80, pulse 74, resp. rate 20, height 5\' 7"  (1.702 m), weight 205 lb 12.8 oz (93.4 kg).  General: Alert, interactive, in no acute distress. HEENT: PERRLA, TMs pearly gray, turbinates non-edematous without discharge, post-pharynx non erythematous. Neck: Supple without lymphadenopathy. Lungs: Clear to auscultation without wheezing, rhonchi or rales. {no increased work of breathing. CV: Normal S1, S2 without murmurs. Abdomen: Nondistended, nontender. Skin: Warm and dry, without lesions or rashes. Extremities:  No clubbing, cyanosis or edema. Neuro:   Grossly intact.  Diagnositics/Labs: Labs: see HPI  Allergy testing: Environmental allergy skin prick testing is positive to Timothy, ragweed giant, oak, pecan, Aspergillus, Mucor Plumbeus, dust mites, cockroach.  Select food allergy skin prick testing is positive to wheat and shrimp.  Negative to peanut, milk, egg, cashew, pecan, walnut, almond, pork, [place], chicken, beef, lamb, tomato, white potato, onion, garlic. Allergy testing results were read and interpreted by provider,  documented by clinical staff.   Assessment and plan:   Adverse food reaction   - skin testing today to select food allergens are positive to wheat and shrimp  - continue avoidance of red meats, wheat products, shrimp at this time  - have access to self-injectable epinephrine (Epipen 0.3mg  at all times  - follow emergency action plan in case of allergic reaction  - will obtain alpha gal IgE panel, shellfish panel, fish panel, wheat, corn, chicken, milk  Allergic rhinitis with conjunctivitis  - environmental allergy skin testing today is positive to timothy grass, ragweed, oak tree, pecan tree, molds, dust mites, cockroach  - allergen avoidance measures discussed/hand-out provided  - continue zyrtec 10mg  daily  - for nasal congestion/drainage can use OTC Flonase or Nasacort 2 sprays each nostril daily as needed.    - for itchy/watery/red eyes can use Pazeo 1 drop each eye daily as needed   Follow-up 4-6 months or sooner if needed I appreciate the opportunity to take part in [place] care. Please do not hesitate to contact me with questions.  Sincerely,   Prudy Feeler, MD Allergy/Immunology [facility] [place] [facility] [place]

## 2018-06-03 NOTE — Patient Instructions (Signed)
Follow-up in 1 year for annual exam, sooner as needed. 

## 2018-06-03 NOTE — Progress Notes (Signed)
    Anna [name] 06/20/1945 [id number]        73 y.o.  [id number] for breast and pelvic exam.  Had wide local excision of a leukoplakic area of the upper left labia minora this past year.  Final pathology showed condylomatous changes but no significant VIN.  Notes some very mild intermittent dysuria that comes and goes.  No frequency urgency low back pain fever or chills.  Past medical history,surgical history, problem list, medications, allergies, family history and social history were all reviewed and documented as reviewed in the EPIC chart.  ROS:  Performed with pertinent positives and negatives included in the history, assessment and plan.   Additional significant findings : None   Exam: [name] assistant Vitals:   06/03/18 1538  BP: 124/82  Weight: 207 lb (93.9 kg)  Height: 5\' 7"  (1.702 m)   Body mass index is 32.42 kg/m.  General appearance:  Normal affect, orientation and appearance. Skin: Grossly normal HEENT: Without gross lesions.  No cervical or supraclavicular adenopathy. Thyroid normal.  Lungs:  Clear without wheezing, rales or rhonchi Cardiac: RR, without RMG Abdominal:  Soft, nontender, without masses, guarding, rebound, organomegaly or hernia Breasts:  Examined lying and sitting without masses, retractions, discharge or axillary adenopathy. Pelvic:  Ext, BUS, Vagina: With atrophic changes.  No abnormality seen  Cervix: With atrophic changes  Uterus: Anteverted, normal size, shape and contour, midline and mobile nontender   Adnexa: Without masses or tenderness    Anus and perineum: Normal   Rectovaginal: Normal sphincter tone without palpated masses or tenderness.    Assessment/Plan:  73 y.o. [id number] female for annual gynecologic exam.  1. Postmenopausal.  No significant menopausal symptoms or any vaginal bleeding. 2. History of wide local excision of the vulva which turned out to be condylomatous changes.  Exam today is negative except for atrophic  changes.  We will continue with self-exam and report any changes. 3. Pap smear 2019.  No Pap smear done today.  No history of abnormal Pap smears previously. 4. Colonoscopy 2013.  Repeat at their recommended interval. 5. Mammography today.  Breast exam normal today. 6. DEXA 2016 normal.  Recommend repeat DEXA next year at 5-year interval. 7. Insomnia.  Uses Xanax 0.5 mg nightly as needed.  Her primary physician is trying to wean her but per patient is suggesting starting antidepressant.  Patient is very reluctant to do so.  Feels she has a good response to Xanax and has no side effects.  She does have a supply at home but will call if she needs refill. 8. Health maintenance.  No routine lab work done as patient does this elsewhere.  Follow-up 1 year, sooner as needed.   Anastasio Auerbach MD, 4:04 PM 06/03/2018

## 2018-06-03 NOTE — Telephone Encounter (Signed)
Oh no.  Did pharmacy give [name] any options.    Other options would be Optivar (azelastine),  Ketotifen (which is also OTC), Bepreve.

## 2018-06-03 NOTE — Patient Instructions (Addendum)
Adverse food reaction   - skin testing today to select food allergens are positive to wheat and shrimp  - continue avoidance of red meats, wheat products, shrimp at this time  - have access to self-injectable epinephrine (Epipen 0.3mg  at all times  - follow emergency action plan in case of allergic reaction  - will obtain alpha gal IgE panel, shellfish panel, fish panel, wheat, corn, chicken, milk  Allergies  - environmental allergy skin testing today is positive to timothy grass, ragweed, oak tree, pecan tree, molds, dust mites, cockroach  - allergen avoidance measures discussed/hand-out provided  - continue zyrtec 10mg  daily  - for nasal congestion/drainage can use OTC Flonase or Nasacort 2 sprays each nostril daily as needed.    - for itchy/watery/red eyes can use Pazeo 1 drop each eye daily as needed   Follow-up 4-6 months or sooner if needed

## 2018-06-03 NOTE — Telephone Encounter (Signed)
Pazeo is over $200 patient cannot use coupon due to her having medicare please advise

## 2018-06-04 MED ORDER — AZELASTINE HCL 0.05 % OP SOLN: OPHTHALMIC | 5 refills | Status: DC

## 2018-06-04 NOTE — Telephone Encounter (Signed)
optivar 1 drop each eye twice a day as needed for watery, itchy, red eyes

## 2018-06-04 NOTE — Addendum Note (Signed)
Addended by: Farrel Demark R on: 06/04/2018 10:09 AM   Modules accepted: Orders

## 2018-06-04 NOTE — Telephone Encounter (Signed)
Prescription for optivar sent in.

## 2018-06-04 NOTE — Telephone Encounter (Signed)
Optivar (azelastine) is covered. How would you like this prescribed? Please advise.

## 2018-06-05 ENCOUNTER — Other Ambulatory Visit: Payer: Self-pay | Admitting: *Deleted

## 2018-06-05 LAB — URINALYSIS, COMPLETE W/RFL CULTURE
Bilirubin Urine: NEGATIVE
Glucose, UA: NEGATIVE
Hgb urine dipstick: NEGATIVE
Hyaline Cast: NONE SEEN /LPF
Ketones, ur: NEGATIVE
Nitrites, Initial: POSITIVE — AB
Protein, ur: NEGATIVE
Specific Gravity, Urine: 1.021 (ref 1.001–1.03)
pH: <=5 (ref 5.0–8.0)

## 2018-06-05 LAB — URINE CULTURE
MICRO NUMBER:: 281006
SPECIMEN QUALITY:: ADEQUATE

## 2018-06-05 LAB — CULTURE INDICATED

## 2018-06-05 MED ORDER — CIPROFLOXACIN HCL 250 MG PO TABS: 250.0000 mg | ORAL_TABLET | Freq: Two times a day (BID) | ORAL | 0 refills | Status: DC

## 2018-06-06 LAB — ALLERGEN PROFILE, FOOD-FISH
Allergen Mackerel IgE: <0.1 kU/L
Allergen Salmon IgE: <0.1 kU/L
Allergen Trout IgE: <0.1 kU/L
Allergen Walley Pike IgE: 0.36 kU/L — AB
Codfish IgE: <0.1 kU/L
Halibut IgE: 0.29 kU/L — AB
Tuna: 1.75 kU/L — AB

## 2018-06-06 LAB — ALLERGEN PROFILE, SHELLFISH
Clam IgE: 4.56 kU/L — AB
F023-IgE Crab: 5.05 kU/L — AB
F080-IgE Lobster: 5.74 kU/L — AB
F290-IgE Oyster: 0.39 kU/L — AB
Scallop IgE: 5.65 kU/L — AB
Shrimp IgE: 13.9 kU/L — AB

## 2018-06-06 LAB — ALPHA-GAL PANEL
Alpha Gal IgE*: 14.8 kU/L — ABNORMAL HIGH (ref <0.10)
Beef (Bos spp) IgE: 8.34 kU/L — ABNORMAL HIGH (ref <0.35)
Class Interpretation: 2
Class Interpretation: 2
Class Interpretation: 3
Lamb/Mutton (Ovis spp) IgE: 1.1 kU/L — ABNORMAL HIGH (ref <0.35)
Pork (Sus spp) IgE: 3.1 kU/L — ABNORMAL HIGH (ref <0.35)

## 2018-06-06 LAB — ALLERGEN, WHEAT, F4: Wheat IgE: 1.86 kU/L — AB

## 2018-06-06 LAB — ALLERGEN, CORN F8: Allergen Corn, IgE: 0.38 kU/L — AB

## 2018-06-06 LAB — ALLERGEN, FLOUNDER, RF337: Allergen Flounder IgE: <0.1 kU/L

## 2018-06-06 LAB — ALLERGEN, CHICKEN F83: Chicken IgE: 2.26 kU/L — AB

## 2018-06-06 LAB — ALLERGEN MILK: Milk IgE: 1.15 kU/L — AB

## 2018-06-10 ENCOUNTER — Telehealth: Payer: Self-pay | Admitting: *Deleted

## 2018-06-10 MED ORDER — EPINEPHRINE 0.3 MG/0.3ML IJ SOAJ: INTRAMUSCULAR | 2 refills | Status: DC

## 2018-06-10 NOTE — Telephone Encounter (Signed)
Dr Nelva Bush can you sign order as we have not prescribe this for her before

## 2018-06-15 ENCOUNTER — Telehealth: Payer: Self-pay | Admitting: *Deleted

## 2018-06-15 MED ORDER — CIPROFLOXACIN HCL 250 MG PO TABS: 250.0000 mg | ORAL_TABLET | Freq: Two times a day (BID) | ORAL | 0 refills | Status: DC

## 2018-06-15 NOTE — Telephone Encounter (Signed)
Patient aware, Rx sent.  

## 2018-06-15 NOTE — Telephone Encounter (Signed)
Patient was treated with ciprofloxacin 250 mg twice daily x3 days on 06/05/18. Called having lower back discomfort and pressure, asked if another dose could be given? Please advise

## 2018-06-15 NOTE — Telephone Encounter (Signed)
Recommend ciprofloxacin 250 mg twice daily for 7 days and make sure that she completes the full 7 days.

## 2018-07-24 ENCOUNTER — Encounter: Payer: Self-pay | Admitting: Allergy

## 2019-01-06 ENCOUNTER — Encounter: Payer: Self-pay | Admitting: Gynecology

## 2019-02-08 ENCOUNTER — Encounter: Payer: Self-pay | Admitting: Gynecology

## 2019-02-08 ENCOUNTER — Other Ambulatory Visit: Payer: Self-pay

## 2019-02-08 ENCOUNTER — Ambulatory Visit (INDEPENDENT_AMBULATORY_CARE_PROVIDER_SITE_OTHER): Payer: Medicare Other | Admitting: Gynecology

## 2019-02-08 VITALS — BP 120/76

## 2019-02-08 DIAGNOSIS — R300 Dysuria: Secondary | ICD-10-CM | POA: Diagnosis not present

## 2019-02-08 DIAGNOSIS — N898 Other specified noninflammatory disorders of vagina: Secondary | ICD-10-CM | POA: Diagnosis not present

## 2019-02-08 LAB — WET PREP FOR TRICH, YEAST, CLUE

## 2019-02-08 MED ORDER — NITROFURANTOIN MONOHYD MACRO 100 MG PO CAPS: 100.0000 mg | ORAL_CAPSULE | Freq: Two times a day (BID) | ORAL | 0 refills | Status: DC

## 2019-02-08 MED ORDER — FLUCONAZOLE 150 MG PO TABS: 150.0000 mg | ORAL_TABLET | Freq: Once | ORAL | 0 refills | Status: AC

## 2019-02-08 NOTE — Progress Notes (Signed)
    [place] [birth year] [id number]        73 y.o.  [id number] presents with a 2-week history of dysuria, frequency and suprapubic discomfort.  Also with vaginal discharge and itching.  No fever or chills.  No low back pain.  Past medical history,surgical history, problem list, medications, allergies, family history and social history were all reviewed and documented in the EPIC chart.  Directed ROS with pertinent positives and negatives documented in the history of present illness/assessment and plan.  Exam: [name] assistant Vitals:   02/08/19 1129  BP: 120/76   General appearance:  Normal Spine straight without CVA tenderness Abdomen soft nontender without mass guarding rebound Pelvic external BUS vagina with atrophic changes.  Scant discharge noted.  Bimanual without masses or tenderness  Assessment/Plan:  73 y.o. [id number] with history and exam as above.  Wet prep is negative.  Urine analysis does not show significant bacteriuria or white cells.  She does state though that she feels the same way she did earlier this year when she had an E. coli UTI.  Going to cover her with Macrobid 100 mg twice daily x7 days and culture the urine.  We will also treat with Diflucan 150 mg x 1 dose to cover for yeast given the itching symptoms with discharge.  Follow-up if symptoms persist, worsen or recur.    Anastasio Auerbach MD, 11:39 AM 02/08/2019

## 2019-02-08 NOTE — Patient Instructions (Signed)
Take the Macrodantin antibiotic twice daily for 7 days.  Take the 1 Diflucan pill  Follow-up if your symptoms persist, worsen or recur

## 2019-02-12 ENCOUNTER — Telehealth: Payer: Self-pay | Admitting: *Deleted

## 2019-02-12 LAB — URINALYSIS, COMPLETE W/RFL CULTURE
Bacteria, UA: NONE SEEN /HPF
Bilirubin Urine: NEGATIVE
Glucose, UA: NEGATIVE
Hgb urine dipstick: NEGATIVE
Hyaline Cast: NONE SEEN /LPF
Ketones, ur: NEGATIVE
Leukocyte Esterase: NEGATIVE
Nitrites, Initial: NEGATIVE
Protein, ur: NEGATIVE
RBC / HPF: NONE SEEN /HPF (ref 0–2)
Specific Gravity, Urine: 1.022 (ref 1.001–1.03)
pH: 5.5 (ref 5.0–8.0)

## 2019-02-12 MED ORDER — CLINDAMYCIN PHOSPHATE 2 % VA CREA: 1.0000 | TOPICAL_CREAM | Freq: Every day | VAGINAL | 0 refills | Status: DC

## 2019-02-12 NOTE — Telephone Encounter (Signed)
Patient was treated with Macrobid 100 mg on OV 02/08/19, patient said she stopped medication due to reaction itching red bumps appeared. She called to get urine results informed negative. Patient is still c/o burning at the start of urination and vaginal burning at times. No back pain, fever, chills etc. She asked if you would recommend something to help with this?

## 2019-02-12 NOTE — Telephone Encounter (Signed)
Patient informed, Rx sent.  

## 2019-02-12 NOTE — Telephone Encounter (Signed)
I would recommend Cleocin vaginal cream nightly x7 nights.  This will cover for any local bacterial overgrowth.  If her symptoms would persist I would recommend office visit.  We may want to discuss vaginal estrogen.

## 2019-03-11 ENCOUNTER — Other Ambulatory Visit: Payer: Self-pay | Admitting: Family Medicine

## 2019-03-11 DIAGNOSIS — I499 Cardiac arrhythmia, unspecified: Secondary | ICD-10-CM

## 2019-03-11 DIAGNOSIS — I4510 Unspecified right bundle-branch block: Secondary | ICD-10-CM

## 2019-03-11 DIAGNOSIS — R002 Palpitations: Secondary | ICD-10-CM

## 2019-03-11 DIAGNOSIS — R0602 Shortness of breath: Secondary | ICD-10-CM

## 2019-03-12 ENCOUNTER — Ambulatory Visit
Admission: RE | Admit: 2019-03-12 | Discharge: 2019-03-12 | Disposition: A | Discharge status: Home / Self Care | Payer: Medicare Other | Source: Ambulatory Visit | Attending: Family Medicine | Admitting: Family Medicine

## 2019-03-12 ENCOUNTER — Other Ambulatory Visit: Payer: Self-pay | Admitting: Family Medicine

## 2019-03-12 ENCOUNTER — Other Ambulatory Visit: Payer: Self-pay

## 2019-03-12 ENCOUNTER — Encounter
Admission: RE | Admit: 2019-03-12 | Discharge: 2019-03-12 | Disposition: A | Discharge status: Home / Self Care | Payer: Medicare Other | Source: Ambulatory Visit | Attending: Family Medicine | Admitting: Family Medicine

## 2019-03-12 DIAGNOSIS — I499 Cardiac arrhythmia, unspecified: Secondary | ICD-10-CM

## 2019-03-12 DIAGNOSIS — I4510 Unspecified right bundle-branch block: Secondary | ICD-10-CM | POA: Insufficient documentation

## 2019-03-12 DIAGNOSIS — R0602 Shortness of breath: Secondary | ICD-10-CM

## 2019-03-12 DIAGNOSIS — R002 Palpitations: Secondary | ICD-10-CM | POA: Diagnosis present

## 2019-03-12 MED ORDER — TECHNETIUM TO 99M ALBUMIN AGGREGATED
3.9800 | Freq: Once | INTRAVENOUS | Status: AC | PRN
  Administered 2019-03-12: 3.98 via INTRAVENOUS

## 2019-03-22 DIAGNOSIS — C439 Malignant melanoma of skin, unspecified: Secondary | ICD-10-CM | POA: Insufficient documentation

## 2019-03-22 DIAGNOSIS — R002 Palpitations: Secondary | ICD-10-CM | POA: Insufficient documentation

## 2019-06-04 ENCOUNTER — Encounter: Payer: Medicare Other | Admitting: Obstetrics and Gynecology

## 2019-06-04 ENCOUNTER — Encounter: Payer: Medicare Other | Admitting: Gynecology

## 2019-06-24 ENCOUNTER — Encounter: Payer: Medicare Other | Admitting: Obstetrics and Gynecology

## 2019-07-16 ENCOUNTER — Other Ambulatory Visit: Payer: Self-pay

## 2019-07-19 ENCOUNTER — Other Ambulatory Visit: Payer: Self-pay | Admitting: Obstetrics and Gynecology

## 2019-07-19 ENCOUNTER — Other Ambulatory Visit: Payer: Self-pay

## 2019-07-19 ENCOUNTER — Ambulatory Visit (INDEPENDENT_AMBULATORY_CARE_PROVIDER_SITE_OTHER): Payer: Medicare Other | Admitting: Obstetrics and Gynecology

## 2019-07-19 ENCOUNTER — Encounter: Payer: Self-pay | Admitting: Obstetrics and Gynecology

## 2019-07-19 VITALS — BP 124/80 | Ht 66.0 in | Wt 207.0 lb

## 2019-07-19 DIAGNOSIS — R300 Dysuria: Secondary | ICD-10-CM

## 2019-07-19 DIAGNOSIS — N952 Postmenopausal atrophic vaginitis: Secondary | ICD-10-CM

## 2019-07-19 DIAGNOSIS — K644 Residual hemorrhoidal skin tags: Secondary | ICD-10-CM | POA: Diagnosis not present

## 2019-07-19 DIAGNOSIS — N898 Other specified noninflammatory disorders of vagina: Secondary | ICD-10-CM | POA: Diagnosis not present

## 2019-07-19 DIAGNOSIS — Z01419 Encounter for gynecological examination (general) (routine) without abnormal findings: Secondary | ICD-10-CM

## 2019-07-19 DIAGNOSIS — Z9289 Personal history of other medical treatment: Secondary | ICD-10-CM

## 2019-07-19 LAB — WET PREP FOR TRICH, YEAST, CLUE

## 2019-07-19 MED ORDER — FLUCONAZOLE 150 MG PO TABS: 150.0000 mg | ORAL_TABLET | ORAL | 0 refills | Status: AC

## 2019-07-19 MED ORDER — HYDROCORTISONE 2.5 % EX CREA: TOPICAL_CREAM | Freq: Two times a day (BID) | CUTANEOUS | 3 refills | Status: DC

## 2019-07-19 MED ORDER — NITROFURANTOIN MONOHYD MACRO 100 MG PO CAPS: 100.0000 mg | ORAL_CAPSULE | Freq: Two times a day (BID) | ORAL | 0 refills | Status: DC

## 2019-07-19 NOTE — Progress Notes (Signed)
Anna [name] 08-Dec-1945 [id number]  SUBJECTIVE:  74 y.o. [id number] female for annual routine gynecologic exam. Having abdominal pressure and also frequent diarrhea.  Vaginal irritation noted with some urinary frequency.  Tried some left over cleocin cream and found that to be helpful when applied externally.  Also requests a refill for prescription hydrocortisone for hemorrhoids.   Current Outpatient Medications  Medication Sig Dispense Refill  . ALPRAZolam (XANAX) 0.5 MG tablet TAKE 1 TABLET AT BEDTIME AS NEEDED 30 tablet 4  . azelastine (OPTIVAR) 0.05 % ophthalmic solution Place 1 drop in each eye twice a day as needed for watery, itchy, red eyes 6 mL 5  . cetirizine (ZYRTEC) 10 MG tablet Take 10 mg by mouth daily.    . clindamycin (CLEOCIN) 2 % vaginal cream Place 1 Applicatorful vaginally at bedtime. For 7 nights 40 g 0  . EPINEPHrine 0.3 mg/0.3 mL IJ SOAJ injection Use as directed for severe allergic reaction 2 Device 2  . fluticasone (FLONASE) 50 MCG/ACT nasal spray Place into both nostrils daily.    . nitrofurantoin, macrocrystal-monohydrate, (MACROBID) 100 MG capsule Take 1 capsule (100 mg total) by mouth 2 (two) times daily. 14 capsule 0   No current facility-administered medications for this visit.   Allergies: Iodinated diagnostic agents, Sulfa antibiotics, Azithromycin, Other, and Wheat bran  No LMP recorded. Patient is postmenopausal.  Past medical history,surgical history, problem list, medications, allergies, family history and social history were all reviewed and documented as reviewed in the EPIC chart.  ROS:  Feeling well. No dyspnea or chest pain on exertion.  No abdominal pain, change in bowel habits, black or bloody stools.  No urinary tract symptoms. GYN ROS:  no abnormal bleeding, pelvic pain or discharge, no breast pain or new or enlarging lumps on self exam. No neurological complaints.   OBJECTIVE:  BP 124/80   Ht 5\' 6"  (1.676 m)   Wt 207 lb (93.9 kg)    BMI 33.41 kg/m  The patient appears well, alert, oriented x 3, in no distress. ENT normal.  Neck supple. No cervical or supraclavicular adenopathy or thyromegaly.  Lungs are clear, good air entry, no wheezes, rhonchi or rales. S1 and S2 normal, no murmurs, regular rate and rhythm.  Abdomen soft without tenderness, guarding, mass or organomegaly.  Neurological is normal, no focal findings.  BREAST EXAM: breasts appear normal, no suspicious masses, no skin or nipple changes or axillary nodes  PELVIC EXAM: VULVA: normal appearing vulva with no masses, tenderness or lesions, atrophic changes, VAGINA: normal appearing vagina with normal color and discharge, no lesions, CERVIX: normal appearing cervix without discharge or lesions, UTERUS: uterus is normal size, shape, consistency and nontender, ADNEXA: normal adnexa in size, nontender and no masses, RECTAL: normal rectal, no masses, external hemorrhoids non-thrombosed, WET MOUNT done - results: pending, UA - pending  Chaperone: [name] present during the examination  ASSESSMENT:  74 y.o. [id number] here for annual gynecologic exam  PLAN:   1. Postmenopausal. No vaginal bleeding.  Not having significant hot flashes or night sweats. 2. Pap smear 05/2017.  No history of abnormal Pap smears.  Next Pap smear due 2022 following the current guidelines recommending the 3 year interval if she should so desire to continue screening. 3. Mammogram 07/2019.  Normal breast exam today.  Continue annual mammogram year when due. 4. Colonoscopy 2013.  Recommended that she follow up at the recommended interval.  Planning to have a endoscopy and colonoscopy due to stomach issues, diarrhea, and acid  reflux with [facility].  5. DEXA 2016.  Next DEXA recommended now at 5 year interval, but she would like to schedule at a later date after she has other things worked up first. 6. Vaginal irritation. 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 Just atrophic changes noted on exam.  Vaginal wet prep collected, results pending, will let patient know when results available.  If negative, we may decide to try empiric hydrocortisone cream vs Cleocin cream since that was helpful for her before. 7. Hemorrhoids.  Refill for 2.5% hydrocortisone cream is provided. 8. Urinary urgency.  Will check UA and let patient know when results are available. 9. Health maintenance.  No labs today as she normally has these completed with her primary care provider.  Recommend that she check with her PCP regarding her feeling of generalized weakness and low blood sugar on home monitoring.  Return annually or sooner, prn.  Joseph Pierini MD 07/19/19

## 2019-07-21 ENCOUNTER — Telehealth: Payer: Self-pay | Admitting: Obstetrics and Gynecology

## 2019-07-21 LAB — URINALYSIS, COMPLETE W/RFL CULTURE
Bilirubin Urine: NEGATIVE
Glucose, UA: NEGATIVE
Hgb urine dipstick: NEGATIVE
Hyaline Cast: NONE SEEN /LPF
Nitrites, Initial: POSITIVE — AB
Protein, ur: NEGATIVE
RBC / HPF: NONE SEEN /HPF (ref 0–2)
Specific Gravity, Urine: 1.025 (ref 1.001–1.03)
pH: 5 (ref 5.0–8.0)

## 2019-07-21 LAB — URINE CULTURE
MICRO NUMBER:: 10378841
SPECIMEN QUALITY:: ADEQUATE

## 2019-07-21 LAB — CULTURE INDICATED

## 2019-07-21 MED ORDER — AMOXICILLIN-POT CLAVULANATE 500-125 MG PO TABS: 1.0000 | ORAL_TABLET | Freq: Two times a day (BID) | ORAL | 0 refills | Status: AC

## 2019-07-21 NOTE — Telephone Encounter (Signed)
I called the patient to discuss the urine culture results indicated the Klebsiella isolate is resistant to nitrofurantoin which was initially prescribed, so I will instead give her Augmentin 500/125 mg twice daily for 7 days of therapy.  She will discontinue the nitrofurantoin.  She will continue to take the fluconazole and states that she is feeling much better.

## 2019-08-09 ENCOUNTER — Encounter: Payer: Self-pay | Admitting: Obstetrics and Gynecology

## 2019-08-12 NOTE — Telephone Encounter (Signed)
Patient scheduled for tomorrow May 14 with Dr Delilah Shan.

## 2019-08-13 ENCOUNTER — Ambulatory Visit (INDEPENDENT_AMBULATORY_CARE_PROVIDER_SITE_OTHER): Payer: Medicare Other | Admitting: Obstetrics and Gynecology

## 2019-08-13 ENCOUNTER — Other Ambulatory Visit: Payer: Self-pay

## 2019-08-13 ENCOUNTER — Encounter: Payer: Self-pay | Admitting: Obstetrics and Gynecology

## 2019-08-13 VITALS — BP 122/74

## 2019-08-13 DIAGNOSIS — R300 Dysuria: Secondary | ICD-10-CM

## 2019-08-13 DIAGNOSIS — N898 Other specified noninflammatory disorders of vagina: Secondary | ICD-10-CM

## 2019-08-13 DIAGNOSIS — N952 Postmenopausal atrophic vaginitis: Secondary | ICD-10-CM

## 2019-08-13 LAB — WET PREP FOR TRICH, YEAST, CLUE

## 2019-08-13 NOTE — Progress Notes (Addendum)
Anna [name] Jul 14, 1945 [id number]  SUBJECTIVE:  74 y.o. [phone] female presents for evaluation for returning dysuria symptoms. She was treated for a Klebsiella UTI the other week with Augmentin x 7 days.  She felt her urinary symptoms greatly improved and it even helped her joints feel better and her energy level improved since taking the antibiotic.  However in the past few days she has had been having more burning with urination again in addition to nocturia, saying she was up about 6 times overnight to empty her bladder.  She did take fluconazole for empiric treatment of a vaginal candidiasis while taking the antibiotics and this also helped to improve her symptoms.  She is not currently experiencing any vaginal discharge just the increased urinary frequency and burning with urination.  Current Outpatient Medications  Medication Sig Dispense Refill  . ALPRAZolam (XANAX) 0.5 MG tablet TAKE 1 TABLET AT BEDTIME AS NEEDED 30 tablet 4  . azelastine (OPTIVAR) 0.05 % ophthalmic solution Place 1 drop in each eye twice a day as needed for watery, itchy, red eyes 6 mL 5  . cetirizine (ZYRTEC) 10 MG tablet Take 10 mg by mouth daily.    Marland Kitchen EPINEPHrine 0.3 mg/0.3 mL IJ SOAJ injection Use as directed for severe allergic reaction 2 Device 2  . fluticasone (FLONASE) 50 MCG/ACT nasal spray Place into both nostrils daily.    . hydrocortisone 2.5 % cream Apply topically 2 (two) times daily. Apply to hemorrhoid area 28 g 3  . clindamycin (CLEOCIN) 2 % vaginal cream Place 1 Applicatorful vaginally at bedtime. For 7 nights (Patient not taking: Reported on 08/13/2019) 40 g 0   No current facility-administered medications for this visit.   Allergies: Iodinated diagnostic agents, Sulfa antibiotics, Azithromycin, Other, and Wheat bran  No LMP recorded. Patient is postmenopausal.  Past medical history,surgical history, problem list, medications, allergies, family history and social history were all reviewed and  documented as reviewed in the EPIC chart.  ROS:  Feeling well. No dyspnea or chest pain on exertion.  No abdominal pain, change in bowel habits, black or bloody stools.  + urinary tract symptoms (as described in HPI). GYN ROS: As described in HPI   OBJECTIVE:  BP 122/74  The patient appears well, alert, oriented x 3, in no distress. PELVIC EXAM: VULVA: normal appearing vulva with no masses, tenderness or lesions, atrophic changes, VAGINA: normal appearing vagina with normal color and discharge, no lesions, CERVIX: normal appearing cervix without discharge or lesions, UTERUS: uterus is normal size, shape, consistency and nontender, ADNEXA: normal adnexa in size, nontender and no masses, RECTAL: normal rectal, no masses, external hemorrhoids non-thrombosed, WET MOUNT done - results: negative for pathogens, normal epithelial cells, UA is unremarkable  Chaperone: [name] present during the examination  ASSESSMENT:  73 y.o. [id number] here for vaginal irritation and dysuria of uncertain etiology  PLAN:  She is reassured that there is no sign of recurrence of UTI or vaginitis from infection.  She may be experiencing a post infection vaginitis and I recommended trying a vaginal Azo probiotic suppository since she does have some of these at home and has not yet tried them yet.  If she is not experiencing any improvement with that she could trial vaginal estrogen cream.  Vaginal atrophy can lead to increased urinary frequency and atrophic vaginitis would be consistent with her symptoms as well.  She will call back if she would like to try a vaginal estrogen cream, we did discuss the risks and  theoretical risk of systemic absorption of the estrogens which can increase her risk of heart attack, stroke, DVT, PE, breast and uterine cancer.  She will let us know how her symptoms do in the next several days.   Joseph Pierini MD 08/13/19

## 2019-08-15 LAB — URINALYSIS, COMPLETE W/RFL CULTURE
Bilirubin Urine: NEGATIVE
Glucose, UA: NEGATIVE
Hgb urine dipstick: NEGATIVE
Hyaline Cast: NONE SEEN /LPF
Ketones, ur: NEGATIVE
Nitrites, Initial: NEGATIVE
Protein, ur: NEGATIVE
RBC / HPF: NONE SEEN /HPF (ref 0–2)
Specific Gravity, Urine: 1.025 (ref 1.001–1.03)
pH: 5.5 (ref 5.0–8.0)

## 2019-08-15 LAB — URINE CULTURE
MICRO NUMBER:: 10478970
SPECIMEN QUALITY:: ADEQUATE

## 2019-08-15 LAB — CULTURE INDICATED

## 2020-01-04 ENCOUNTER — Ambulatory Visit: Payer: Self-pay | Admitting: Surgery

## 2020-01-04 DIAGNOSIS — K581 Irritable bowel syndrome with constipation: Secondary | ICD-10-CM | POA: Insufficient documentation

## 2020-01-04 DIAGNOSIS — K643 Fourth degree hemorrhoids: Secondary | ICD-10-CM | POA: Insufficient documentation

## 2020-01-04 DIAGNOSIS — K648 Other hemorrhoids: Secondary | ICD-10-CM | POA: Insufficient documentation

## 2020-01-04 DIAGNOSIS — K644 Residual hemorrhoidal skin tags: Secondary | ICD-10-CM | POA: Insufficient documentation

## 2020-01-04 NOTE — H&P (Signed)
Anna [name] Appointment: 01/04/2020 10:45 AM Location: [place] [facility] Patient #: [id number] DOB: Apr 28, 1945 Undefined / Language: Anna [name] / Race: White Female  History of Present Illness Adin Hector MD; 01/04/2020 11:39 AM) The patient is a 74 year old female who presents with hemorrhoids. Note for "Hemorrhoids": ` ` ` Patient sent for surgical consultation at the request of [facility], Dr Paulita Fujita  Chief Complaint: large hemorrhoids with prolapsing tissue. ` ` The patient is a pleasant active woman the struggle with hemorrhoids since her pregnancies in the 1970s. Sounds like she had a have a thrombosed hemorrhoid lanced. She struggle with irregular bowels. She tends to have constipation, moving her bowels a couple times a week. More recently she had more episodes of bleeding and irritation. struggle with straining and difficulty with defecation.Some burning and discomfort. 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 Turned out to be benign. No other wounds irritations. She's been followed by [facility] gastrology in the past. she had a colonoscopy given her complaints of worsening bleeding. No major concerns at sent for her prolapsing rectal tissue. Concern for hemorrhoids versus true rectal prolapse. Surgical consultation offered. With her concerns irregular bowels, she was placed on IB guard and she also started taking over-the-counter magnesium supplements to recommendation of family. Now she is moving her bowels most days. Her hemorrhoids are less irritating. Less bleeding but still some burning and discomfort. She denies any incontinence to flatus or stool. She does not need to wear a pad.  She is otherwise relatively active. Can walk a half hour without difficulty. She does not smoke or diabetic. She had a tubal ligation as well as a cholecystectomy in the past. No other abdominal surgeries. No personal nor family history of GI/colon cancer, inflammatory bowel  disease, allergy such as Celiac Sprue, dietary/dairy problems, colitis, ulcers nor gastritis. No recent sick contacts/gastroenteritis. No travel outside the country. No changes in diet. No dysphagia to solids or liquids. No significant heartburn or reflux. No melena, hematemesis, coffee ground emesis. No evidence of prior gastric/peptic ulceration.  (Review of systems as stated in this history (HPI) or in the review of systems. Otherwise all other 12 point ROS are negative) ` ` ###########################################`  This patient encounter took 35 minutes today to perform the following: obtain history, perform exam, review outside records, interpret tests & imaging, counsel the patient on their diagnosis; and, document this encounter, including findings & plan in the electronic health record (EHR).   Past Surgical History Illene Regulus, CMA; 01/04/2020 10:50 AM) Breast Biopsy Left. Cataract Surgery Bilateral. Colon Polyp Removal - Colonoscopy Colon Removal - Complete Gallbladder Surgery - Laparoscopic Tonsillectomy  Diagnostic Studies History Lars Mage Spillers, CMA; 01/04/2020 10:50 AM) Colonoscopy within last year Mammogram 1-3 years ago Pap Smear 1-5 years ago  Allergies Illene Regulus, CMA; 01/04/2020 10:51 AM) Sulfa Drugs Azithromycin *CHEMICALS*  Medication History (Alisha Spillers, CMA; 01/04/2020 10:51 AM) ALPRAZolam (0.5MG  Tablet, Oral) Active. Medications Reconciled  Social History Illene Regulus, CMA; 01/04/2020 10:50 AM) Alcohol use Occasional alcohol use. Caffeine use Coffee, Tea. No drug use Tobacco use Never smoker.  Family History Illene Regulus, CMA; 01/04/2020 10:50 AM) Breast Cancer Family Members In General.  Pregnancy / Birth History Illene Regulus, Adel; 01/04/2020 10:50 AM) Age at menarche 32 years. Age of menopause 87-50 Gravida 2 Maternal age 30-25 Para 2  Other Problems Illene Regulus, CMA; 01/04/2020  10:50 AM) Anxiety Disorder Atrial Fibrillation Bladder Problems Cancer Cholelithiasis Gastroesophageal Reflux Disease Hemorrhoids Hypercholesterolemia  Review of Systems Lars Mage Spillers CMA; 01/04/2020 10:50 AM) General Present- Fatigue and Weight Gain. Not Present- Appetite Loss, Chills, Fever, Night Sweats and Weight Loss. Skin Present- Dryness. Not Present- Change in Wart/Mole, Hives, Jaundice, New Lesions, Non-Healing Wounds, Rash and Ulcer. HEENT Present- Hearing Loss. Not Present- Earache, Hoarseness, Nose Bleed, Oral Ulcers, Ringing in the Ears, Seasonal Allergies, Sinus Pain, Sore Throat, Visual Disturbances, Wears glasses/contact lenses and Yellow Eyes. Respiratory Present- Snoring. Not Present- Bloody sputum, Chronic Cough, Difficulty Breathing and Wheezing. Gastrointestinal Present- Abdominal Pain, Bloating, Bloody Stool, Constipation, Gets full quickly at meals, Hemorrhoids, Indigestion and Rectal Pain. Not Present- Change in Bowel Habits, Chronic diarrhea, Difficulty Swallowing, Excessive gas, Nausea and Vomiting. Female Genitourinary Present- Frequency, Nocturia and Urgency. Not Present- Painful Urination and Pelvic Pain. Musculoskeletal Present- Joint Pain, Joint Stiffness and Muscle Weakness. Not Present- Back Pain, Muscle Pain and Swelling of Extremities. Neurological Present- Weakness. Not Present- Decreased Memory, Fainting, Headaches, Numbness, Seizures, Tingling, Tremor and Trouble walking. Psychiatric Present- Anxiety. Not Present- Bipolar, Change in Sleep Pattern, Depression, Fearful and Frequent crying. Endocrine Not Present- Cold Intolerance, Excessive Hunger, Hair Changes, Heat Intolerance, Hot flashes and New Diabetes. Hematology Not Present- Blood Thinners, Easy Bruising, Excessive bleeding, Gland problems, HIV and Persistent Infections.  Vitals (Alisha Spillers CMA; 01/04/2020 10:50 AM) 01/04/2020 10:50 AM Weight: 205 lb Height: 68in Body Surface  Area: 2.07 m Body Mass Index: 31.17 kg/m  Pulse: 72 (Regular)  BP: 110/64(Sitting, Left Arm, Standard)        Physical Exam Adin Hector MD; 01/04/2020 11:10 AM)  General Mental Status-Alert. General Appearance-Not in acute distress, Not Sickly. Orientation-Oriented X3. Hydration-Well hydrated. Voice-Normal.  Integumentary Global Assessment Upon inspection and palpation of skin surfaces of the - Axillae: non-tender, no inflammation or ulceration, no drainage. and Distribution of scalp and body hair is normal. General Characteristics Temperature - normal warmth is noted.  Head and Neck Head-normocephalic, atraumatic with no lesions or palpable masses. Face Global Assessment - atraumatic, no absence of expression. Neck Global Assessment - no abnormal movements, no bruit auscultated on the right, no bruit auscultated on the left, no decreased range of motion, non-tender. Trachea-midline. Thyroid Gland Characteristics - non-tender.  Eye Eyeball - Left-Extraocular movements intact, No Nystagmus - Left. Eyeball - Right-Extraocular movements intact, No Nystagmus - Right. Cornea - Left-No Hazy - Left. Cornea - Right-No Hazy - Right. Sclera/Conjunctiva - Left-No scleral icterus, No Discharge - Left. Sclera/Conjunctiva - Right-No scleral icterus, No Discharge - Right. Pupil - Left-Direct reaction to light normal. Pupil - Right-Direct reaction to light normal.  ENMT Ears Pinna - Left - no drainage observed, no generalized tenderness observed. Pinna - Right - no drainage observed, no generalized tenderness observed. Nose and Sinuses External Inspection of the Nose - no destructive lesion observed. Inspection of the nares - Left - quiet respiration. Inspection of the nares - Right - quiet respiration. Mouth and Throat Lips - Upper Lip - no fissures observed, no pallor noted. Lower Lip - no fissures observed, no pallor noted. Nasopharynx -  no discharge present. Oral Cavity/Oropharynx - Tongue - no dryness observed. Oral Mucosa - no cyanosis observed. Hypopharynx - no evidence of airway distress observed.  Chest and Lung Exam Inspection Movements - Normal and Symmetrical. Accessory muscles - No use of accessory muscles in breathing. Palpation Palpation of the chest reveals - Non-tender. Auscultation Breath sounds - Normal and Clear.  Cardiovascular Auscultation Rhythm - Regular. Murmurs & Other Heart Sounds - Auscultation of the heart reveals - No  Murmurs and No Systolic Clicks.  Abdomen Inspection Inspection of the abdomen reveals - No Visible peristalsis and No Abnormal pulsations. Umbilicus - No Bleeding, No Urine drainage. Palpation/Percussion Palpation and Percussion of the abdomen reveal - Soft, Non Tender, No Rebound tenderness, No Rigidity (guarding) and No Cutaneous hyperesthesia. Note: Abdomen soft. Not severely distended. No distasis recti. No umbilical or other anterior abdominal wall hernias  Female Genitourinary Sexual Maturity Tanner 5 - Adult hair pattern. Note: No vaginal bleeding nor discharge. no inguinal lymphadenopathy nor hernias. Mild scarring on posterior introitus consistent with prior vulvar excision. No ulcer  Rectal Note: please refer to anoscopy.  Grade 4 right posterior hemorrhoid chronically prolapsed with extensive external hemorrhoidal tissue circumferentially. Right anterior and left lateral internal hemorrhoids at least grade 2. Boggy dilated rectum but no true mucosal circumferential prolapse. Intact sphincter tone without any defects. Moderate volume of stool in vault.  no fissure or fistula. No condyloma. No proctitis. No obvious bleeding. No pilonidal disease.  Peripheral Vascular Upper Extremity Inspection - Left - No Cyanotic nailbeds - Left, Not Ischemic. Inspection - Right - No Cyanotic nailbeds - Right, Not Ischemic.  Neurologic Neurologic evaluation reveals  -normal attention span and ability to concentrate, able to name objects and repeat phrases. Appropriate fund of knowledge , normal sensation and normal coordination. Mental Status Affect - not angry, not paranoid. Cranial Nerves-Normal Bilaterally. Gait-Normal.  Neuropsychiatric Mental status exam performed with findings of-able to articulate well with normal speech/language, rate, volume and coherence, thought content normal with ability to perform basic computations and apply abstract reasoning and no evidence of hallucinations, delusions, obsessions or homicidal/suicidal ideation.  Musculoskeletal Global Assessment Spine, Ribs and Pelvis - no instability, subluxation or laxity. Right Upper Extremity - no instability, subluxation or laxity.  Lymphatic Head & Neck  General Head & Neck Lymphatics: Bilateral - Description - No Localized lymphadenopathy. Axillary  General Axillary Region: Bilateral - Description - No Localized lymphadenopathy. Femoral & Inguinal  Generalized Femoral & Inguinal Lymphatics: Left - Description - No Localized lymphadenopathy. Right - Description - No Localized lymphadenopathy.   Results Adin Hector MD; 01/04/2020 11:32 AM) Procedures  Name Value Date Hemorrhoids Procedure Anal exam: External Hemorrhoid Internal exam: Internal Hemorroids ( non-bleeding) prolapse Other: Grade 4 right posterior hemorrhoid chronically prolapsed with extensive external hemorrhoidal tissue circumferentially. Right anterior and left lateral internal hemorrhoids at least grade 2. Boggy dilated rectum but no true mucosal circumferential prolapse. Intact sphincter tone without any defects. Moderate volume of stool in vault.............no fissure or fistula. No condyloma. No proctitis. No obvious bleeding. No pilonidal disease.  Performed: 01/04/2020 11:11 AM    Assessment & Plan Adin Hector MD; 01/04/2020 11:32 AM)  PROLAPSED INTERNAL  HEMORRHOIDS, GRADE 4 (K64.3) Impression: obvious prolapsed hemorrhoids with some external component. I think explains her bleeding and irritation.  Continue bowel regimen through Dr. Paulita Fujita. That seems to help. Ideally 1-2 bowel movements today.  I think she would benefit from surgery. Hemorrhoidal ligation/pexy with hemorrhoidectomy of prolapsing and external tissue. She is thinking about getting it over with in the wintertime but wants to see how she feels with the improved bowel regimen first. Had a long discussion with her the option. She appreciated the education. She will let us know if she wishes to proceed with hemorrhoid surgery or see if things are more tolerable on the bowel regimen  The anatomy & physiology of the anorectal region was discussed. The pathophysiology of hemorrhoids and differential diagnosis was discussed. Natural history progression  was discussed. I stressed the importance of a bowel regimen to have daily soft bowel movements to minimize progression of disease. Goal of one BM / day ideal. Use of wet wipes, warm baths, avoiding straining, etc were emphasized.  Educational handouts further explaining the pathology, treatment options, and bowel regimen were given as well. The patient expressed understanding.  Current Plans Pt Education - Pamphlet Given - The Hemorrhoid Book: discussed with patient and provided information. The anatomy & physiology of the anorectal region was discussed. The pathophysiology of hemorrhoids and differential diagnosis was discussed. Natural history risks without surgery was discussed. I stressed the importance of a bowel regimen to have daily soft bowel movements to minimize progression of disease. Interventions such as sclerotherapy & banding were discussed.  The patient's symptoms are not adequately controlled by medicines and other non-operative treatments. I feel the risks & problems of no surgery outweigh the operative risks; therefore,  I recommended surgery to treat the hemorrhoids by ligation, pexy, and possible resection.  Risks such as bleeding, infection, urinary difficulties, need for further treatment, heart attack, death, and other risks were discussed. I noted a good likelihood this will help address the problem. Goals of post-operative recovery were discussed as well. Possibility that this will not correct all symptoms was explained. Post-operative pain, bleeding, constipation, and other problems after surgery were discussed. We will work to minimize complications. Educational handouts further explaining the pathology, treatment options, and bowel regimen were given as well. Questions were answered. The patient expresses understanding & wishes to proceed with surgery.  Pt Education - [facility] Hemorrhoids (Sriram Febles): discussed with patient and provided information. 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  EXTERNAL HEMORRHOIDS WITH COMPLICATION (J00.9)   BLEEDING HEMORRHOID (K64.9)   PROLAPSED INTERNAL HEMORRHOIDS, GRADE 2 (K64.1)  Current Plans ANOSCOPY, DIAGNOSTIC (38182)  Adin Hector, MD, FACS, MASCRS Gastrointestinal and Minimally Invasive Surgery  [facility] [address] [place], [place] [address] [phone] Fax [phone] Main/Paging  CONTACT INFORMATION: Weekday (9AM-5PM) concerns: Call [facility] main office at [phone] Weeknight (5PM-9AM) or Weekend/Holiday concerns: Check [email or web address] for General Surgery [facility] coverage (Please, do not use SecureChat as it is not reliable communication to operating surgeons for immediate patient care)

## 2020-01-24 NOTE — Progress Notes (Addendum)
01/25/2020 10:28 AM   Anna [name] 1946/02/03 [id number]  Referring provider: Kirk Ruths, MD [place] [facility] [place],  [place] [address] Chief Complaint  Patient presents with  . Recurrent UTI    HPI: Anna [name] is a 74 y.o. female who is seen today for evaluation of frequency of micturition and lower abdominal pain.   Patient saw Venetia Maxon, [place] on 01/05/2020. She has urinary frequency, nocturia, burning and pain with urination. She felt like she did not emptying her bladder completely. She had vaginal itching. She noted a headache (3/10), bilateral lower abdominal pain (3/10) and back pain (3/10). Symptoms had been ongoing for several months. She reported seeing a GYN. She has a GI work up for abdominal pain.   Wet prep showed rare WBCs and a few bacteria. Microscopic UA showed rare bacteria otherwise negative. Urine culture was normal.   Her last upper tract imaging was in 2017 which showed no GU pathology.   In the past she reports recurrent UTI. In 2014 she had hematuria and underwent a CTU and the dye put her in the hospital. She never had a cystoscopy.    Patient has had infrequent UTIs over the past few years. She has had frequency irritating symptoms. She had adequate emptying. No hematuria. She has nocturia x 4-5 with infection. She had nocturia x 4-5 with UTI. She reports a new onset symptoms of vaginal burning and itching with UTI. Burning is more external. She was using feminine wipes. She states that she is drinking more water. PVR is 11 mL.   She has mild daytime symptoms. She is able to hold her urine. Denies toilet mapping. Patient was prescribed oxybutynin but has not taken any yet.   She recently obtained a new OB/GYN. Denies trying topical estrogen cream. Patient is not sexually active.    She has a personal history of IBS. She recently had a colonoscopy.   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 She denies  ankle edema. She reports diabetes runs in her family.   Patient does snore. Denies having a sleep study due to COVID-19.   Patient had a vulvectomy in 2019.  Pathology was benign.  Patient has lyme disease.   (+) Urine culture: 07/19/2019: Klebsiella pneumoniae 11/24/2019: Klebsiella pneumoniae    PMH: Past Medical History:  Diagnosis Date  . Acid reflux   . Anxiety   . Bilateral cataracts   . CIN I (cervical intraepithelial neoplasia I)   . Hemorrhoid   . History of basal cell carcinoma (BCC) excision   . History of gallstones   . History of melanoma excision RIGHT LEG  --  2012  . History of Wolff-Parkinson-White (WPW) syndrome    1997  s/p  ablation  . Lyme disease   . Mild carotid artery disease ([place])    per duplex 12-10-2012  bilateral ICA 1-39% stenosis  . Squamous carcinoma    face beside nose, multiple areas  . SVT (supraventricular tachycardia) ([place]) 10/31/2014    Surgical History: Past Surgical History:  Procedure Laterality Date  . BREAST SURGERY Left 1999   BREAST MASS EXCISED, benign  . CARDIAC ELECTROPHYSIOLOGY STUDY AND ABLATION  1997   SUCCESSFUL ABLATION OF WOLFE-PARKINSON-WHITE SYNDROME  (NO ISSUES SINCE)  . CATARACT EXTRACTION, BILATERAL    . CERVICAL BIOPSY  W/ LOOP ELECTRODE EXCISION  2005   dr Cherylann Banas  . COLONOSCOPY    . COLPOSCOPY    . HYSTEROSCOPY WITH  D & C  02/13/2012   Procedure: DILATATION AND CURETTAGE /HYSTEROSCOPY;  Surgeon: Bennetta Laos, MD;  Location: [facility];  Service: Gynecology;  Laterality: N/A;  deficit - 710  . LAPAROSCOPIC CHOLECYSTECTOMY  05-16-2005  dr [facility]  . melanoma excised    . SVT-CARDIAC ABLATION    . TRANSTHORACIC ECHOCARDIOGRAM  12-10-2012   dr Wynonia Lawman   mild LVH, ef 55-60%/  mild MR/  mild LAE and RAE  . TUBAL LIGATION    . UPPER GI ENDOSCOPY    . VULVECTOMY N/A 07/25/2017   Procedure: WIDE EXCISION VULVECTOMY;  Surgeon: Marti Sleigh, MD;  Location: [facility];  Service: Gynecology;  Laterality: N/A;    Home Medications:  Allergies as of 01/25/2020      Reactions   Iodinated Diagnostic Agents Hypertension   Patient experienced elevated heart rate, hypertension and had to be taken to the ED   Gluten Meal    Shellfish Allergy Hives   Sulfa Antibiotics Hives, Nausea Only   Azithromycin Diarrhea, Palpitations, Other (See Comments)   Reaction:  Cramping    Other Rash   Alpha gal Red meat Stomach pain   Wheat Bran Rash   Stomach pain      Medication List       Accurate as of January 25, 2020 11:59 PM. If you have any questions, ask your nurse or doctor.        STOP taking these medications   oxybutynin 5 MG 24 hr tablet Commonly known as: DITROPAN-XL Stopped by: Hollice Espy, MD     TAKE these medications   ALPRAZolam 0.5 MG tablet Commonly known as: XANAX TAKE 1 TABLET AT BEDTIME AS NEEDED   azelastine 0.05 % ophthalmic solution Commonly known as: OPTIVAR Place 1 drop in each eye twice a day as needed for watery, itchy, red eyes   cetirizine 10 MG tablet Commonly known as: ZYRTEC Take 10 mg by mouth daily.   clindamycin 2 % vaginal cream Commonly known as: CLEOCIN Place 1 Applicatorful vaginally at bedtime. For 7 nights   [employer] Gentle Laxative 5 MG EC tablet Generic drug: bisacodyl Take by mouth.   EPINEPHrine 0.3 mg/0.3 mL Soaj injection Commonly known as: EPI-PEN Use as directed for severe allergic reaction   estradiol 0.1 MG/GM vaginal cream Commonly known as: ESTRACE Compounded Item: Estrogen Cream Instruction  Discard applicator  Apply pea sized amount to tip of finger to urethra before bed. Wash hands well after application. Use Monday, Wednesday and Friday Started by: Hollice Espy, MD   fluticasone 50 MCG/ACT nasal spray Commonly known as: FLONASE Place into both nostrils daily.   hydrocortisone 2.5 % cream Apply topically 2 (two) times daily. Apply to hemorrhoid area   peppermint oil  liquid Take by mouth.   Premarin vaginal cream Generic drug: conjugated estrogens Place 1 Applicatorful vaginally daily. Use pea sized amount M-W-Fr before bedtime Started by: Hollice Espy, MD       Allergies:  Allergies  Allergen Reactions  . Iodinated Diagnostic Agents Hypertension    Patient experienced elevated heart rate, hypertension and had to be taken to the ED  . Gluten Meal   . Shellfish Allergy Hives  . Sulfa Antibiotics Hives and Nausea Only  . Azithromycin Diarrhea, Palpitations and Other (See Comments)    Reaction:  Cramping   . Other Rash    Alpha gal Red meat Stomach pain  . Wheat Bran Rash    Stomach pain  Family History: Family History  Problem Relation Age of Onset  . Cancer Mother        ESOPHAGEAL CANCER  . Diabetes Father   . Hyperlipidemia Father   . Heart disease Father   . Diabetes Brother   . Breast cancer Paternal Aunt        Age 58's  . Hyperlipidemia Paternal Grandmother   . Heart disease Paternal Grandmother   . Diabetes Paternal Grandfather   . Hyperlipidemia Paternal Grandfather   . Heart disease Paternal Grandfather     Social History:  reports that she has never smoked. She has never used smokeless tobacco. She reports current alcohol use. She reports that she does not use drugs.   Physical Exam: BP (!) 167/90   Pulse 80   Constitutional:  Alert and oriented, No acute distress. HEENT: [place] AT, moist mucus membranes.  Trachea midline, no masses. Cardiovascular: No clubbing, cyanosis, or edema. Respiratory: Normal respiratory effort, no increased work of breathing. Skin: No rashes, bruises or suspicious lesions. Neurologic: Grossly intact, no focal deficits, moving all 4 extremities. Psychiatric: Normal mood and affect.  Laboratory Data:  Urinalysis Results for orders placed or performed in visit on 01/25/20  Microscopic Examination   Urine  Result Value Ref Range   WBC, UA 0-5 0 - 5 /hpf   RBC None seen 0 - 2 /hpf    Epithelial Cells (non renal) 0-10 0 - 10 /hpf   Renal Epithel, UA 0-10 (A) None seen /hpf   Bacteria, UA None seen None seen/Few  Urinalysis, Complete  Result Value Ref Range   Specific Gravity, UA 1.020 1.005 - 1.030   pH, UA 5.5 5.0 - 7.5   Color, UA Yellow Yellow   Appearance Ur Clear Clear   Leukocytes,UA Negative Negative   Protein,UA Negative Negative/Trace   Glucose, UA Negative Negative   Ketones, UA Negative Negative   RBC, UA Negative Negative   Bilirubin, UA Negative Negative   Urobilinogen, Ur 0.2 0.2 - 1.0 mg/dL   Nitrite, UA Negative Negative   Microscopic Examination See below:   BLADDER SCAN AMB NON-IMAGING  Result Value Ref Range   Scan Result 11 ML       Assessment & Plan:    1. Recurrent UTI Patient has had 2 true infections this year.   Counseled pt on prevention techniques including probiotics, cranberry tablets and estrogen cream (pea sized amount) 3x a week on urethra tube at night time    Rx of Premarin given; reassured pt that it's safe to use estrogen cream given very low systemic absorption   Advised to return to our office specifically if she has any UTI type symptoms  2. Dysuria  Based on the nature of her symptoms this is the primarily external in origin and suspect atrophic vaginosis. Use topical estrogen cream (pea sized amount) 3x a week on urethra tube at night time  Will consider a pelvic exam and cystoscopy if symptoms worsen.   3. Nocturia UA unremarkable. Advise patient to follow up with PCP to discuss sleep apnea study as this could be an underlying symptom.    Follow up in 3 month for symptoms recheck.   [place] [address], [place], [place] [address] [phone]  I, Selena Batten, am acting as a scribe for Dr. Hollice Espy.  I have reviewed the above documentation for accuracy and completeness, and I agree with the above.   Hollice Espy, MD   The following  AVS  instructions were provided to the patient:  1.  Begin topical estrogen cream.  You are provided with samples today.  Like you to apply a pea-sized amount per urethral meatus (urine tube) spreading backwards towards your vagina every day for 2 weeks.  After this, you should use this 3 times a week typically Monday Wednesday Friday.  2.  Consider starting cranberry tablets twice daily for UTI prevention.  3.  Continue daily probiotic  4.  Please follow-up with your primary care to schedule a sleep study.  I believe this is contributing significantly to nighttime urinary symptoms.  I would hold off on taking the oxybutynin for now and we may consider this in the future if your symptoms fail to improve.

## 2020-01-25 ENCOUNTER — Encounter: Payer: Self-pay | Admitting: Urology

## 2020-01-25 ENCOUNTER — Ambulatory Visit (INDEPENDENT_AMBULATORY_CARE_PROVIDER_SITE_OTHER): Payer: Medicare Other | Admitting: Urology

## 2020-01-25 ENCOUNTER — Other Ambulatory Visit: Payer: Self-pay

## 2020-01-25 VITALS — BP 167/90 | HR 80

## 2020-01-25 DIAGNOSIS — N390 Urinary tract infection, site not specified: Secondary | ICD-10-CM

## 2020-01-25 DIAGNOSIS — R351 Nocturia: Secondary | ICD-10-CM | POA: Diagnosis not present

## 2020-01-25 LAB — URINALYSIS, COMPLETE
Bilirubin, UA: NEGATIVE
Glucose, UA: NEGATIVE
Ketones, UA: NEGATIVE
Leukocytes,UA: NEGATIVE
Nitrite, UA: NEGATIVE
Protein,UA: NEGATIVE
RBC, UA: NEGATIVE
Specific Gravity, UA: 1.02 (ref 1.005–1.030)
Urobilinogen, Ur: 0.2 mg/dL (ref 0.2–1.0)
pH, UA: 5.5 (ref 5.0–7.5)

## 2020-01-25 LAB — MICROSCOPIC EXAMINATION
Bacteria, UA: NONE SEEN
RBC, Urine: NONE SEEN /hpf (ref 0–2)

## 2020-01-25 LAB — BLADDER SCAN AMB NON-IMAGING: Scan Result: 11

## 2020-01-25 MED ORDER — PREMARIN 0.625 MG/GM VA CREA: 1.0000 | TOPICAL_CREAM | Freq: Every day | VAGINAL | 12 refills | Status: DC

## 2020-01-25 NOTE — Patient Instructions (Signed)
We discussed the following today:  1.  Begin topical estrogen cream.  You are provided with samples today.  Like you to apply a pea-sized amount per urethral meatus (urine tube) spreading backwards towards your vagina every day for 2 weeks.  After this, you should use this 3 times a week typically Monday Wednesday Friday.  2.  Consider starting cranberry tablets twice daily for UTI prevention.  3.  Continue daily probiotic  4.  Please follow-up with your primary care to schedule a sleep study.  I believe this is contributing significantly to nighttime urinary symptoms.  I would hold off on taking the oxybutynin for now and we may consider this in the future if your symptoms fail to improve.

## 2020-01-26 MED ORDER — ESTRADIOL 0.1 MG/GM VA CREA: TOPICAL_CREAM | VAGINAL | 12 refills | Status: DC

## 2020-04-23 IMAGING — CR DG CHEST 2V
2 series · 2 of 2 positions shown · non-contrast
Comparison: October 31, 2014

CLINICAL DATA: Shortness of breath

EXAM:
CHEST - 2 VIEW

[chest pa]
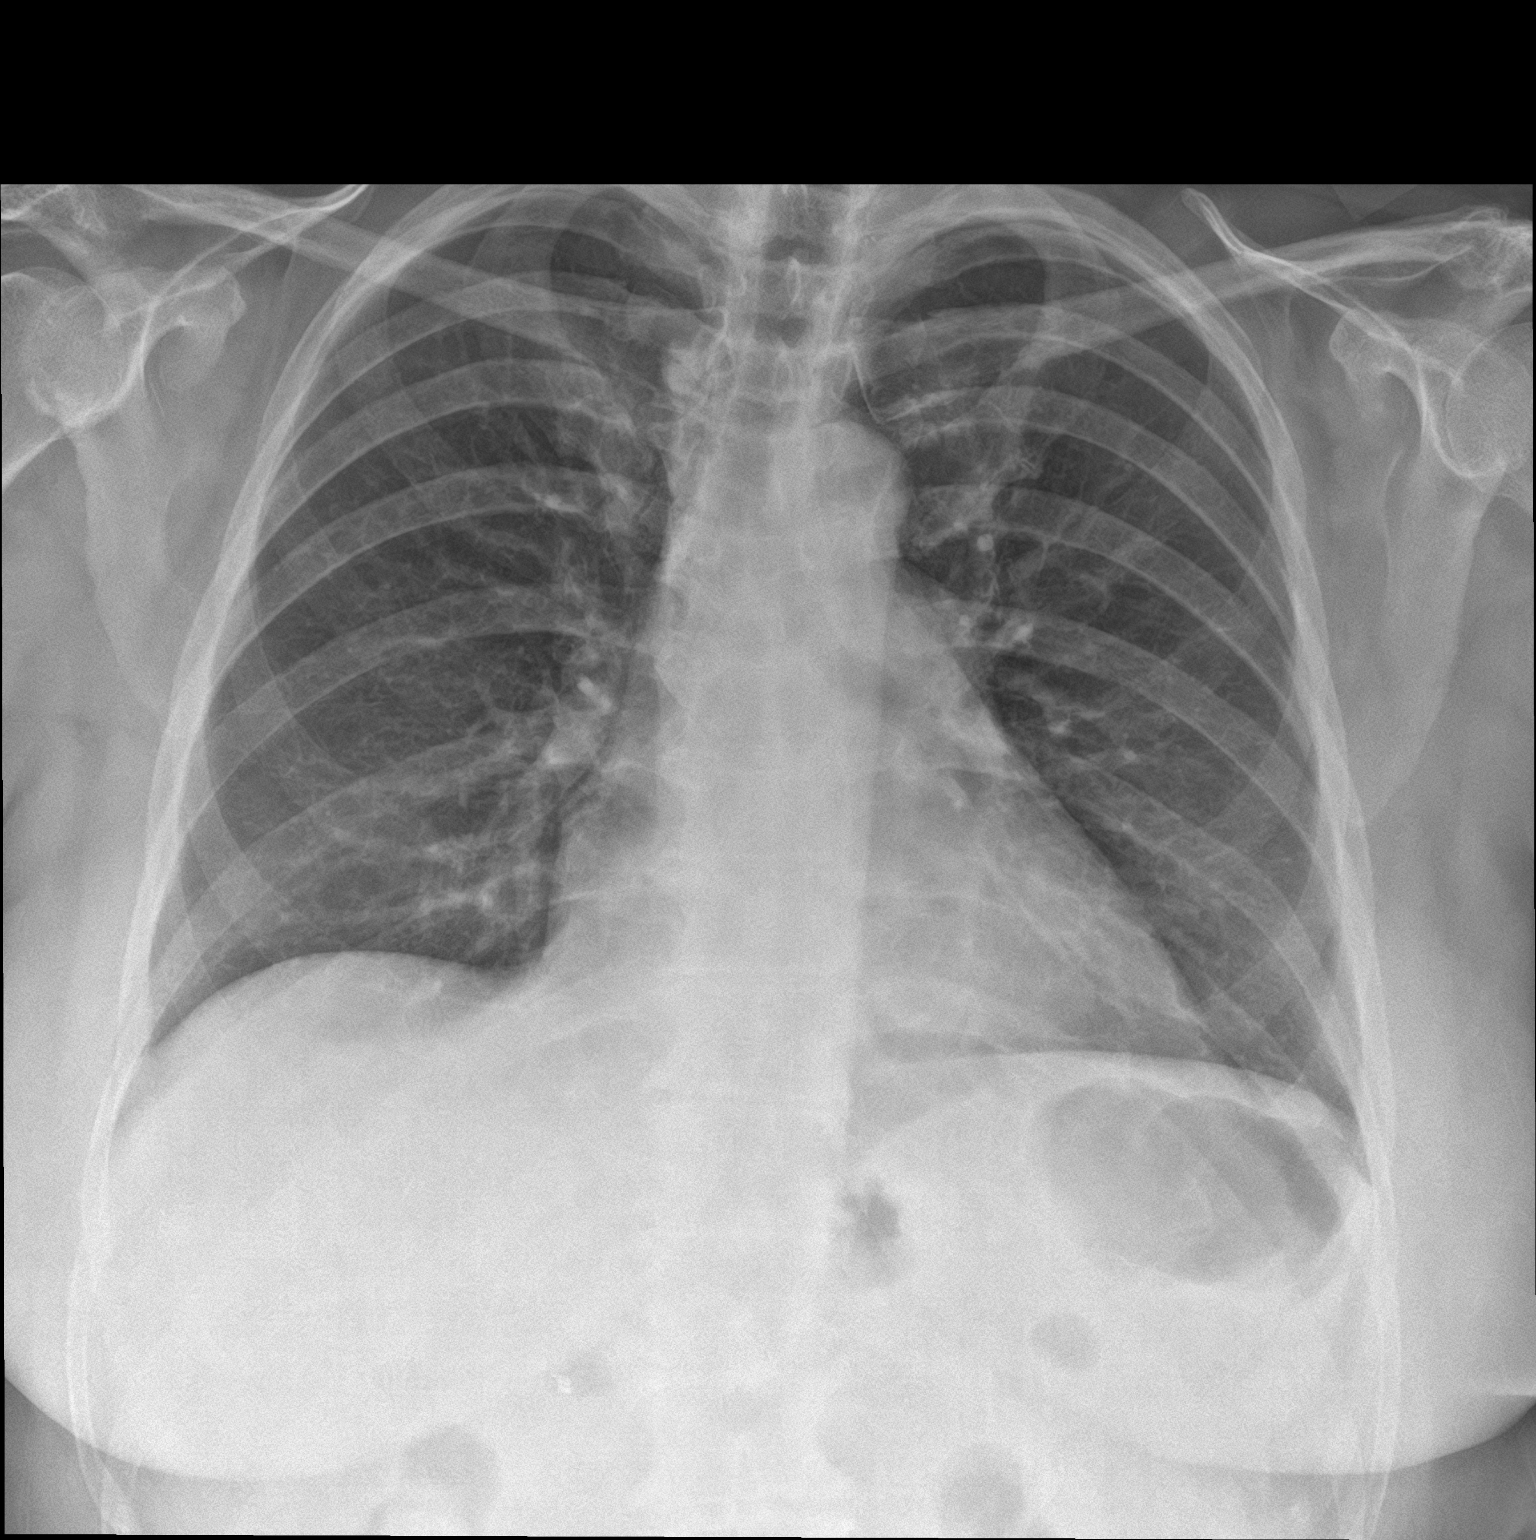

[chest lat]
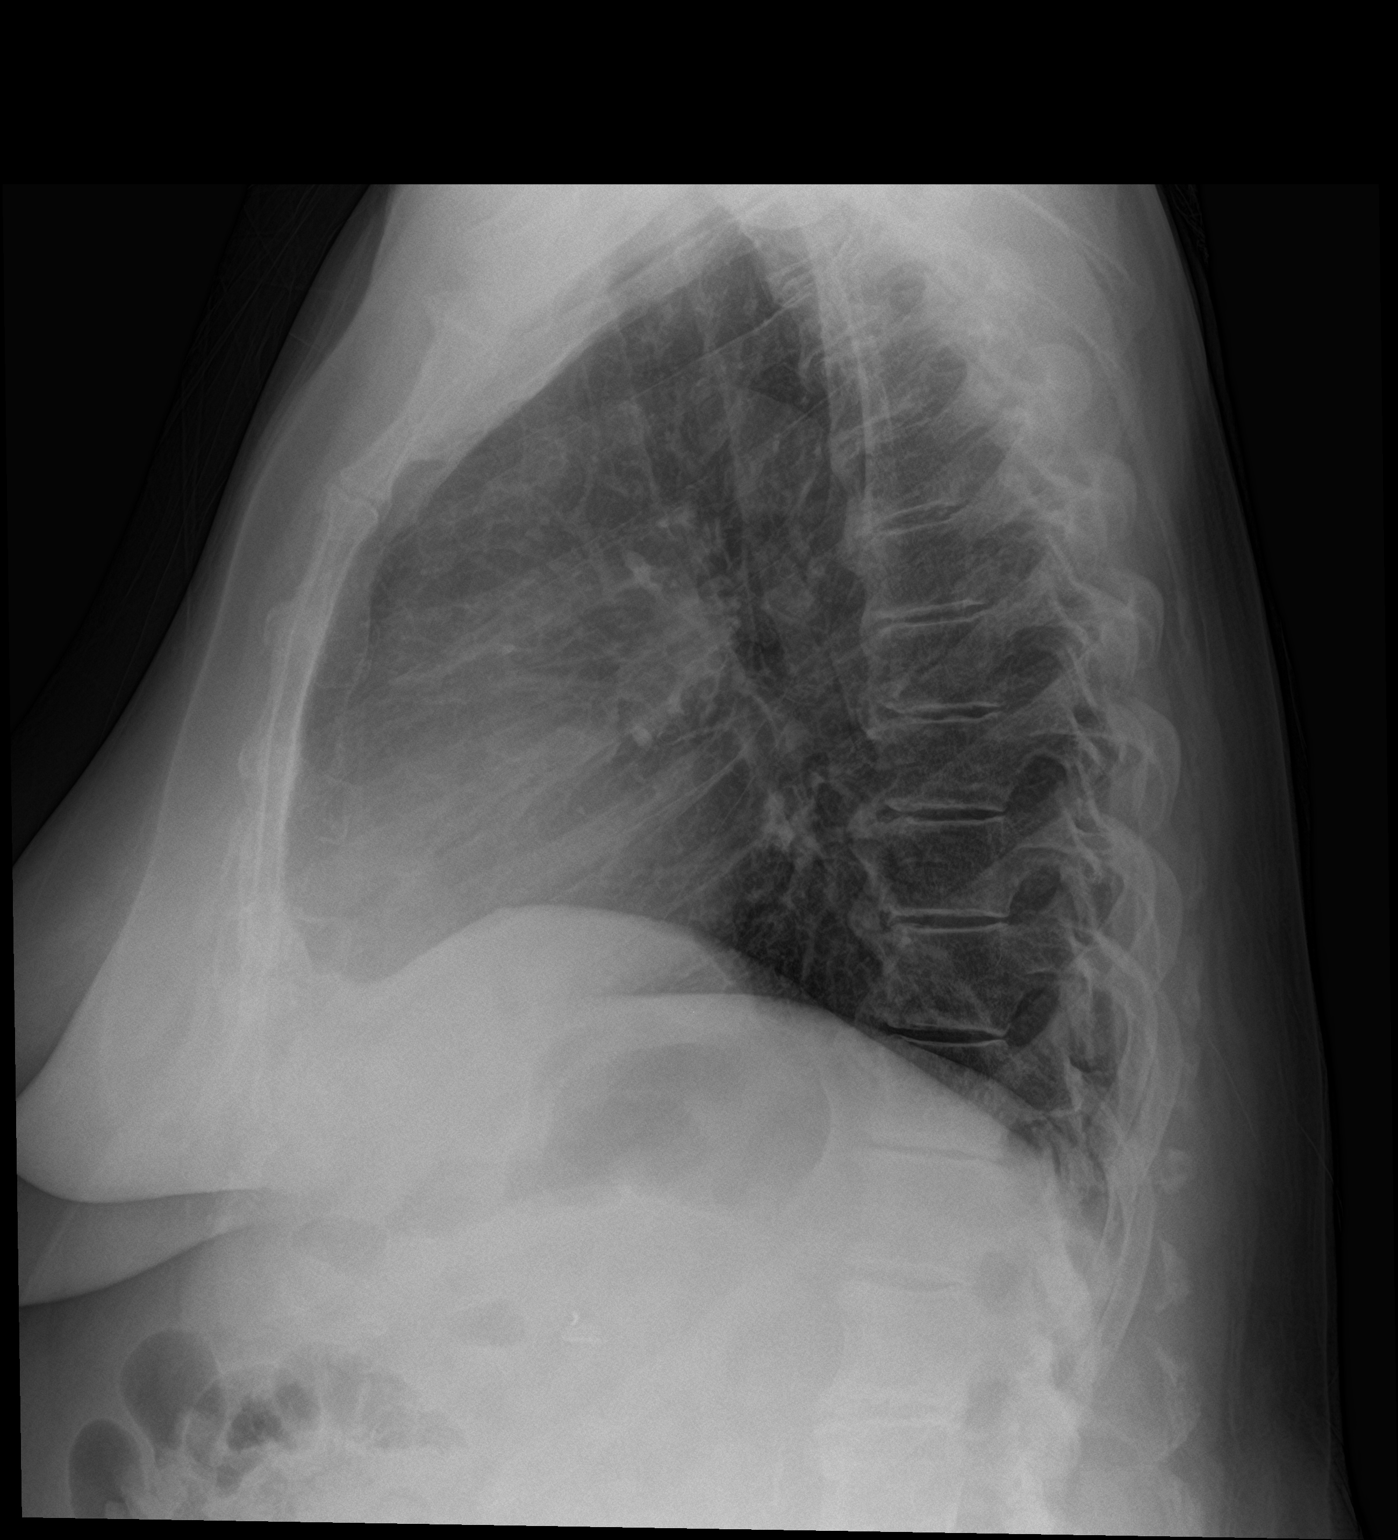

[2 of 2 positions shown; findings below may reference images not displayed]

FINDINGS: The heart size and mediastinal contours are within normal limits.
Both lungs are clear. The visualized skeletal structures are
unremarkable.
IMPRESSION: No active cardiopulmonary disease.

## 2020-05-02 ENCOUNTER — Ambulatory Visit (INDEPENDENT_AMBULATORY_CARE_PROVIDER_SITE_OTHER): Payer: Medicare Other | Admitting: Urology

## 2020-05-02 ENCOUNTER — Other Ambulatory Visit: Payer: Self-pay

## 2020-05-02 VITALS — BP 143/82 | HR 94 | Ht 68.0 in | Wt 211.0 lb

## 2020-05-02 DIAGNOSIS — R3915 Urgency of urination: Secondary | ICD-10-CM

## 2020-05-02 DIAGNOSIS — N952 Postmenopausal atrophic vaginitis: Secondary | ICD-10-CM

## 2020-05-02 DIAGNOSIS — N390 Urinary tract infection, site not specified: Secondary | ICD-10-CM

## 2020-05-02 NOTE — Progress Notes (Signed)
05/02/2020 2:26 PM   Anna [name] Nov 07, 1945 [id number]  Referring provider: Kirk Ruths, MD [place] [facility] [place],  [place] [address]  Chief Complaint  Patient presents with  . Recurrent UTI    HPI: 75 year old female with recurrent UTIs/atrophic vaginitis who presents today for routine follow-up.  She had extensive evaluation 12/2019, please see notes for details.  She reports today that she has been using the estrogen cream as directed.  She reports that initially with the samples provided, she had a really good response to the Premarin.  This is expensive and she ended up getting compounded formulation.  She feels like this is not as strong and not as helpful.  Overall though she has had improvement in her external vaginal irritation.  She continues to have some urinary urgency frequency without significant incontinence.  When she does urinate, she has burning was primarily external.  Her last pelvic exam was for 5 months ago by gynecology.  She declined repeat pelvic exam today.  Since last visit, it does not appear that she has had any urinary tract infection.  She feels like a lot of this is allergy related.  She reports that when she is following a more strict diet, watching what she eats, her weight is less and she is following an alpha gal friendly diet, her urinary symptoms also improved dramatically.  She is scheduled to see an allergist in the near future and is excited about this.   PMH: Past Medical History:  Diagnosis Date  . Acid reflux   . Anxiety   . Bilateral cataracts   . CIN I (cervical intraepithelial neoplasia I)   . Hemorrhoid   . History of basal cell carcinoma (BCC) excision   . History of gallstones   . History of melanoma excision RIGHT LEG  --  2012  . History of Wolff-Parkinson-White (WPW) syndrome    1997  s/p  ablation  . Lyme disease   . Mild carotid artery disease ([place])    per duplex  12-10-2012  bilateral ICA 1-39% stenosis  . Squamous carcinoma    face beside nose, multiple areas  . SVT (supraventricular tachycardia) ([place]) 10/31/2014    Surgical History: Past Surgical History:  Procedure Laterality Date  . BREAST SURGERY Left 1999   BREAST MASS EXCISED, benign  . CARDIAC ELECTROPHYSIOLOGY STUDY AND ABLATION  1997   SUCCESSFUL ABLATION OF WOLFE-PARKINSON-WHITE SYNDROME  (NO ISSUES SINCE)  . CATARACT EXTRACTION, BILATERAL    . CERVICAL BIOPSY  W/ LOOP ELECTRODE EXCISION  2005   dr Cherylann Banas  . COLONOSCOPY    . COLPOSCOPY    . HYSTEROSCOPY WITH D & C  02/13/2012   Procedure: DILATATION AND CURETTAGE /HYSTEROSCOPY;  Surgeon: Bennetta Laos, MD;  Location: [facility];  Service: Gynecology;  Laterality: N/A;  deficit - 710  . LAPAROSCOPIC CHOLECYSTECTOMY  05-16-2005  dr [facility]  . melanoma excised    . SVT-CARDIAC ABLATION    . TRANSTHORACIC ECHOCARDIOGRAM  12-10-2012   dr Wynonia Lawman   mild LVH, ef 55-60%/  mild MR/  mild LAE and RAE  . TUBAL LIGATION    . UPPER GI ENDOSCOPY    . VULVECTOMY N/A 07/25/2017   Procedure: WIDE EXCISION VULVECTOMY;  Surgeon: Marti Sleigh, MD;  Location: [facility];  Service: Gynecology;  Laterality: N/A;    Home Medications:  Allergies as of 05/02/2020      Reactions   Iodinated  Diagnostic Agents Hypertension   Patient experienced elevated heart rate, hypertension and had to be taken to the ED   Gluten Meal    Shellfish Allergy Hives   Sulfa Antibiotics Hives, Nausea Only   Azithromycin Diarrhea, Palpitations, Other (See Comments)   Reaction:  Cramping    Other Rash   Alpha gal Red meat Stomach pain   Wheat Bran Rash   Stomach pain      Medication List       Accurate as of May 02, 2020  2:26 PM. If you have any questions, ask your nurse or doctor.        STOP taking these medications   clindamycin 2 % vaginal cream Commonly known as: CLEOCIN Stopped by: Hollice Espy, MD   Premarin vaginal cream Generic drug: conjugated estrogens Stopped by: Hollice Espy, MD     TAKE these medications   ALPRAZolam 0.5 MG tablet Commonly known as: XANAX TAKE 1 TABLET AT BEDTIME AS NEEDED   azelastine 0.05 % ophthalmic solution Commonly known as: OPTIVAR Place 1 drop in each eye twice a day as needed for watery, itchy, red eyes   cetirizine 10 MG tablet Commonly known as: ZYRTEC Take 10 mg by mouth daily.   [employer]S Gentle Laxative 5 MG EC tablet Generic drug: bisacodyl Take by mouth.   EPINEPHrine 0.3 mg/0.3 mL Soaj injection Commonly known as: EPI-PEN Use as directed for severe allergic reaction   estradiol 0.1 MG/GM vaginal cream Commonly known as: ESTRACE Compounded Item: Estrogen Cream Instruction  Discard applicator  Apply pea sized amount to tip of finger to urethra before bed. Wash hands well after application. Use Monday, Wednesday and Friday   fluticasone 50 MCG/ACT nasal spray Commonly known as: FLONASE Place into both nostrils daily.   hydrocortisone 2.5 % cream Apply topically 2 (two) times daily. Apply to hemorrhoid area   peppermint oil liquid Take by mouth.       Allergies:  Allergies  Allergen Reactions  . Iodinated Diagnostic Agents Hypertension    Patient experienced elevated heart rate, hypertension and had to be taken to the ED  . Gluten Meal   . Shellfish Allergy Hives  . Sulfa Antibiotics Hives and Nausea Only  . Azithromycin Diarrhea, Palpitations and Other (See Comments)    Reaction:  Cramping   . Other Rash    Alpha gal Red meat Stomach pain  . Wheat Bran Rash    Stomach pain    Family History: Family History  Problem Relation Age of Onset  . Cancer Mother        ESOPHAGEAL CANCER  . Diabetes Father   . Hyperlipidemia Father   . Heart disease Father   . Diabetes Brother   . Breast cancer Paternal Aunt        Age 70's  . Hyperlipidemia Paternal Grandmother   . Heart disease Paternal  Grandmother   . Diabetes Paternal Grandfather   . Hyperlipidemia Paternal Grandfather   . Heart disease Paternal Grandfather     Social History:  reports that she has never smoked. She has never used smokeless tobacco. She reports current alcohol use. She reports that she does not use drugs.   Physical Exam: BP (!) 143/82   Pulse 94   Ht 5\' 8"  (1.727 m)   Wt 211 lb (95.7 kg)   BMI 32.08 kg/m   Constitutional:  Alert and oriented, No acute distress. HEENT: [place] AT, moist mucus membranes.  Trachea midline, no masses. Cardiovascular: No clubbing,  cyanosis, or edema. Respiratory: Normal respiratory effort, no increased work of breathing. Neurologic: Grossly intact, no focal deficits, moving all 4 extremities. Psychiatric: Normal mood and affect.   Urinalysis UA today with 1+ ketones, otherwise unremarkable.  No microscopic blood.   Assessment & Plan:    1. Atrophic vaginitis Improvement in external burning without complete resolution  She would like to check her insurance again another 2002 and see if they will cover Estrace or Premarin as this is her preference.  We will call her in this prescription as needed.  Offered pelvic exam but she has had this fairly recently thus declined.  Based on her symptoms again today, it seems like her irritative urinary symptoms are mostly due from exterior burning.  She may also have a component of bladder sensitivity and I did supply her with a IC diet information packet today.  Urine today was reassuring, will send urine cytology for reassurance but at this time, there is no indication for cystoscopy in the absence of microscopic hematuria and improving urinary symptoms with treatment of atrophic vaginitis.  She was advised to return sooner if her symptoms fail to continue to improve or if they worsen.  Low threshold to consider cystoscopy.  She is agreeable this plan.  2. Urinary urgency Discussed behavioral modification as outlined above.   Declined OAB meds.  3. Recurrent UTI Suspect many of her "UTIs" are related to #1, no evidence of true infection today - Urinalysis, Complete - Cytology - Non PAP;   Return in about 1 year (around 05/02/2021).  Hollice Espy, MD  I spent 30 total minutes on the day of the encounter including pre-visit review of the medical record, face-to-face time with the patient, and post visit ordering of labs/imaging/tests.  [place] [address] [place], [place] [address] [phone]

## 2020-05-02 NOTE — Patient Instructions (Signed)
Premarin vs Estrace

## 2020-05-03 LAB — MICROSCOPIC EXAMINATION: RBC, Urine: NONE SEEN /hpf (ref 0–2)

## 2020-05-03 LAB — URINALYSIS, COMPLETE
Bilirubin, UA: NEGATIVE
Glucose, UA: NEGATIVE
Leukocytes,UA: NEGATIVE
Nitrite, UA: NEGATIVE
Protein,UA: NEGATIVE
RBC, UA: NEGATIVE
Specific Gravity, UA: >1.03 — ABNORMAL HIGH (ref 1.005–1.030)
Urobilinogen, Ur: 0.2 mg/dL (ref 0.2–1.0)
pH, UA: 5 (ref 5.0–7.5)

## 2020-05-04 LAB — CYTOLOGY - NON PAP

## 2020-06-02 ENCOUNTER — Other Ambulatory Visit: Payer: Self-pay

## 2020-06-02 ENCOUNTER — Encounter: Payer: Self-pay | Admitting: Emergency Medicine

## 2020-06-02 ENCOUNTER — Emergency Department: Payer: No Typology Code available for payment source

## 2020-06-02 ENCOUNTER — Emergency Department
Admission: EM | Admit: 2020-06-02 | Discharge: 2020-06-02 | Disposition: A | Discharge status: Home / Self Care | Payer: No Typology Code available for payment source | Attending: Emergency Medicine | Admitting: Emergency Medicine

## 2020-06-02 DIAGNOSIS — M545 Low back pain, unspecified: Secondary | ICD-10-CM | POA: Diagnosis not present

## 2020-06-02 DIAGNOSIS — Z85820 Personal history of malignant melanoma of skin: Secondary | ICD-10-CM | POA: Diagnosis not present

## 2020-06-02 DIAGNOSIS — Z85828 Personal history of other malignant neoplasm of skin: Secondary | ICD-10-CM | POA: Diagnosis not present

## 2020-06-02 DIAGNOSIS — R0789 Other chest pain: Secondary | ICD-10-CM | POA: Diagnosis present

## 2020-06-02 DIAGNOSIS — R202 Paresthesia of skin: Secondary | ICD-10-CM | POA: Insufficient documentation

## 2020-06-02 DIAGNOSIS — Y92410 Unspecified street and highway as the place of occurrence of the external cause: Secondary | ICD-10-CM | POA: Diagnosis not present

## 2020-06-02 DIAGNOSIS — I456 Pre-excitation syndrome: Secondary | ICD-10-CM | POA: Diagnosis not present

## 2020-06-02 DIAGNOSIS — R519 Headache, unspecified: Secondary | ICD-10-CM | POA: Insufficient documentation

## 2020-06-02 DIAGNOSIS — R079 Chest pain, unspecified: Secondary | ICD-10-CM

## 2020-06-02 LAB — CBC WITH DIFFERENTIAL/PLATELET
Abs Immature Granulocytes: 0.03 10*3/uL (ref 0.00–0.07)
Basophils Absolute: 0.1 10*3/uL (ref 0.0–0.1)
Basophils Relative: 1 %
Eosinophils Absolute: 0.1 10*3/uL (ref 0.0–0.5)
Eosinophils Relative: 1 %
HCT: 40.5 % (ref 36.0–46.0)
Hemoglobin: 13.2 g/dL (ref 12.0–15.0)
Immature Granulocytes: 0 %
Lymphocytes Relative: 32 %
Lymphs Abs: 3 10*3/uL (ref 0.7–4.0)
MCH: 30.9 pg (ref 26.0–34.0)
MCHC: 32.6 g/dL (ref 30.0–36.0)
MCV: 94.8 fL (ref 80.0–100.0)
Monocytes Absolute: 0.5 10*3/uL (ref 0.1–1.0)
Monocytes Relative: 6 %
Neutro Abs: 5.5 10*3/uL (ref 1.7–7.7)
Neutrophils Relative %: 60 %
Platelets: 349 10*3/uL (ref 150–400)
RBC: 4.27 MIL/uL (ref 3.87–5.11)
RDW: 13.3 % (ref 11.5–15.5)
WBC: 9.3 10*3/uL (ref 4.0–10.5)
nRBC: 0 % (ref 0.0–0.2)

## 2020-06-02 LAB — COMPREHENSIVE METABOLIC PANEL
ALT: 17 U/L (ref 0–44)
AST: 18 U/L (ref 15–41)
Albumin: 4 g/dL (ref 3.5–5.0)
Alkaline Phosphatase: 79 U/L (ref 38–126)
Anion gap: 10 (ref 5–15)
BUN: 17 mg/dL (ref 8–23)
CO2: 23 mmol/L (ref 22–32)
Calcium: 9.2 mg/dL (ref 8.9–10.3)
Chloride: 107 mmol/L (ref 98–111)
Creatinine, Ser: 0.89 mg/dL (ref 0.44–1.00)
GFR, Estimated: >60 mL/min (ref >60)
Glucose, Bld: 105 mg/dL — ABNORMAL HIGH (ref 70–99)
Potassium: 3.7 mmol/L (ref 3.5–5.1)
Sodium: 140 mmol/L (ref 135–145)
Total Bilirubin: 0.6 mg/dL (ref 0.3–1.2)
Total Protein: 7.8 g/dL (ref 6.5–8.1)

## 2020-06-02 LAB — TROPONIN I (HIGH SENSITIVITY): Troponin I (High Sensitivity): 6 ng/L (ref <18)
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
## 2020-06-02 NOTE — ED Notes (Signed)
Told by Clydene Laming, PA to change pt acuity level to 3 and place a c collar on pt.
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
## 2020-06-19 ENCOUNTER — Other Ambulatory Visit: Payer: Self-pay

## 2020-06-19 ENCOUNTER — Ambulatory Visit (INDEPENDENT_AMBULATORY_CARE_PROVIDER_SITE_OTHER): Payer: Medicare Other | Admitting: Internal Medicine

## 2020-06-19 ENCOUNTER — Encounter: Payer: Self-pay | Admitting: Internal Medicine

## 2020-06-19 VITALS — BP 150/92 | HR 84 | Temp 98.0°F | Resp 16 | Ht 68.0 in | Wt 211.2 lb

## 2020-06-19 DIAGNOSIS — M1990 Unspecified osteoarthritis, unspecified site: Secondary | ICD-10-CM

## 2020-06-19 DIAGNOSIS — K909 Intestinal malabsorption, unspecified: Secondary | ICD-10-CM | POA: Diagnosis not present

## 2020-06-19 DIAGNOSIS — E559 Vitamin D deficiency, unspecified: Secondary | ICD-10-CM | POA: Diagnosis not present

## 2020-06-19 DIAGNOSIS — R5382 Chronic fatigue, unspecified: Secondary | ICD-10-CM

## 2020-06-19 DIAGNOSIS — Z91018 Allergy to other foods: Secondary | ICD-10-CM | POA: Diagnosis not present

## 2020-06-19 DIAGNOSIS — D649 Anemia, unspecified: Secondary | ICD-10-CM

## 2020-06-19 DIAGNOSIS — F409 Phobic anxiety disorder, unspecified: Secondary | ICD-10-CM

## 2020-06-19 DIAGNOSIS — F5105 Insomnia due to other mental disorder: Secondary | ICD-10-CM

## 2020-06-19 MED ORDER — ALPRAZOLAM 0.5 MG PO TABS: ORAL_TABLET | ORAL | 4 refills | Status: DC

## 2020-06-19 NOTE — Progress Notes (Signed)
Subjective:  Patient ID: Anna [name], female    DOB: 05/18/1945  Age: 75 y.o. MRN: [id number]  CC: The primary encounter diagnosis was Chronic fatigue. Diagnoses of Multiple food allergies, Intestinal malabsorption, unspecified type, Vitamin D deficiency, Anemia, unspecified type, Chronic osteoarthritis, and Insomnia due to anxiety and fear were also pertinent to this visit.  HPI Anna [name] presents for establishment of care . Referred by daughter Alanson Puls .    This visit occurred during the SARS-CoV-2 public health emergency.  Safety protocols were in place, including screening questions prior to the visit, additional usage of staff PPE, and extensive cleaning of exam room while observing appropriate contact time as indicated for disinfecting solutions.   Anna [name] is a 75 yr old female who was diagnosed with Lyme's Disease fin 2016 , which she received from a tick bite. 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 Maisie Fus,  An immunologist at [facility]  (appt in near future).  She states that since 2016  "my whole life has turned upside down."  Due to profound fatigue and joint pain.  Has given up red meat,  Now recently has had an allergic reaction to the injection fluid present in a commercially obtained  roasted chicken .  Did not develop  hives,  But had  facial erythema and angioedema,  Mucous membranes peeled ,   Daughter concerned about her previous PCP's lack of attention to these concerns and a request for transfer of care was received ,   Insomnia:  Started with divorce in 2000,  cardiologist recommended use of alprazolam , has used it nightly  since 1996, 0. 5 mg dose with no escalation of use. On the occasions were she has omitted the dose,  She wakes up a " pounding heart".  She has had no arrhythmias since her  ablation in 1996.  She does not exercise regularly,  By choice  She has  Has chronic Joint pain  involving hip and knees ,  Fatigue,  Multiple food allergies. 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       History Anna [name] has a past medical history of Acid reflux, Anxiety, Bilateral cataracts, CIN I (cervical intraepithelial neoplasia I), Colon polyps, Hemorrhoid, History of basal cell carcinoma (BCC) excision, History of gallstones, History of melanoma excision (RIGHT LEG  --  2012), History of Wolff-Parkinson-White (WPW) syndrome, Lyme disease, Mild carotid artery disease ([place]), and SVT (supraventricular tachycardia) ([place]) (10/31/2014).   She has a past surgical history that includes Cervical biopsy w/ loop electrode excision (2005   dr Cherylann Banas); Tubal ligation; Colposcopy; melanoma excised; Cardiac electrophysiology study and ablation (1997); Hysteroscopy with D & C (02/13/2012); SVT-CARDIAC ABLATION; Laparoscopic cholecystectomy (05-16-2005  dr cornett [facility]); transthoracic echocardiogram (12-10-2012   dr Wynonia Lawman); Breast surgery (Left, 1999); Colonoscopy; Upper gi endoscopy; Cataract extraction, bilateral; and Vulvectomy (N/A, 07/25/2017).   Her family history includes Breast cancer in her paternal aunt; Cancer in her mother and sister; Diabetes in her brother, father, and paternal grandfather; Heart attack in her father; Heart disease in her brother, father, paternal grandfather, and paternal grandmother; Hyperlipidemia in her father, paternal grandfather, and paternal grandmother; Stroke in her maternal grandfather, maternal grandmother, paternal grandfather, and paternal grandmother.She reports that she has never smoked. She has never used smokeless tobacco. She reports current alcohol use. She reports that  she does not use drugs.  Outpatient Medications Prior to Visit  Medication Sig Dispense Refill  . cetirizine (ZYRTEC) 10 MG tablet Take 10 mg by mouth daily.    Marland Kitchen EPINEPHrine 0.3 mg/0.3 mL IJ SOAJ injection Use as directed for  severe allergic reaction 2 Device 2  . estradiol (ESTRACE) 0.1 MG/GM vaginal cream Compounded Item: Estrogen Cream Instruction  Discard applicator  Apply pea sized amount to tip of finger to urethra before bed. Wash hands well after application. Use Monday, Wednesday and Friday 42.5 g 12  . fluticasone (FLONASE) 50 MCG/ACT nasal spray Place into both nostrils daily.    . hydrocortisone 2.5 % cream Apply topically 2 (two) times daily. Apply to hemorrhoid area 28 g 3  . ALPRAZolam (XANAX) 0.5 MG tablet TAKE 1 TABLET AT BEDTIME AS NEEDED 30 tablet 4  . azelastine (OPTIVAR) 0.05 % ophthalmic solution Place 1 drop in each eye twice a day as needed for watery, itchy, red eyes (Patient not taking: Reported on 06/19/2020) 6 mL 5  . CVS GENTLE LAXATIVE 5 MG EC tablet Take by mouth. (Patient not taking: Reported on 06/19/2020)    . peppermint oil liquid Take by mouth. (Patient not taking: Reported on 06/19/2020)     No facility-administered medications prior to visit.    Review of Systems:  Patient denies headache, fevers, malaise, unintentional weight loss, skin rash, eye pain, sinus congestion and sinus pain, sore throat, dysphagia,  hemoptysis , cough, dyspnea, wheezing, chest pain, palpitations, orthopnea, edema, abdominal pain, nausea, melena, diarrhea, constipation, flank pain, dysuria, hematuria, urinary  Frequency, nocturia, numbness, tingling, seizures,  Focal weakness, Loss of consciousness,  Tremor, insomnia, depression, anxiety, and suicidal ideation.     Objective:  BP (!) 150/92 (BP Location: Left Arm, Patient Position: Sitting, Cuff Size: Large)   Pulse 84   Temp 98 F (36.7 C) (Oral)   Resp 16   Ht 5\' 8"  (1.727 m)   Wt 211 lb 3.2 oz (95.8 kg)   SpO2 97%   BMI 32.11 kg/m   Physical Exam:  General appearance: alert, cooperative and appears stated age Ears: normal TM's and external ear canals both ears Throat: lips, mucosa, and tongue normal; teeth and gums normal Neck: no  adenopathy, no carotid bruit, supple, symmetrical, trachea midline and thyroid not enlarged, symmetric, no tenderness/mass/nodules Back: symmetric, no curvature. ROM normal. No CVA tenderness. Lungs: clear to auscultation bilaterally Heart: regular rate and rhythm, S1, S2 normal, no murmur, click, rub or gallop Abdomen: soft, non-tender; bowel sounds normal; no masses,  no organomegaly Pulses: 2+ and symmetric MSK:  No synovitis of small joints noted on exam today.  Skin: Skin color, texture, turgor normal. No rashes or lesions Lymph nodes: Cervical, supraclavicular, and axillary nodes normal.  Assessment & Plan:   Problem List Items Addressed This Visit      Unprioritized   Insomnia due to anxiety and fear    She has been alprazolam dependent since 2015 without dose escalation.  Refilled today.  The risks and benefits of benzodiazepine use were reviewed  with patient today including excessive sedation leading to respiratory depression,  impaired thinking/driving, and addiction.  Patient was advised to avoid concurrent use with alcohol, to use medication only as needed and not to share with others  .       Intestinal malabsorption   Relevant Orders   Vitamin B12 (Completed)   Vitamin B6   Vitamin B1   Chronic osteoarthritis    Her symptoms improve  with activity after < 15 minutes.  Unclear if there are additional diagnostic possibilities other than OA.  Particularly the Lymes Disease reported by patient that was apparently diagnosed in 2016 . However there is no documentation of diagnosis and no data/labs upon review of office notes in Epic from [facility].  I reiterated the need for regular exercise,  Regardless of the origin of her joint pain, to maintain muscle tone and provide adequate joint support      Multiple food allergies    Chronologically occurring after a tick bite.  Alpha gal reaction reported by patient; labs nont available.  Await referral to Immunology for  clarification.  Checking for vitamin deficiencies given the restriction of food she has maintained.      Relevant Orders   Vitamin A   Vitamin K1, Serum   Vitamin E   VITAMIN D 25 Hydroxy (Vit-D Deficiency, Fractures) (Completed)   T4 AND TSH (Completed)   Magnesium (Completed)   Folate, RBC and Serum (Completed)   Zinc   Copper, Free    Other Visit Diagnoses    Chronic fatigue    -  Primary   Relevant Orders   Comprehensive metabolic panel (Completed)   TSH (Completed)   CBC with Differential/Platelet (Completed)   Vitamin D deficiency       Relevant Orders   VITAMIN D 25 Hydroxy (Vit-D Deficiency, Fractures) (Completed)   Anemia, unspecified type       Relevant Orders   Iron and TIBC (Completed)      I have discontinued Carmelina Dane. Hallowell's azelastine, peppermint oil, and CVS Gentle Laxative. I am also having her maintain her cetirizine, fluticasone, EPINEPHrine, hydrocortisone, estradiol, and ALPRAZolam.  Meds ordered this encounter  Medications  . ALPRAZolam (XANAX) 0.5 MG tablet    Sig: TAKE 1 TABLET AT BEDTIME AS NEEDED    Dispense:  30 tablet    Refill:  4    Medications Discontinued During This Encounter  Medication Reason  . azelastine (OPTIVAR) 0.05 % ophthalmic solution   . CVS GENTLE LAXATIVE 5 MG EC tablet   . peppermint oil liquid   . ALPRAZolam (XANAX) 0.5 MG tablet Reorder    Follow-up: Return in about 3 months (around 09/19/2020).   Crecencio Mc, MD

## 2020-06-19 NOTE — Patient Instructions (Addendum)
Welcome!  The Diet I recommended is the [employer] Smoothie 10 day DeTox Diet.  It eliminates:  Caffeine, dairy, sugar gluten,and alcohol    Meanwhile we will check for vitamin deficiencies given your limited diet

## 2020-06-20 DIAGNOSIS — F5105 Insomnia due to other mental disorder: Secondary | ICD-10-CM | POA: Insufficient documentation

## 2020-06-20 DIAGNOSIS — F409 Phobic anxiety disorder, unspecified: Secondary | ICD-10-CM | POA: Insufficient documentation

## 2020-06-20 DIAGNOSIS — K909 Intestinal malabsorption, unspecified: Secondary | ICD-10-CM | POA: Insufficient documentation

## 2020-06-20 DIAGNOSIS — Z91018 Allergy to other foods: Secondary | ICD-10-CM | POA: Insufficient documentation

## 2020-06-20 LAB — IRON AND TIBC
Iron Saturation: 16 % (ref 15–55)
Iron: 48 ug/dL (ref 27–139)
Total Iron Binding Capacity: 309 ug/dL (ref 250–450)
UIBC: 261 ug/dL (ref 118–369)

## 2020-06-20 LAB — CBC WITH DIFFERENTIAL/PLATELET
Basophils Absolute: 0.1 10*3/uL (ref 0.0–0.1)
Basophils Relative: 1.4 % (ref 0.0–3.0)
Eosinophils Absolute: 0.2 10*3/uL (ref 0.0–0.7)
Eosinophils Relative: 2.5 % (ref 0.0–5.0)
HCT: 37.5 % (ref 36.0–46.0)
Hemoglobin: 12.7 g/dL (ref 12.0–15.0)
Lymphocytes Relative: 36.8 % (ref 12.0–46.0)
Lymphs Abs: 2.7 10*3/uL (ref 0.7–4.0)
MCHC: 33.8 g/dL (ref 30.0–36.0)
MCV: 94.4 fl (ref 78.0–100.0)
Monocytes Absolute: 0.4 10*3/uL (ref 0.1–1.0)
Monocytes Relative: 6.2 % (ref 3.0–12.0)
Neutro Abs: 3.9 10*3/uL (ref 1.4–7.7)
Neutrophils Relative %: 53.1 % (ref 43.0–77.0)
Platelets: 298 10*3/uL (ref 150.0–400.0)
RBC: 3.97 Mil/uL (ref 3.87–5.11)
RDW: 13.5 % (ref 11.5–15.5)
WBC: 7.3 10*3/uL (ref 4.0–10.5)

## 2020-06-20 LAB — FOLATE, RBC AND SERUM
Folate: 11.8 ng/mL (ref >3.0)
Hematocrit: 37 % (ref 34.0–46.6)

## 2020-06-20 LAB — COMPREHENSIVE METABOLIC PANEL
ALT: 13 U/L (ref 0–35)
AST: 13 U/L (ref 0–37)
Albumin: 4.2 g/dL (ref 3.5–5.2)
Alkaline Phosphatase: 79 U/L (ref 39–117)
BUN: 15 mg/dL (ref 6–23)
CO2: 25 mEq/L (ref 19–32)
Calcium: 9.3 mg/dL (ref 8.4–10.5)
Chloride: 108 mEq/L (ref 96–112)
Creatinine, Ser: 0.74 mg/dL (ref 0.40–1.20)
GFR: 80.29 mL/min (ref >60.00)
Glucose, Bld: 90 mg/dL (ref 70–99)
Potassium: 3.8 mEq/L (ref 3.5–5.1)
Sodium: 141 mEq/L (ref 135–145)
Total Bilirubin: 0.2 mg/dL (ref 0.2–1.2)
Total Protein: 7 g/dL (ref 6.0–8.3)

## 2020-06-20 LAB — MAGNESIUM: Magnesium: 2.1 mg/dL (ref 1.5–2.5)

## 2020-06-20 LAB — VITAMIN D 25 HYDROXY (VIT D DEFICIENCY, FRACTURES): VITD: 12.46 ng/mL — ABNORMAL LOW (ref 30.00–100.00)

## 2020-06-20 LAB — T4 AND TSH
T4, Total: 7.9 ug/dL (ref 4.5–12.0)
TSH: 2.07 u[IU]/mL (ref 0.450–4.500)

## 2020-06-20 LAB — VITAMIN B12: Vitamin B-12: 129 pg/mL — ABNORMAL LOW (ref 211–911)

## 2020-06-20 LAB — TSH: TSH: 1.98 u[IU]/mL (ref 0.35–4.50)

## 2020-06-20 LAB — SPECIMEN STATUS REPORT

## 2020-06-20 NOTE — Assessment & Plan Note (Signed)
Her symptoms improve with activity after < 15 minutes.  Unclear if there are additional diagnostic possibilities other than OA.  Particularly the Lymes Disease reported by patient that was apparently diagnosed in 2016 . However there is no documentation of diagnosis and no data/labs upon review of office notes in Epic from [facility].  I reiterated the need for regular exercise,  Regardless of the origin of her joint pain, to maintain muscle tone and provide adequate joint support

## 2020-06-20 NOTE — Assessment & Plan Note (Signed)
She has been alprazolam dependent since 2015 without dose escalation.  Refilled today.  The risks and benefits of benzodiazepine use were reviewed  with patient today including excessive sedation leading to respiratory depression,  impaired thinking/driving, and addiction.  Patient was advised to avoid concurrent use with alcohol, to use medication only as needed and not to share with others  .

## 2020-06-20 NOTE — Assessment & Plan Note (Signed)
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  Alpha gal reaction reported by patient; labs nont available.  Await referral to Immunology for clarification.  Checking for vitamin deficiencies given the restriction of food she has maintained.

## 2020-06-21 ENCOUNTER — Telehealth: Payer: Self-pay

## 2020-06-21 ENCOUNTER — Other Ambulatory Visit: Payer: Self-pay

## 2020-06-21 MED ORDER — PREMARIN 0.625 MG/GM VA CREA: TOPICAL_CREAM | VAGINAL | 12 refills | Status: DC

## 2020-06-21 MED ORDER — ESTRADIOL 0.1 MG/GM VA CREA: 1.0000 | TOPICAL_CREAM | Freq: Every day | VAGINAL | 12 refills | Status: DC

## 2020-06-21 NOTE — Telephone Encounter (Signed)
Pt called asking for estrace instead of premarin due to price. sw pt and sent meds to [facility] as it is cheaper. Pt aware and agreed with plan.

## 2020-06-24 ENCOUNTER — Encounter: Payer: Self-pay | Admitting: Internal Medicine

## 2020-06-24 ENCOUNTER — Other Ambulatory Visit: Payer: Self-pay | Admitting: Internal Medicine

## 2020-06-24 DIAGNOSIS — D518 Other vitamin B12 deficiency anemias: Secondary | ICD-10-CM

## 2020-06-24 DIAGNOSIS — E538 Deficiency of other specified B group vitamins: Secondary | ICD-10-CM | POA: Insufficient documentation

## 2020-06-24 DIAGNOSIS — E559 Vitamin D deficiency, unspecified: Secondary | ICD-10-CM | POA: Insufficient documentation

## 2020-06-24 MED ORDER — ERGOCALCIFEROL 1.25 MG (50000 UT) PO CAPS: 50000.0000 [IU] | ORAL_CAPSULE | ORAL | 2 refills | Status: DC

## 2020-06-27 ENCOUNTER — Ambulatory Visit (INDEPENDENT_AMBULATORY_CARE_PROVIDER_SITE_OTHER): Payer: Medicare Other

## 2020-06-27 ENCOUNTER — Other Ambulatory Visit: Payer: Self-pay

## 2020-06-27 DIAGNOSIS — D518 Other vitamin B12 deficiency anemias: Secondary | ICD-10-CM

## 2020-06-27 MED ORDER — CYANOCOBALAMIN 1000 MCG/ML IJ SOLN
1000.0000 ug | Freq: Once | INTRAMUSCULAR | Status: AC
  Administered 2020-06-27: 1000 ug via INTRAMUSCULAR

## 2020-06-27 NOTE — Progress Notes (Signed)
Patient presented for B 12 injection to right deltoid, patient voiced no concerns nor showed any signs of distress during injection. 

## 2020-07-04 ENCOUNTER — Other Ambulatory Visit: Payer: Self-pay

## 2020-07-04 ENCOUNTER — Other Ambulatory Visit: Payer: Self-pay | Admitting: Internal Medicine

## 2020-07-04 ENCOUNTER — Ambulatory Visit (INDEPENDENT_AMBULATORY_CARE_PROVIDER_SITE_OTHER): Payer: Medicare Other

## 2020-07-04 DIAGNOSIS — D518 Other vitamin B12 deficiency anemias: Secondary | ICD-10-CM

## 2020-07-04 MED ORDER — CYANOCOBALAMIN 1000 MCG/ML IJ SOLN
1000.0000 ug | Freq: Once | INTRAMUSCULAR | Status: AC
  Administered 2020-07-04: 1000 ug via INTRAMUSCULAR

## 2020-07-04 MED ORDER — VITAMIN A-BETA CAROTENE 25000 UNITS PO CAPS: 25000.0000 [IU] | ORAL_CAPSULE | Freq: Every day | ORAL | 1 refills | Status: DC

## 2020-07-04 NOTE — Progress Notes (Signed)
Patient presented for B 12 injection to right deltoid, patient voiced no concerns nor showed any signs of distress during injection. 

## 2020-07-06 ENCOUNTER — Other Ambulatory Visit: Payer: Self-pay

## 2020-07-06 LAB — VITAMIN K1, SERUM: Vitamin K: 1866 pg/mL — ABNORMAL HIGH (ref 130–1500)

## 2020-07-07 LAB — VITAMIN A: Vitamin A (Retinoic Acid): 36 ug/dL — ABNORMAL LOW (ref 38–98)

## 2020-07-07 LAB — VITAMIN B6

## 2020-07-07 LAB — VITAMIN E
Gamma-Tocopherol (Vit E): 3.2 mg/L (ref <=4.3)
Vitamin E (Alpha Tocopherol): 12.6 mg/L (ref 5.7–19.9)

## 2020-07-07 LAB — COPPER, FREE: COPPER - FREE, SERUM/PLASMA: NOT DETECTED mcg/L

## 2020-07-07 LAB — ZINC: Zinc: 74 ug/dL (ref 60–130)

## 2020-07-07 LAB — VITAMIN B1: Vitamin B1 (Thiamine): 7 nmol/L — ABNORMAL LOW (ref 8–30)

## 2020-07-11 ENCOUNTER — Ambulatory Visit (INDEPENDENT_AMBULATORY_CARE_PROVIDER_SITE_OTHER): Payer: Medicare Other

## 2020-07-11 ENCOUNTER — Other Ambulatory Visit: Payer: Self-pay

## 2020-07-11 DIAGNOSIS — E538 Deficiency of other specified B group vitamins: Secondary | ICD-10-CM | POA: Diagnosis not present

## 2020-07-11 MED ORDER — CYANOCOBALAMIN 1000 MCG/ML IJ SOLN
1000.0000 ug | Freq: Once | INTRAMUSCULAR | Status: AC
  Administered 2020-07-11: 1000 ug via INTRAMUSCULAR

## 2020-07-11 NOTE — Progress Notes (Addendum)
Anna [name] [place] presents today for injection per MD orders. B12 injection administered IM in right Upper Arm. Administration without incident. Patient tolerated well.  Hermenegildo Clausen,cma

## 2020-07-18 ENCOUNTER — Ambulatory Visit (INDEPENDENT_AMBULATORY_CARE_PROVIDER_SITE_OTHER): Payer: Medicare Other

## 2020-07-18 ENCOUNTER — Other Ambulatory Visit: Payer: Self-pay

## 2020-07-18 DIAGNOSIS — E538 Deficiency of other specified B group vitamins: Secondary | ICD-10-CM

## 2020-07-18 MED ORDER — CYANOCOBALAMIN 1000 MCG/ML IJ SOLN
1000.0000 ug | Freq: Once | INTRAMUSCULAR | Status: AC
  Administered 2020-07-18: 1000 ug via INTRAMUSCULAR

## 2020-07-18 NOTE — Progress Notes (Signed)
Anna [name] presents today for injection per MD orders. B12 injection  administered IM in right Upper Arm. Administration without incident. Patient tolerated well.  Daana Petrasek,cma

## 2020-07-19 ENCOUNTER — Encounter: Payer: Medicare Other | Admitting: Obstetrics and Gynecology

## 2020-07-25 ENCOUNTER — Ambulatory Visit: Payer: Medicare Other

## 2020-08-01 LAB — HM MAMMOGRAPHY

## 2020-08-03 ENCOUNTER — Other Ambulatory Visit: Payer: Self-pay

## 2020-08-10 ENCOUNTER — Encounter: Payer: Self-pay | Admitting: Internal Medicine

## 2020-08-22 ENCOUNTER — Other Ambulatory Visit: Payer: Self-pay

## 2020-08-22 ENCOUNTER — Ambulatory Visit (INDEPENDENT_AMBULATORY_CARE_PROVIDER_SITE_OTHER): Payer: Medicare Other

## 2020-08-22 DIAGNOSIS — E538 Deficiency of other specified B group vitamins: Secondary | ICD-10-CM

## 2020-08-22 MED ORDER — CYANOCOBALAMIN 1000 MCG/ML IJ SOLN
1000.0000 ug | Freq: Once | INTRAMUSCULAR | Status: AC
  Administered 2020-08-22: 1000 ug via INTRAMUSCULAR

## 2020-08-22 NOTE — Progress Notes (Signed)
Patient presented for B 12 injection to right deltoid, patient voiced no concerns nor showed any signs of distress during injection. 

## 2020-09-19 ENCOUNTER — Ambulatory Visit (INDEPENDENT_AMBULATORY_CARE_PROVIDER_SITE_OTHER): Payer: Medicare Other | Admitting: Internal Medicine

## 2020-09-19 ENCOUNTER — Encounter: Payer: Self-pay | Admitting: Internal Medicine

## 2020-09-19 ENCOUNTER — Other Ambulatory Visit: Payer: Self-pay

## 2020-09-19 VITALS — BP 140/94 | HR 79 | Temp 96.3°F | Resp 16 | Ht 68.0 in | Wt 209.4 lb

## 2020-09-19 DIAGNOSIS — R609 Edema, unspecified: Secondary | ICD-10-CM | POA: Diagnosis not present

## 2020-09-19 DIAGNOSIS — E538 Deficiency of other specified B group vitamins: Secondary | ICD-10-CM | POA: Diagnosis not present

## 2020-09-19 DIAGNOSIS — R5383 Other fatigue: Secondary | ICD-10-CM

## 2020-09-19 DIAGNOSIS — K9089 Other intestinal malabsorption: Secondary | ICD-10-CM | POA: Diagnosis not present

## 2020-09-19 DIAGNOSIS — I10 Essential (primary) hypertension: Secondary | ICD-10-CM

## 2020-09-19 DIAGNOSIS — R0683 Snoring: Secondary | ICD-10-CM

## 2020-09-19 DIAGNOSIS — R030 Elevated blood-pressure reading, without diagnosis of hypertension: Secondary | ICD-10-CM

## 2020-09-19 DIAGNOSIS — R0600 Dyspnea, unspecified: Secondary | ICD-10-CM

## 2020-09-19 NOTE — Progress Notes (Signed)
Subjective:  Patient ID: Anna [name], female    DOB: 1945-08-02  Age: 75 y.o. MRN: [id number]  CC: The primary encounter diagnosis was B12 deficiency. Diagnoses of Edema, unspecified type, Dyspnea, unspecified type, Other specified intestinal malabsorption, Snoring, Elevated blood pressure reading, Fatigue, unspecified type, and Essential hypertension were also pertinent to this visit.  HPI [name] presents for follow up on multiple issues  This visit occurred during the SARS-CoV-2 public health emergency.  Safety protocols were in place, including screening questions prior to the visit, additional usage of staff PPE, and extensive cleaning of exam room while observing appropriate contact time as indicated for disinfecting solutions.    Low B12:  getting injections weekly .  IF factor ordered but not done at previous RN visit.   Fatigue persistent.  New onset Lower extremity swelling, started 2 weeks ago ,  now noting edema to her knees. No recently prescribed new meds,  but uses advil nearly daily for joint pain.  BP up as well,  .  History of loud snoring.  uses several pillows to elevated head .  Has not tried a wedge pillow no prior sleep study    Has changed diet .    Using  a blender for a green smoothie diet   Taking new meds since march from [facility] : ketotifen  and gastrocrom for alpha gal allergy    Outpatient Medications Prior to Visit  Medication Sig Dispense Refill   ALPRAZolam (XANAX) 0.5 MG tablet TAKE 1 TABLET AT BEDTIME AS NEEDED 30 tablet 4   Beta Carotene (VITAMIN A) 25000 UNIT capsule Take 1 capsule (25,000 Units total) by mouth daily. 90 capsule 1   cetirizine (ZYRTEC) 10 MG tablet Take 10 mg by mouth daily.     conjugated estrogens (PREMARIN) vaginal cream Estrogen Cream Instruction Discard applicator Apply pea sized amount to tip of finger to urethra before bed. Wash hands well after application. Use Monday, Wednesday and Friday 42.5 g 12    cromolyn (GASTROCROM) 100 MG/5ML solution Take 200 mg by mouth 4 (four) times daily as needed.     EPINEPHrine 0.3 mg/0.3 mL IJ SOAJ injection Use as directed for severe allergic reaction 2 Device 2   ergocalciferol (DRISDOL) 1.25 MG (50000 UT) capsule Take 1 capsule (50,000 Units total) by mouth once a week. 4 capsule 2   estradiol (ESTRACE) 0.1 MG/GM vaginal cream Place 1 Applicatorful vaginally at bedtime. 42.5 g 12   fluticasone (FLONASE) 50 MCG/ACT nasal spray Place into both nostrils daily.     hydrocortisone (ANUSOL-HC) 2.5 % rectal cream SMARTSIG:1 Topical Every Night     hydrocortisone 2.5 % cream Apply topically 2 (two) times daily. Apply to hemorrhoid area 28 g 3   No facility-administered medications prior to visit.    Review of Systems;  Patient denies headache, fevers, malaise, unintentional weight loss, skin rash, eye pain, sinus congestion and sinus pain, sore throat, dysphagia,  hemoptysis , cough, dyspnea, wheezing, chest pain, palpitations, orthopnea, edema, abdominal pain, nausea, melena, diarrhea, constipation, flank pain, dysuria, hematuria, urinary  Frequency, nocturia, numbness, tingling, seizures,  Focal weakness, Loss of consciousness,  Tremor, insomnia, depression, anxiety, and suicidal ideation.      Objective:  BP (!) 140/94 (BP Location: Left Arm, Patient Position: Sitting, Cuff Size: Large)   Pulse 79   Temp (!) 96.3 F (35.7 C) (Temporal)   Resp 16   Ht 5\' 8"  (1.727 m)   Wt 209 lb 6.4 oz (95  kg)   SpO2 97%   BMI 31.84 kg/m   BP Readings from Last 3 Encounters:  09/19/20 (!) 140/94  06/19/20 (!) 150/92  06/02/20 (!) 104/52    Wt Readings from Last 3 Encounters:  09/19/20 209 lb 6.4 oz (95 kg)  06/19/20 211 lb 3.2 oz (95.8 kg)  06/02/20 211 lb (95.7 kg)    General appearance: alert, cooperative and appears stated age Ears: normal TM's and external ear canals both ears Throat: lips, mucosa, and tongue normal; teeth and gums normal Neck: no  adenopathy, no carotid bruit, supple, symmetrical, trachea midline and thyroid not enlarged, symmetric, no tenderness/mass/nodules Back: symmetric, no curvature. ROM normal. No CVA tenderness. Lungs: clear to auscultation bilaterally Heart: regular rate and rhythm, S1, S2 normal, no murmur, click, rub or gallop Abdomen: soft, non-tender; bowel sounds normal; no masses,  no organomegaly Pulses: 2+ and symmetric Skin: Skin color, texture, turgor normal. No rashes or lesions Lymph nodes: Cervical, supraclavicular, and axillary nodes normal.  No results found for: HGBA1C  Lab Results  Component Value Date   CREATININE 0.88 09/19/2020   CREATININE 0.74 06/19/2020   CREATININE 0.89 06/02/2020    Lab Results  Component Value Date   WBC 7.3 06/19/2020   HGB 12.7 06/19/2020   HCT 37.0 06/19/2020   HCT 37.5 06/19/2020   PLT 298.0 06/19/2020   GLUCOSE 80 09/19/2020   CHOL 201 (H) 12/10/2012   TRIG 77 12/10/2012   HDL 47 12/10/2012   LDLCALC 139 (H) 12/10/2012   ALT 13 06/19/2020   AST 13 06/19/2020   NA 141 09/19/2020   K 4.4 09/19/2020   CL 106 09/19/2020   CREATININE 0.88 09/19/2020   BUN 17 09/19/2020   CO2 28 09/19/2020   TSH 2.17 09/19/2020   INR 1.03 12/10/2012   MICROALBUR 4.2 (H) 09/19/2020       Assessment & Plan:   Problem List Items Addressed This Visit       Unprioritized   B12 deficiency - Primary    New diagnosis managed with weekly injections initially  Level improved.  Folate normal.  IF ab level pending        Relevant Orders   B12 and Folate Panel (Completed)   Intrinsic Factor Antibodies   Edema    New onset,  With elevated blood pressure,  Loud snoring and fatigue. Ruling out nephrotic syndrome and sleep apnea .  Will start  ARB/hct   Lab Results  Component Value Date   LABMICR See below: 05/02/2020   LABMICR See below: 01/25/2020   MICROALBUR 4.2 (H) 09/19/2020   Lab Results  Component Value Date   NA 141 09/19/2020   K 4.4 09/19/2020    CL 106 09/19/2020   CO2 28 09/19/2020   Lab Results  Component Value Date   CREATININE 0.88 09/19/2020           Relevant Orders   Basic metabolic panel (Completed)   Microalbumin / creatinine urine ratio (Completed)   TSH (Completed)   Ambulatory referral to Sleep Studies   Essential hypertension    STARTING LOSARTAN/HCT for concurrent microalbuminuria   Lab Results  Component Value Date   CREATININE 0.88 09/19/2020   Lab Results  Component Value Date   LABMICR See below: 05/02/2020   LABMICR See below: 01/25/2020   MICROALBUR 4.2 (H) 09/19/2020     Lab Results  Component Value Date   NA 141 09/19/2020   K 4.4 09/19/2020   CL 106 09/19/2020  CO2 28 09/19/2020          Relevant Medications   losartan-hydrochlorothiazide (HYZAAR) 50-12.5 MG tablet   Intestinal malabsorption    Secondary to alph gal allergy, now managed with H1 blocker and mast cell inhibitor prescribed by Immunology        Other Visit Diagnoses     Dyspnea, unspecified type       Relevant Orders   B Nat Peptide (Completed)   Snoring       Relevant Orders   Ambulatory referral to Sleep Studies   Elevated blood pressure reading       Relevant Orders   Ambulatory referral to Sleep Studies   Fatigue, unspecified type       Relevant Orders   Ambulatory referral to Sleep Studies       I am having Bebe Liter start on losartan-hydrochlorothiazide. I am also having her maintain her cetirizine, fluticasone, EPINEPHrine, hydrocortisone, ALPRAZolam, Premarin, estradiol, ergocalciferol, vitamin A, cromolyn, and hydrocortisone.  Meds ordered this encounter  Medications   losartan-hydrochlorothiazide (HYZAAR) 50-12.5 MG tablet    Sig: Take 1 tablet by mouth daily.    Dispense:  90 tablet    Refill:  1     There are no discontinued medications.  Follow-up: Return in about 3 months (around 12/20/2020).   Crecencio Mc, MD

## 2020-09-19 NOTE — Patient Instructions (Addendum)
No more aleve or Advil (naproxen,  ibuprofen).  It can cause swelling  Just use tylenol   You can add up to 2000 mg of acetominophen (tylenol) every day safely  In divided doses (500 mg every 6 hours  Or 1000 mg every 12 hours.)    I am ordering a sleep study to investigate your snoring , fatigue,  fluid retention,  and new blood pressure issues.  I will start you on a medication for fluid retention one I see your labs from today    Start looking for a bed wedge to use instead of  3 pillows

## 2020-09-20 DIAGNOSIS — R609 Edema, unspecified: Secondary | ICD-10-CM | POA: Insufficient documentation

## 2020-09-20 DIAGNOSIS — I10 Essential (primary) hypertension: Secondary | ICD-10-CM | POA: Insufficient documentation

## 2020-09-20 LAB — B12 AND FOLATE PANEL
Folate: 14.8 ng/mL (ref >5.9)
Vitamin B-12: 288 pg/mL (ref 211–911)

## 2020-09-20 LAB — MICROALBUMIN / CREATININE URINE RATIO
Creatinine,U: 164.3 mg/dL
Microalb Creat Ratio: 2.6 mg/g (ref 0.0–30.0)
Microalb, Ur: 4.2 mg/dL — ABNORMAL HIGH (ref 0.0–1.9)

## 2020-09-20 LAB — BASIC METABOLIC PANEL
BUN: 17 mg/dL (ref 6–23)
CO2: 28 mEq/L (ref 19–32)
Calcium: 9.2 mg/dL (ref 8.4–10.5)
Chloride: 106 mEq/L (ref 96–112)
Creatinine, Ser: 0.88 mg/dL (ref 0.40–1.20)
GFR: 65.1 mL/min (ref >60.00)
Glucose, Bld: 80 mg/dL (ref 70–99)
Potassium: 4.4 mEq/L (ref 3.5–5.1)
Sodium: 141 mEq/L (ref 135–145)

## 2020-09-20 LAB — TSH: TSH: 2.17 u[IU]/mL (ref 0.35–4.50)

## 2020-09-20 LAB — BRAIN NATRIURETIC PEPTIDE: Brain Natriuretic Peptide: 48 pg/mL (ref <100)

## 2020-09-20 MED ORDER — LOSARTAN POTASSIUM-HCTZ 50-12.5 MG PO TABS: 1.0000 | ORAL_TABLET | Freq: Every day | ORAL | 1 refills | Status: DC

## 2020-09-20 NOTE — Assessment & Plan Note (Signed)
New diagnosis managed with weekly injections initially  Level improved.  Folate normal.  IF ab level pending

## 2020-09-20 NOTE — Assessment & Plan Note (Addendum)
New onset,  With elevated blood pressure,  Loud snoring and fatigue. Ruling out nephrotic syndrome and sleep apnea .  Will start  ARB/hct   Lab Results  Component Value Date   LABMICR See below: 05/02/2020   LABMICR See below: 01/25/2020   MICROALBUR 4.2 (H) 09/19/2020   Lab Results  Component Value Date   NA 141 09/19/2020   K 4.4 09/19/2020   CL 106 09/19/2020   CO2 28 09/19/2020   Lab Results  Component Value Date   CREATININE 0.88 09/19/2020

## 2020-09-20 NOTE — Assessment & Plan Note (Signed)
STARTING LOSARTAN/HCT for concurrent microalbuminuria   Lab Results  Component Value Date   CREATININE 0.88 09/19/2020   Lab Results  Component Value Date   LABMICR See below: 05/02/2020   LABMICR See below: 01/25/2020   MICROALBUR 4.2 (H) 09/19/2020     Lab Results  Component Value Date   NA 141 09/19/2020   K 4.4 09/19/2020   CL 106 09/19/2020   CO2 28 09/19/2020

## 2020-09-20 NOTE — Assessment & Plan Note (Signed)
Secondary to alph gal allergy, now managed with H1 blocker and mast cell inhibitor prescribed by Immunology

## 2020-09-21 ENCOUNTER — Telehealth: Payer: Self-pay

## 2020-09-21 LAB — INTRINSIC FACTOR ANTIBODIES: Intrinsic Factor: NEGATIVE

## 2020-09-21 NOTE — Telephone Encounter (Signed)
Attempted to call pt. No answer no voicemail.  

## 2020-09-25 ENCOUNTER — Other Ambulatory Visit: Payer: Self-pay

## 2020-09-25 ENCOUNTER — Ambulatory Visit (INDEPENDENT_AMBULATORY_CARE_PROVIDER_SITE_OTHER): Payer: Medicare Other | Admitting: Family

## 2020-09-25 DIAGNOSIS — E538 Deficiency of other specified B group vitamins: Secondary | ICD-10-CM

## 2020-09-25 MED ORDER — CYANOCOBALAMIN 1000 MCG/ML IJ SOLN
1000.0000 ug | Freq: Once | INTRAMUSCULAR | Status: AC
  Administered 2020-09-25: 1000 ug via INTRAMUSCULAR

## 2020-09-28 NOTE — Progress Notes (Signed)
Patient presented for B12 injection. Given in the right deltoid. Patient tolerated well, no concerns stated.

## 2020-10-25 ENCOUNTER — Ambulatory Visit: Payer: Medicare Other

## 2020-10-30 ENCOUNTER — Other Ambulatory Visit: Payer: Self-pay

## 2020-10-30 ENCOUNTER — Other Ambulatory Visit
Admission: RE | Admit: 2020-10-30 | Discharge: 2020-10-30 | Disposition: A | Discharge status: Home / Self Care | Payer: Medicare Other | Source: Ambulatory Visit | Attending: Internal Medicine | Admitting: Internal Medicine

## 2020-10-30 DIAGNOSIS — Z01812 Encounter for preprocedural laboratory examination: Secondary | ICD-10-CM | POA: Insufficient documentation

## 2020-10-30 DIAGNOSIS — Z20822 Contact with and (suspected) exposure to covid-19: Secondary | ICD-10-CM | POA: Diagnosis not present

## 2020-10-30 LAB — SARS CORONAVIRUS 2 (TAT 6-24 HRS): SARS Coronavirus 2: NEGATIVE

## 2020-11-01 ENCOUNTER — Ambulatory Visit: Payer: Medicare Other | Attending: Neurology

## 2020-11-01 DIAGNOSIS — I10 Essential (primary) hypertension: Secondary | ICD-10-CM | POA: Diagnosis present

## 2020-11-01 DIAGNOSIS — G4733 Obstructive sleep apnea (adult) (pediatric): Secondary | ICD-10-CM | POA: Insufficient documentation

## 2020-11-02 ENCOUNTER — Other Ambulatory Visit: Payer: Self-pay

## 2020-11-08 DIAGNOSIS — G4733 Obstructive sleep apnea (adult) (pediatric): Secondary | ICD-10-CM

## 2020-11-09 DIAGNOSIS — G4733 Obstructive sleep apnea (adult) (pediatric): Secondary | ICD-10-CM | POA: Insufficient documentation

## 2020-11-09 NOTE — Telephone Encounter (Signed)
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 CPAP titration study needed

## 2020-11-10 NOTE — Telephone Encounter (Signed)
I'm glad the pillow is helping !  The titration study has been ordered.  If all f your sleep issues feel as though they have been resolved  by the time you are scheduled  you can defer

## 2020-11-19 ENCOUNTER — Other Ambulatory Visit: Payer: Self-pay | Admitting: Internal Medicine

## 2020-11-19 DIAGNOSIS — D649 Anemia, unspecified: Secondary | ICD-10-CM

## 2020-11-19 DIAGNOSIS — K909 Intestinal malabsorption, unspecified: Secondary | ICD-10-CM

## 2020-11-19 DIAGNOSIS — F409 Phobic anxiety disorder, unspecified: Secondary | ICD-10-CM

## 2020-11-20 NOTE — Telephone Encounter (Signed)
RX Refill:xanax Last Seen:09-19-20 Last ordered:07-05-20

## 2020-12-17 ENCOUNTER — Ambulatory Visit
Admission: EM | Admit: 2020-12-17 | Discharge: 2020-12-17 | Disposition: A | Discharge status: Home / Self Care | Payer: Medicare Other | Attending: Emergency Medicine | Admitting: Emergency Medicine

## 2020-12-17 ENCOUNTER — Other Ambulatory Visit: Payer: Self-pay

## 2020-12-17 ENCOUNTER — Encounter: Payer: Self-pay | Admitting: Emergency Medicine

## 2020-12-17 DIAGNOSIS — B373 Candidiasis of vulva and vagina: Secondary | ICD-10-CM

## 2020-12-17 DIAGNOSIS — B3731 Acute candidiasis of vulva and vagina: Secondary | ICD-10-CM

## 2020-12-17 LAB — POCT URINALYSIS DIP (MANUAL ENTRY)
Bilirubin, UA: NEGATIVE
Blood, UA: NEGATIVE
Glucose, UA: NEGATIVE mg/dL
Ketones, POC UA: NEGATIVE mg/dL
Leukocytes, UA: NEGATIVE
Nitrite, UA: NEGATIVE
Protein Ur, POC: NEGATIVE mg/dL
Spec Grav, UA: 1.01 (ref 1.010–1.025)
Urobilinogen, UA: 0.2 E.U./dL
pH, UA: 5 (ref 5.0–8.0)

## 2020-12-17 MED ORDER — FLUCONAZOLE 150 MG PO TABS: 150.0000 mg | ORAL_TABLET | Freq: Every day | ORAL | 0 refills | Status: AC

## 2020-12-17 NOTE — ED Provider Notes (Signed)
Subjective:    Anna [name] is a very pleasant 75 y.o. female who presents with concerns for UTI due to dysuria, lower back pain, lower abdominal pain and fatigue that started about 3 days ago.  Patient also reports vaginal itching, burning and some white discharge.  Patient states that if she had does not have a UTI it is likely a yeast infection.  No unilateral back pain, vomiting, fever.  Past medical history, past surgical history, current medications reviewed.  Allergies: is allergic to iodinated diagnostic agents, alpha-gal, gluten meal, milk-related compounds, shellfish allergy, sulfa antibiotics, azithromycin, other, and wheat bran.  Review of Systems See HPI   Objective:     Vitals:   12/17/20 1128  BP: (!) 146/75  Pulse: 76  Resp: 18  Temp: 98.1 F (36.7 C)  SpO2: 97%     General: Appears well-developed and well-nourished. No acute distress.  Cardiovascular: Normal rate Pulm/Chest: No respiratory distress Abdominal: No CVAT.  Neurological: Alert and oriented to person, place, and time.  Skin: Skin is warm and dry.  Psychiatric: Normal mood, affect, behavior, and thought content.  GU:  Deferred secondary to self collect specimen  Laboratory:  Orders Placed This Encounter  Procedures   POCT urinalysis dipstick   Results for orders placed or performed during the hospital encounter of 12/17/20  POCT urinalysis dipstick  Result Value Ref Range   Color, UA yellow yellow   Clarity, UA clear clear   Glucose, UA negative negative mg/dL   Bilirubin, UA negative negative   Ketones, POC UA negative negative mg/dL   Spec Grav, UA 1.010 1.010 - 1.025   Blood, UA negative negative   pH, UA 5.0 5.0 - 8.0   Protein Ur, POC negative negative mg/dL   Urobilinogen, UA 0.2 0.2 or 1.0 E.U./dL   Nitrite, UA Negative Negative   Leukocytes, UA Negative Negative   Assessment:   1. Vaginal yeast infection - fluconazole (DIFLUCAN) 150 MG tablet; Take 1 tablet (150 mg  total) by mouth daily for 1 day.  Dispense: 2 tablet; Refill: 0  Plan:   MDM: Patient presents with concerns for UTI due to dysuria, lower back pain, lower abdominal pain and fatigue that started about 3 days ago.  Patient also reports vaginal itching, burning and some white discharge.  Patient states that if she had does not have a UTI it is likely a yeast infection.  No unilateral back pain, vomiting, fever.  Urinalysis in clinic today is clear.  No concern for UTI.  Likely, yeast infection given her symptoms.  Rx Diflucan to her preferred pharmacy.  Advised to return to clinic with any unexpected vaginal bleeding or if there is new or increased pain in her pelvis.  Patient verbalized understanding and agreed with plan.  Patient stable upon discharge.  Return as needed.    Discharge Instructions      Take your first Diflucan today and your second Diflucan in the next 3 to 5 days if symptoms do not improve.  A vaginal yeast infection is caused by too many yeast cells in the vagina. This is common in women of all ages. Itching, vaginal discharge and irritation, and other symptoms can bother you. But yeast infections don't often cause other health problems.  Some medicines can increase your risk of getting a yeast infection. These include antibiotics, birth control pills, hormones, and steroids. You may also be more likely to get a yeast infection if you are pregnant, have diabetes, douche, or wear  tight clothes.  With treatment, most yeast infections get better in 2 to 3 days.  Do not use tampons while using a vaginal cream or suppository. The tampons can absorb the medicine. Use pads instead. Wear loose cotton clothing. Do not wear nylon or other fabric that holds body heat and moisture close to the skin. Try sleeping without underwear. Do not scratch. Relieve itching with a cold pack or a cool bath. Do not wash your vaginal area more than once a day. Use plain water or a mild, unscented  soap. Air-dry the vaginal area. Change out of wet swimsuits after swimming. Do not have sex until you have finished your treatment. Do not douche  Return to clinic if you have unexpected vaginal bleeding or if there is new or increased pain in your pelvis. Follow-up with your gynecologist if you have symptoms that return after treatment or if you have recurrent infections.           Serafina Royals, [place] 12/17/20 1213

## 2020-12-17 NOTE — ED Triage Notes (Signed)
Pt c/o dysuria, lower back pain, lower abdominal pain, and fatigue. Started about 3 days ago. Denies fever.

## 2020-12-17 NOTE — Discharge Instructions (Addendum)
Take your first Diflucan today and your second Diflucan in the next 3 to 5 days if symptoms do not improve.  A vaginal yeast infection is caused by too many yeast cells in the vagina. This is common in women of all ages. Itching, vaginal discharge and irritation, and other symptoms can bother you. But yeast infections don't often cause other health problems.  Some medicines can increase your risk of getting a yeast infection. These include antibiotics, birth control pills, hormones, and steroids. You may also be more likely to get a yeast infection if you are pregnant, have diabetes, douche, or wear tight clothes.  With treatment, most yeast infections get better in 2 to 3 days.  Do not use tampons while using a vaginal cream or suppository. The tampons can absorb the medicine. Use pads instead. Wear loose cotton clothing. Do not wear nylon or other fabric that holds body heat and moisture close to the skin. Try sleeping without underwear. Do not scratch. Relieve itching with a cold pack or a cool bath. Do not wash your vaginal area more than once a day. Use plain water or a mild, unscented soap. Air-dry the vaginal area. Change out of wet swimsuits after swimming. Do not have sex until you have finished your treatment. Do not douche  Return to clinic if you have unexpected vaginal bleeding or if there is new or increased pain in your pelvis. Follow-up with your gynecologist if you have symptoms that return after treatment or if you have recurrent infections.

## 2020-12-22 ENCOUNTER — Ambulatory Visit (INDEPENDENT_AMBULATORY_CARE_PROVIDER_SITE_OTHER): Payer: Medicare Other | Admitting: Internal Medicine

## 2020-12-22 ENCOUNTER — Other Ambulatory Visit: Payer: Self-pay

## 2020-12-22 ENCOUNTER — Encounter: Payer: Self-pay | Admitting: Internal Medicine

## 2020-12-22 VITALS — BP 138/80 | HR 83 | Temp 97.9°F | Ht 68.0 in | Wt 208.6 lb

## 2020-12-22 DIAGNOSIS — F5105 Insomnia due to other mental disorder: Secondary | ICD-10-CM | POA: Diagnosis not present

## 2020-12-22 DIAGNOSIS — I10 Essential (primary) hypertension: Secondary | ICD-10-CM

## 2020-12-22 DIAGNOSIS — K9089 Other intestinal malabsorption: Secondary | ICD-10-CM | POA: Diagnosis not present

## 2020-12-22 DIAGNOSIS — Z23 Encounter for immunization: Secondary | ICD-10-CM | POA: Diagnosis not present

## 2020-12-22 DIAGNOSIS — F409 Phobic anxiety disorder, unspecified: Secondary | ICD-10-CM

## 2020-12-22 LAB — BASIC METABOLIC PANEL
BUN: 12 mg/dL (ref 7–25)
CO2: 26 mmol/L (ref 20–32)
Calcium: 9.2 mg/dL (ref 8.6–10.4)
Chloride: 106 mmol/L (ref 98–110)
Creat: 0.91 mg/dL (ref 0.60–1.00)
Glucose, Bld: 111 mg/dL — ABNORMAL HIGH (ref 65–99)
Potassium: 3.6 mmol/L (ref 3.5–5.3)
Sodium: 141 mmol/L (ref 135–146)

## 2020-12-22 NOTE — Progress Notes (Signed)
Subjective:  Patient ID: Anna [name], female    DOB: 07-Feb-1946  Age: 75 y.o. MRN: [id number]  CC: The primary encounter diagnosis was Essential hypertension. Diagnoses of Need for immunization against influenza, Insomnia due to anxiety and fear, and Other specified intestinal malabsorption were also pertinent to this visit.  HPI Anna [name] presents for  Chief Complaint  Patient presents with   Follow-up    This visit occurred during the SARS-CoV-2 public health emergency.  Safety protocols were in place, including screening questions prior to the visit, additional usage of staff PPE, and extensive cleaning of exam room while observing appropriate contact time as indicated for disinfecting solutions.    Treated 9/18 with diflucan for vaginitis.  No UTI by UA.  Wearing thin pads to manage mild  stress incontinence   OSA:  mild to moderate , awaiting cPAP titration study  AHI was 25 during REM and 12  during non rem sleep 72 desats.    Positive depression screen in June:  symptoms related to sleep issues   Anxiety:  finally starting to feel back to normal   Still getting PAP smears.  History of STD from filandering husband . Last normal PAP smear ,.Was over 10 years ago all have been normal last one in 2019   Outpatient Medications Prior to Visit  Medication Sig Dispense Refill   ALPRAZolam (XANAX) 0.5 MG tablet TAKE 1 TABLET BY MOUTH EVERY DAY AT BEDTIME AS NEEDED 30 tablet 4   Beta Carotene (VITAMIN A) 25000 UNIT capsule Take 1 capsule (25,000 Units total) by mouth daily. 90 capsule 1   cetirizine (ZYRTEC) 10 MG tablet Take 10 mg by mouth daily.     conjugated estrogens (PREMARIN) vaginal cream Estrogen Cream Instruction Discard applicator Apply pea sized amount to tip of finger to urethra before bed. Wash hands well after application. Use Monday, Wednesday and Friday 42.5 g 12   cromolyn (GASTROCROM) 100 MG/5ML solution Take 200 mg by mouth 4 (four) times daily as  needed.     EPINEPHrine 0.3 mg/0.3 mL IJ SOAJ injection Use as directed for severe allergic reaction 2 Device 2   ergocalciferol (DRISDOL) 1.25 MG (50000 UT) capsule Take 1 capsule (50,000 Units total) by mouth once a week. 4 capsule 2   estradiol (ESTRACE) 0.1 MG/GM vaginal cream Place 1 Applicatorful vaginally at bedtime. 42.5 g 12   fluticasone (FLONASE) 50 MCG/ACT nasal spray Place into both nostrils daily.     hydrocortisone (ANUSOL-HC) 2.5 % rectal cream SMARTSIG:1 Topical Every Night     hydrocortisone 2.5 % cream Apply topically 2 (two) times daily. Apply to hemorrhoid area 28 g 3   losartan-hydrochlorothiazide (HYZAAR) 50-12.5 MG tablet Take 1 tablet by mouth daily. 90 tablet 1   No facility-administered medications prior to visit.    Review of Systems;  Patient denies headache, fevers, malaise, unintentional weight loss, skin rash, eye pain, sinus congestion and sinus pain, sore throat, dysphagia,  hemoptysis , cough, dyspnea, wheezing, chest pain, palpitations, orthopnea, edema, abdominal pain, nausea, melena, diarrhea, constipation, flank pain, dysuria, hematuria, urinary  Frequency, nocturia, numbness, tingling, seizures,  Focal weakness, Loss of consciousness,  Tremor, insomnia, depression, anxiety, and suicidal ideation.      Objective:  BP 138/80 (BP Location: Left Arm, Patient Position: Sitting, Cuff Size: Normal)   Pulse 83   Temp 97.9 F (36.6 C) (Oral)   Ht 5\' 8"  (1.727 m)   Wt 208 lb 9.6 oz (94.6 kg)   SpO2  98%   BMI 31.72 kg/m   BP Readings from Last 3 Encounters:  12/22/20 138/80  12/17/20 (!) 146/75  09/19/20 (!) 140/94    Wt Readings from Last 3 Encounters:  12/22/20 208 lb 9.6 oz (94.6 kg)  12/17/20 209 lb 7 oz (95 kg)  09/19/20 209 lb 6.4 oz (95 kg)    General appearance: alert, cooperative and appears stated age Ears: normal TM's and external ear canals both ears Throat: lips, mucosa, and tongue normal; teeth and gums normal Neck: no adenopathy,  no carotid bruit, supple, symmetrical, trachea midline and thyroid not enlarged, symmetric, no tenderness/mass/nodules Back: symmetric, no curvature. ROM normal. No CVA tenderness. Lungs: clear to auscultation bilaterally Heart: regular rate and rhythm, S1, S2 normal, no murmur, click, rub or gallop Abdomen: soft, non-tender; bowel sounds normal; no masses,  no organomegaly Pulses: 2+ and symmetric Skin: Skin color, texture, turgor normal. No rashes or lesions Lymph nodes: Cervical, supraclavicular, and axillary nodes normal.  No results found for: HGBA1C  Lab Results  Component Value Date   CREATININE 0.91 12/22/2020   CREATININE 0.88 09/19/2020   CREATININE 0.74 06/19/2020    Lab Results  Component Value Date   WBC 7.3 06/19/2020   HGB 12.7 06/19/2020   HCT 37.0 06/19/2020   HCT 37.5 06/19/2020   PLT 298.0 06/19/2020   GLUCOSE 111 (H) 12/22/2020   CHOL 201 (H) 12/10/2012   TRIG 77 12/10/2012   HDL 47 12/10/2012   LDLCALC 139 (H) 12/10/2012   ALT 13 06/19/2020   AST 13 06/19/2020   NA 141 12/22/2020   K 3.6 12/22/2020   CL 106 12/22/2020   CREATININE 0.91 12/22/2020   BUN 12 12/22/2020   CO2 26 12/22/2020   TSH 2.17 09/19/2020   INR 1.03 12/10/2012   MICROALBUR 4.2 (H) 09/19/2020    No results found.  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  Refilled today.  The risks and benefits of benzodiazepine use were reviewed  with patient today including excessive sedation leading to respiratory depression,  impaired thinking/driving, and addiction.  Patient was advised to avoid concurrent use with alcohol, to use medication only as needed and not to share with others  .       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  No orders of the defined types were placed in this encounter.   There are no discontinued medications.  Follow-up: Return in about 6 months (around 06/21/2021).   Crecencio Mc, MD

## 2020-12-22 NOTE — Patient Instructions (Addendum)
I'd like you to return in 6 months and plan to have a 4 hr fast so we can do labs at your visit  CPAP titration study has been ordered

## 2020-12-24 NOTE — Assessment & Plan Note (Signed)
She has been alprazolam dependent since 2015 without dose escalation.  Refilled today.  The risks and benefits of benzodiazepine use were reviewed  with patient today including excessive sedation leading to respiratory depression,  impaired thinking/driving, and addiction.  Patient was advised to avoid concurrent use with alcohol, to use medication only as needed and not to share with others  .

## 2020-12-24 NOTE — Assessment & Plan Note (Addendum)
Tolerating LOSARTAN/HCT for concurrent microalbuminuria   Lab Results  Component Value Date   CREATININE 0.91 12/22/2020   Lab Results  Component Value Date   LABMICR See below: 05/02/2020   LABMICR See below: 01/25/2020   MICROALBUR 4.2 (H) 09/19/2020     Lab Results  Component Value Date   NA 141 12/22/2020   K 3.6 12/22/2020   CL 106 12/22/2020   CO2 26 12/22/2020

## 2020-12-24 NOTE — Assessment & Plan Note (Signed)
Secondary to alph gal allergy, now managed with H1 blocker and mast cell inhibitor prescribed by Immunology

## 2021-02-13 ENCOUNTER — Other Ambulatory Visit: Payer: Self-pay | Admitting: Internal Medicine

## 2021-03-12 ENCOUNTER — Other Ambulatory Visit: Payer: Self-pay

## 2021-03-12 ENCOUNTER — Ambulatory Visit
Admission: RE | Admit: 2021-03-12 | Discharge: 2021-03-12 | Disposition: A | Discharge status: Home / Self Care | Payer: Medicare Other | Source: Ambulatory Visit | Attending: Emergency Medicine | Admitting: Emergency Medicine

## 2021-03-12 VITALS — BP 173/83 | HR 83 | Temp 97.7°F | Resp 18

## 2021-03-12 DIAGNOSIS — N390 Urinary tract infection, site not specified: Secondary | ICD-10-CM

## 2021-03-12 DIAGNOSIS — I10 Essential (primary) hypertension: Secondary | ICD-10-CM

## 2021-03-12 DIAGNOSIS — N950 Postmenopausal bleeding: Secondary | ICD-10-CM | POA: Diagnosis present

## 2021-03-12 DIAGNOSIS — R319 Hematuria, unspecified: Secondary | ICD-10-CM | POA: Diagnosis present

## 2021-03-12 LAB — POCT URINALYSIS DIP (MANUAL ENTRY)
Bilirubin, UA: NEGATIVE
Blood, UA: NEGATIVE
Glucose, UA: NEGATIVE mg/dL
Ketones, POC UA: NEGATIVE mg/dL
Nitrite, UA: POSITIVE — AB
Protein Ur, POC: NEGATIVE mg/dL
Spec Grav, UA: 1.015 (ref 1.010–1.025)
Urobilinogen, UA: 0.2 E.U./dL
pH, UA: 5.5 (ref 5.0–8.0)

## 2021-03-12 MED ORDER — CEPHALEXIN 500 MG PO CAPS
500.0000 mg | ORAL_CAPSULE | Freq: Two times a day (BID) | ORAL | 0 refills | Status: AC
Start: 2021-03-12 — End: 2021-03-17

## 2021-03-12 NOTE — ED Triage Notes (Signed)
Pt presents with urinary urgency, back pain, dysuria, and vaginal discharge x 2 weeks.

## 2021-03-12 NOTE — Discharge Instructions (Addendum)
Take the antibiotic as directed.  The urine culture is pending.  We will call you if it shows the need to change or discontinue your antibiotic.    Your vaginal tests are pending. Schedule an appointment with your gynecologist as soon as possible for vaginal bleeding.     Your blood pressure is elevated today at 173/83.  Please have this rechecked by your primary care provider in 2-4 weeks.

## 2021-03-12 NOTE — ED Provider Notes (Signed)
Roderic Palau    CSN: [id number] Arrival date & time: 03/12/21  4970      History   Chief Complaint Chief Complaint  Patient presents with   UTI Symptoms     HPI Anna [name] is a 75 y.o. female.  Patient presents with 2-week history of dysuria, urinary frequency and urgency, low back pain, white yellow vaginal discharge.  No fever, chills, abdominal pain, pelvic pain, flank pain, or other symptoms.  She denies sexual activity.  Her medical history includes hypertension, Wolff-Parkinson-White syndrome, diabetes, arthritis, anxiety, insomnia, basal cell carcinoma.  The history is provided by the patient and medical records.   Past Medical History:  Diagnosis Date   Acid reflux    Anxiety    Bilateral cataracts    CIN I (cervical intraepithelial neoplasia I)    Colon polyps    benign   Hemorrhoid    History of basal cell carcinoma (BCC) excision    History of gallstones    History of melanoma excision RIGHT LEG  --  2012   History of Wolff-Parkinson-White (WPW) syndrome    1997  s/p  ablation   Lyme disease    Mild carotid artery disease ([place])    per duplex 12-10-2012  bilateral ICA 1-39% stenosis   SVT (supraventricular tachycardia) ([place]) 10/31/2014    Patient Active Problem List   Diagnosis Date Noted   OSA (obstructive sleep apnea) 11/09/2020   Edema 09/20/2020   Essential hypertension 09/20/2020   B12 deficiency 06/24/2020   Vitamin D deficiency 06/24/2020   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   Multiple food allergies 06/20/2020   Intestinal malabsorption 06/20/2020   Irritable bowel syndrome with constipation 01/04/2020   Prolapsed internal hemorrhoids, grade 4 01/04/2020   Internal and external bleeding hemorrhoids 01/04/2020   Melanoma ([place]) 03/22/2019   Heart palpitations 03/22/2019   Hyperlipidemia 04/27/2014   Chronic osteoarthritis 04/27/2014   Chest pain 12/10/2012   Blurred vision, bilateral 12/10/2012   Hematuria  12/10/2012   Anxiety attack 12/10/2012    Past Surgical History:  Procedure Laterality Date   BREAST SURGERY Left 1999   BREAST MASS EXCISED, benign   CARDIAC ELECTROPHYSIOLOGY STUDY AND ABLATION  1997   SUCCESSFUL ABLATION OF WOLFE-PARKINSON-WHITE SYNDROME  (NO ISSUES SINCE)   CATARACT EXTRACTION, BILATERAL     CERVICAL BIOPSY  W/ LOOP ELECTRODE EXCISION  2005   dr gottsegen   COLONOSCOPY     COLPOSCOPY     HYSTEROSCOPY WITH D & C  02/13/2012   Procedure: DILATATION AND CURETTAGE /HYSTEROSCOPY;  Surgeon: Bennetta Laos, MD;  Location: [facility];  Service: Gynecology;  Laterality: N/A;  deficit - 710   LAPAROSCOPIC CHOLECYSTECTOMY  05-16-2005  dr Brantley Stage [facility]   melanoma excised     SVT-CARDIAC ABLATION     TRANSTHORACIC ECHOCARDIOGRAM  12-10-2012   dr Wynonia Lawman   mild LVH, ef 55-60%/  mild MR/  mild LAE and RAE   TUBAL LIGATION     UPPER GI ENDOSCOPY     VULVECTOMY N/A 07/25/2017   Procedure: WIDE EXCISION VULVECTOMY;  Surgeon: Marti Sleigh, MD;  Location: [facility];  Service: Gynecology;  Laterality: N/A;    OB History     Gravida  2   Para  2   Term  2   Preterm      AB      Living  2      SAB      IAB  Ectopic      Multiple      Live Births               Home Medications    Prior to Admission medications   Medication Sig Start Date End Date Taking? Authorizing Provider  ALPRAZolam Duanne Moron) 0.5 MG tablet TAKE 1 TABLET BY MOUTH EVERY DAY AT BEDTIME AS NEEDED 11/21/20   Crecencio Mc, MD  Beta Carotene (VITAMIN A) 25000 UNIT capsule Take 1 capsule (25,000 Units total) by mouth daily. 07/04/20   Crecencio Mc, MD  cephALEXin (KEFLEX) 500 MG capsule Take 1 capsule (500 mg total) by mouth 2 (two) times daily for 5 days. 03/12/21 03/17/21 Yes Sharion Balloon, NP  cetirizine (ZYRTEC) 10 MG tablet Take 10 mg by mouth daily.    [provider]  conjugated estrogens (PREMARIN) vaginal cream Estrogen  Cream Instruction Discard applicator Apply pea sized amount to tip of finger to urethra before bed. Wash hands well after application. Use Monday, Wednesday and Friday 06/21/20   Hollice Espy, MD  cromolyn (GASTROCROM) 100 MG/5ML solution Take 200 mg by mouth 4 (four) times daily as needed. 06/21/20   [provider]  EPINEPHrine 0.3 mg/0.3 mL IJ SOAJ injection Use as directed for severe allergic reaction 06/10/18   Kennith Gain, MD  ergocalciferol (DRISDOL) 1.25 MG (50000 UT) capsule Take 1 capsule (50,000 Units total) by mouth once a week. 06/24/20   Crecencio Mc, MD  estradiol (ESTRACE) 0.1 MG/GM vaginal cream Place 1 Applicatorful vaginally at bedtime. 06/21/20   Hollice Espy, MD  fluticasone [facility]) 50 MCG/ACT nasal spray Place into both nostrils daily.    [provider]  hydrocortisone (ANUSOL-HC) 2.5 % rectal cream SMARTSIG:1 Topical Every Night 07/15/20   [provider]  hydrocortisone 2.5 % cream Apply topically 2 (two) times daily. Apply to hemorrhoid area 07/19/19   Joseph Pierini, MD  losartan-hydrochlorothiazide [facility]) 50-12.5 MG tablet TAKE 1 TABLET BY MOUTH EVERY DAY 02/13/21   Crecencio Mc, MD    Family History Family History  Problem Relation Age of Onset   Cancer Mother        ESOPHAGEAL CANCER   Diabetes Father    Hyperlipidemia Father    Heart disease Father    Heart attack Father    Heart disease Brother    Breast cancer Paternal Aunt        Age 78's   Hyperlipidemia Paternal Grandmother    Heart disease Paternal Grandmother    Stroke Paternal Grandmother    Diabetes Paternal Grandfather    Hyperlipidemia Paternal Grandfather    Heart disease Paternal Grandfather    Stroke Paternal Grandfather    Cancer Sister        skin   Stroke Maternal Grandmother    Stroke Maternal Grandfather    Diabetes Brother     Social History Social History   Tobacco Use   Smoking status: Never   Smokeless tobacco: Never   Vaping Use   Vaping Use: Never used  Substance Use Topics   Alcohol use: Yes    Alcohol/week: 0.0 standard drinks    Comment: rare   Drug use: No     Allergies   Iodinated diagnostic agents, Alpha-gal, Gluten meal, Milk-related compounds, Shellfish allergy, Sulfa antibiotics, Azithromycin, Other, and Wheat bran   Review of Systems Review of Systems  Constitutional:  Negative for chills and fever.  Respiratory:  Negative for cough and shortness of breath.   Cardiovascular:  Negative for chest pain and palpitations.  Gastrointestinal:  Negative for abdominal pain and vomiting.  Genitourinary:  Positive for dysuria, frequency, urgency and vaginal discharge. Negative for flank pain, hematuria and pelvic pain.  Musculoskeletal:  Positive for back pain. Negative for gait problem.  Skin:  Negative for color change and rash.  All other systems reviewed and are negative.   Physical Exam Triage Vital Signs ED Triage Vitals  Enc Vitals Group     BP      Pulse      Resp      Temp      Temp src      SpO2      Weight      Height      Head Circumference      Peak Flow      Pain Score      Pain Loc      Pain Edu?      Excl. in [place]?    No data found.  Updated Vital Signs BP (!) 173/83 (BP Location: Left Arm)   Pulse 83   Temp 97.7 F (36.5 C) (Oral)   Resp 18   SpO2 95%   Visual Acuity Right Eye Distance:   Left Eye Distance:   Bilateral Distance:    Right Eye Near:   Left Eye Near:    Bilateral Near:     Physical Exam Vitals and nursing note reviewed. Exam conducted with a chaperone present Kristine Garbe, Therapist, sports).  Constitutional:      General: She is not in acute distress.    Appearance: She is well-developed. She is not ill-appearing.  HENT:     Mouth/Throat:     Mouth: Mucous membranes are moist.  Cardiovascular:     Rate and Rhythm: Normal rate and regular rhythm.     Heart sounds: Normal heart sounds.  Pulmonary:     Effort: Pulmonary effort is normal. No  respiratory distress.     Breath sounds: Normal breath sounds.  Abdominal:     General: Bowel sounds are normal.     Palpations: Abdomen is soft.     Tenderness: There is no abdominal tenderness. There is no right CVA tenderness, left CVA tenderness, guarding or rebound.  Genitourinary:    Exam position: Lithotomy position.     Vagina: Bleeding present. No tenderness.     Adnexa:        Right: No tenderness.         Left: No tenderness.       Comments: Scant amount of blood in vagina near cervix. Musculoskeletal:     Cervical back: Neck supple.  Skin:    General: Skin is warm and dry.  Neurological:     Mental Status: She is alert.  Psychiatric:        Mood and Affect: Mood normal.        Behavior: Behavior normal.     UC Treatments / Results  Labs (all labs ordered are listed, but only abnormal results are displayed) Labs Reviewed  POCT URINALYSIS DIP (MANUAL ENTRY) - Abnormal; Notable for the following components:      Result Value   Clarity, UA cloudy (*)    Nitrite, UA Positive (*)    Leukocytes, UA Moderate (2+) (*)    All other components within normal limits  URINE CULTURE  CERVICOVAGINAL ANCILLARY ONLY    EKG   Radiology No results found.  Procedures Procedures (including critical care time)  Medications Ordered in UC  Medications - No data to display  Initial Impression / Assessment and Plan / UC Course  I have reviewed the triage vital signs and the nursing notes.  Pertinent labs & imaging results that were available during my care of the patient were reviewed by me and considered in my medical decision making (see chart for details).    UTI with hematuria.  Abnormal vaginal bleeding postmenopausal patient.  Elevated blood pressure reading with HTN.  Treating with Keflex. Urine culture pending. Discussed with patient that we will call her if the urine culture shows the need to change or discontinue the antibiotic.  Vaginal swab obtained for testing.   Patient has vaginal blood near cervix.  No uterine or adnexal tenderness on exam.  Instructed patient to schedule the soonest available appointment with her PCP or GYN.  Education provided on postmenopausal bleeding. Also discussed that her blood pressure is elevated today and needs to be rechecked by her PCP in 2 to 4 weeks.  Education provided on managing hypertension. Patient agrees to plan of care.   Final Clinical Impressions(s) / UC Diagnoses   Final diagnoses:  Urinary tract infection with hematuria, site unspecified  Abnormal vaginal bleeding in postmenopausal patient  Elevated blood pressure reading in office with diagnosis of hypertension     Discharge Instructions      Take the antibiotic as directed.  The urine culture is pending.  We will call you if it shows the need to change or discontinue your antibiotic.    Your vaginal tests are pending. Schedule an appointment with your gynecologist as soon as possible for vaginal bleeding.     Your blood pressure is elevated today at 173/83.  Please have this rechecked by your primary care provider in 2-4 weeks.          ED Prescriptions     Medication Sig Dispense Auth. Provider   cephALEXin (KEFLEX) 500 MG capsule Take 1 capsule (500 mg total) by mouth 2 (two) times daily for 5 days. 10 capsule Sharion Balloon, NP      PDMP not reviewed this encounter.   Sharion Balloon, NP 03/12/21 [phone]

## 2021-03-13 ENCOUNTER — Ambulatory Visit (INDEPENDENT_AMBULATORY_CARE_PROVIDER_SITE_OTHER): Payer: Medicare Other | Admitting: Obstetrics and Gynecology

## 2021-03-13 ENCOUNTER — Other Ambulatory Visit (HOSPITAL_COMMUNITY)
Admission: RE | Admit: 2021-03-13 | Discharge: 2021-03-13 | Disposition: A | Discharge status: Home / Self Care | Payer: Medicare Other | Source: Ambulatory Visit | Attending: Obstetrics and Gynecology | Admitting: Obstetrics and Gynecology

## 2021-03-13 ENCOUNTER — Encounter: Payer: Self-pay | Admitting: Obstetrics and Gynecology

## 2021-03-13 VITALS — BP 114/78 | HR 72

## 2021-03-13 DIAGNOSIS — Z860100 Personal history of colon polyps, unspecified: Secondary | ICD-10-CM | POA: Insufficient documentation

## 2021-03-13 DIAGNOSIS — N888 Other specified noninflammatory disorders of cervix uteri: Secondary | ICD-10-CM

## 2021-03-13 DIAGNOSIS — Z01419 Encounter for gynecological examination (general) (routine) without abnormal findings: Secondary | ICD-10-CM | POA: Diagnosis present

## 2021-03-13 DIAGNOSIS — Z8619 Personal history of other infectious and parasitic diseases: Secondary | ICD-10-CM

## 2021-03-13 DIAGNOSIS — C189 Malignant neoplasm of colon, unspecified: Secondary | ICD-10-CM | POA: Insufficient documentation

## 2021-03-13 DIAGNOSIS — N950 Postmenopausal bleeding: Secondary | ICD-10-CM

## 2021-03-13 DIAGNOSIS — K625 Hemorrhage of anus and rectum: Secondary | ICD-10-CM | POA: Insufficient documentation

## 2021-03-13 DIAGNOSIS — Z1151 Encounter for screening for human papillomavirus (HPV): Secondary | ICD-10-CM | POA: Insufficient documentation

## 2021-03-13 DIAGNOSIS — R9389 Abnormal findings on diagnostic imaging of other specified body structures: Secondary | ICD-10-CM | POA: Insufficient documentation

## 2021-03-13 DIAGNOSIS — Z124 Encounter for screening for malignant neoplasm of cervix: Secondary | ICD-10-CM

## 2021-03-13 DIAGNOSIS — K219 Gastro-esophageal reflux disease without esophagitis: Secondary | ICD-10-CM | POA: Insufficient documentation

## 2021-03-13 DIAGNOSIS — T7840XA Allergy, unspecified, initial encounter: Secondary | ICD-10-CM | POA: Insufficient documentation

## 2021-03-13 DIAGNOSIS — K921 Melena: Secondary | ICD-10-CM | POA: Insufficient documentation

## 2021-03-13 LAB — CERVICOVAGINAL ANCILLARY ONLY
Bacterial Vaginitis (gardnerella): NEGATIVE
Candida Glabrata: NEGATIVE
Candida Vaginitis: NEGATIVE
Chlamydia: NEGATIVE
Comment: NEGATIVE
Comment: NEGATIVE
Comment: NEGATIVE
Comment: NEGATIVE
Comment: NEGATIVE
Comment: NORMAL
Neisseria Gonorrhea: NEGATIVE
Trichomonas: NEGATIVE

## 2021-03-13 NOTE — Progress Notes (Signed)
GYNECOLOGY  VISIT   HPI: 74 y.o.   Divorced White or Caucasian Not Hispanic or Latino  female   [phone] with No LMP recorded. Patient is postmenopausal.   here for post menopausal bleeding.  She was seen in the ER yesterday with a UTI with hematuria. On pelvic exam a scant amount of blood was noted in her vagina near her cervix.  She hadn't noticed vaginal bleeding prior to the visit yesterday, no bleeding sine.  She started antibiotics yesterday. Not feeling better yet. No fever.   Not sexually active for 20 years. Ex husband cheated on her and gave her HPV.   03/12/21 labs: Negative GC/CT/trich/BV/yeast Urine >100,000 e coli.   Last pap on 05/02/17: negative, no hpv testing done.   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 Patient is postmenopausal. Contraception:pmp Menopausal hormone therapy: yes estrace cream         OB History     Gravida  2   Para  2   Term  2   Preterm      AB      Living  2      SAB      IAB      Ectopic      Multiple      Live Births                 Patient Active Problem List   Diagnosis Date Noted   Abnormal findings on diagnostic imaging of other specified body structures 03/13/2021   Gastroesophageal reflux disease 03/13/2021   Hematochezia 03/13/2021   Personal history of colonic polyps 03/13/2021   Rectal bleeding 03/13/2021   Allergic reaction 03/13/2021   OSA (obstructive sleep apnea) 11/09/2020   Edema 09/20/2020   Essential hypertension 09/20/2020   B12 deficiency 06/24/2020   Vitamin D deficiency 06/24/2020   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   Multiple food allergies 06/20/2020   Intestinal malabsorption 06/20/2020   Irritable bowel syndrome with constipation 01/04/2020   Prolapsed internal hemorrhoids, grade 4 01/04/2020   Internal and external bleeding hemorrhoids 01/04/2020   Melanoma ([place]) 03/22/2019   Heart palpitations 03/22/2019    Hyperlipidemia 04/27/2014   Chronic osteoarthritis 04/27/2014   Chest pain 12/10/2012   Blurred vision, bilateral 12/10/2012   Hematuria 12/10/2012   Anxiety attack 12/10/2012    Past Medical History:  Diagnosis Date   Acid reflux    Allergy to alpha-gal    Anxiety    Bilateral cataracts    CIN I (cervical intraepithelial neoplasia I)    Colon polyps    benign   Hemorrhoid    History of basal cell carcinoma (BCC) excision    History of gallstones    History of melanoma excision RIGHT LEG  --  2012   History of Wolff-Parkinson-White (WPW) syndrome    1997  s/p  ablation   Lyme disease    Mild carotid artery disease ([place])    per duplex 12-10-2012  bilateral ICA 1-39% stenosis   SVT (supraventricular tachycardia) ([place]) 10/31/2014    Past Surgical History:  Procedure Laterality Date   BREAST SURGERY Left 1999   BREAST MASS EXCISED, benign   CARDIAC ELECTROPHYSIOLOGY STUDY AND ABLATION  1997   SUCCESSFUL ABLATION OF WOLFE-PARKINSON-WHITE SYNDROME  (NO ISSUES SINCE)   CATARACT EXTRACTION, BILATERAL     CERVICAL BIOPSY  W/ LOOP ELECTRODE EXCISION  2005   dr gottsegen   COLONOSCOPY  COLPOSCOPY     HYSTEROSCOPY WITH D & C  02/13/2012   Procedure: DILATATION AND CURETTAGE /HYSTEROSCOPY;  Surgeon: Bennetta Laos, MD;  Location: [place];  Service: Gynecology;  Laterality: N/A;  deficit - 710   LAPAROSCOPIC CHOLECYSTECTOMY  05-16-2005  dr Brantley Stage [facility]   melanoma excised     SVT-CARDIAC ABLATION     TRANSTHORACIC ECHOCARDIOGRAM  12-10-2012   dr Wynonia Lawman   mild LVH, ef 55-60%/  mild MR/  mild LAE and RAE   TUBAL LIGATION     UPPER GI ENDOSCOPY     VULVECTOMY N/A 07/25/2017   Procedure: WIDE EXCISION VULVECTOMY;  Surgeon: Marti Sleigh, MD;  Location: [facility];  Service: Gynecology;  Laterality: N/A;    Current Outpatient Medications  Medication Sig Dispense Refill   ALPRAZolam (XANAX) 0.5 MG tablet TAKE 1 TABLET BY MOUTH EVERY  DAY AT BEDTIME AS NEEDED 30 tablet 4   cephALEXin (KEFLEX) 500 MG capsule Take 1 capsule (500 mg total) by mouth 2 (two) times daily for 5 days. 10 capsule 0   cetirizine (ZYRTEC) 10 MG tablet Take 10 mg by mouth daily.     conjugated estrogens (PREMARIN) vaginal cream Estrogen Cream Instruction Discard applicator Apply pea sized amount to tip of finger to urethra before bed. Wash hands well after application. Use Monday, Wednesday and Friday 42.5 g 12   cromolyn (GASTROCROM) 100 MG/5ML solution Take 200 mg by mouth 4 (four) times daily as needed.     Cyanocobalamin (VITAMIN B-12 PO) Take by mouth.     EPINEPHrine 0.3 mg/0.3 mL IJ SOAJ injection Use as directed for severe allergic reaction 2 Device 2   estradiol (ESTRACE) 0.1 MG/GM vaginal cream Place 1 Applicatorful vaginally at bedtime. 42.5 g 12   fluticasone (FLONASE) 50 MCG/ACT nasal spray Place into both nostrils daily.     hydrocortisone (ANUSOL-HC) 2.5 % rectal cream SMARTSIG:1 Topical Every Night     losartan-hydrochlorothiazide (HYZAAR) 50-12.5 MG tablet TAKE 1 TABLET BY MOUTH EVERY DAY 90 tablet 1   VITAMIN A PO Take by mouth.     VITAMIN D PO Take by mouth.     No current facility-administered medications for this visit.     ALLERGIES: Iodinated diagnostic agents, Alpha-gal, Gluten meal, Milk-related compounds, Shellfish allergy, Sulfa antibiotics, Azithromycin, Other, and Wheat bran  Family History  Problem Relation Age of Onset   Cancer Mother        ESOPHAGEAL CANCER   Diabetes Father    Hyperlipidemia Father    Heart disease Father    Heart attack Father    Heart disease Brother    Breast cancer Paternal Aunt        Age 39's   Hyperlipidemia Paternal Grandmother    Heart disease Paternal Grandmother    Stroke Paternal Grandmother    Diabetes Paternal Grandfather    Hyperlipidemia Paternal Grandfather    Heart disease Paternal Grandfather    Stroke Paternal Grandfather    Cancer Sister        skin   Stroke  Maternal Grandmother    Stroke Maternal Grandfather    Diabetes Brother     Social History   Socioeconomic History   Marital status: Divorced    Spouse name: Not on file   Number of children: 2   Years of education: Not on file   Highest education level: Not on file  Occupational History   Not on file  Tobacco Use   Smoking status:  Never   Smokeless tobacco: Never  Vaping Use   Vaping Use: Never used  Substance and Sexual Activity   Alcohol use: Yes    Alcohol/week: 0.0 standard drinks    Comment: rare   Drug use: No   Sexual activity: Not Currently    Birth control/protection: Surgical    Comment: 1st intercourse 75 yo--Fewer than 5 partners  Other Topics Concern   Not on file  Social History Narrative   Not on file   Social Determinants of Health   Financial Resource Strain: Not on file  Food Insecurity: Not on file  Transportation Needs: Not on file  Physical Activity: Not on file  Stress: Not on file  Social Connections: Not on file  Intimate Partner Violence: Not on file    ROS  PHYSICAL EXAMINATION:    BP 114/78    Pulse 72    SpO2 97%     General appearance: alert, cooperative and appears stated age Abdomen: soft, mildly tender in her lower abdomen, no rebound, no guarding; non distended, no masses,  no organomegaly  Pelvic: External genitalia:  no lesions              Urethra:  normal appearing urethra with no masses, tenderness or lesions              Bartholins and Skenes: normal                 Vagina: atrophic appearing vagina with normal color and discharge, no lesions. In the upper vaginal fornix on the left it looks slightly friable.              Cervix: no lesions and friable appearing              Bimanual Exam:  Uterus:   no masses, no tenderness              Adnexa: no mass, fullness, tenderness              Rectovaginal: Yes.  .  Confirms.              Anus:  normal sphincter tone, no lesions  Chaperone was present for exam.  1.  Postmenopausal bleeding Suspect the bleeding could be from atrophy of the vagina and cervix, both appear slightly friable - Cytology - PAP - US PELVIS TRANSVAGINAL NON-OB (TV ONLY); Future  2. Friable cervix See above - Cytology - PAP  3. History of HPV infection Needs to continue to have paps, h/o leep in 2005.  - Cytology - PAP  4. Screening for cervical cancer - Cytology - PAP with hpv

## 2021-03-14 ENCOUNTER — Encounter: Payer: Self-pay | Admitting: Obstetrics and Gynecology

## 2021-03-14 LAB — URINE CULTURE: Culture: >=100000 — AB

## 2021-03-15 LAB — CYTOLOGY - PAP
Comment: NEGATIVE
Diagnosis: NEGATIVE
High risk HPV: NEGATIVE

## 2021-03-20 ENCOUNTER — Other Ambulatory Visit: Payer: Self-pay

## 2021-03-20 ENCOUNTER — Ambulatory Visit (INDEPENDENT_AMBULATORY_CARE_PROVIDER_SITE_OTHER): Payer: Medicare Other | Admitting: Obstetrics and Gynecology

## 2021-03-20 ENCOUNTER — Other Ambulatory Visit (HOSPITAL_COMMUNITY)
Admission: RE | Admit: 2021-03-20 | Discharge: 2021-03-20 | Disposition: A | Discharge status: Home / Self Care | Payer: Medicare Other | Source: Ambulatory Visit | Attending: Obstetrics and Gynecology | Admitting: Obstetrics and Gynecology

## 2021-03-20 ENCOUNTER — Ambulatory Visit (INDEPENDENT_AMBULATORY_CARE_PROVIDER_SITE_OTHER): Payer: Medicare Other

## 2021-03-20 ENCOUNTER — Encounter: Payer: Self-pay | Admitting: Obstetrics and Gynecology

## 2021-03-20 VITALS — BP 122/64 | HR 70 | Ht 68.0 in | Wt 208.0 lb

## 2021-03-20 DIAGNOSIS — N950 Postmenopausal bleeding: Secondary | ICD-10-CM

## 2021-03-20 DIAGNOSIS — R9389 Abnormal findings on diagnostic imaging of other specified body structures: Secondary | ICD-10-CM | POA: Insufficient documentation

## 2021-03-20 NOTE — Progress Notes (Signed)
GYNECOLOGY  VISIT   HPI: 75 y.o.   Divorced White or Caucasian Not Hispanic or Latino  female   [phone] with No LMP recorded. Patient is postmenopausal.   here for ultrasound consult for PMP bleeding. She was noted to have atrophy and some friability of both the cervix and the vagina.  Pap from last week is negative.   GYNECOLOGIC HISTORY: No LMP recorded. Patient is postmenopausal. Contraception: pmp  Menopausal hormone therapy: estrace vaginal cream        OB History     Gravida  2   Para  2   Term  2   Preterm      AB      Living  2      SAB      IAB      Ectopic      Multiple      Live Births                 Patient Active Problem List   Diagnosis Date Noted   Abnormal findings on diagnostic imaging of other specified body structures 03/13/2021   Gastroesophageal reflux disease 03/13/2021   Hematochezia 03/13/2021   Personal history of colonic polyps 03/13/2021   Rectal bleeding 03/13/2021   Allergic reaction 03/13/2021   OSA (obstructive sleep apnea) 11/09/2020   Edema 09/20/2020   Essential hypertension 09/20/2020   B12 deficiency 06/24/2020   Vitamin D deficiency 06/24/2020   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   Multiple food allergies 06/20/2020   Intestinal malabsorption 06/20/2020   Irritable bowel syndrome with constipation 01/04/2020   Prolapsed internal hemorrhoids, grade 4 01/04/2020   Internal and external bleeding hemorrhoids 01/04/2020   Melanoma ([place]) 03/22/2019   Heart palpitations 03/22/2019   Hyperlipidemia 04/27/2014   Chronic osteoarthritis 04/27/2014   Chest pain 12/10/2012   Blurred vision, bilateral 12/10/2012   Hematuria 12/10/2012   Anxiety attack 12/10/2012    Past Medical History:  Diagnosis Date   Acid reflux    Allergy to alpha-gal    Anxiety    Bilateral cataracts    CIN I (cervical intraepithelial neoplasia I)    Colon polyps    benign   Hemorrhoid    History of basal cell carcinoma (BCC)  excision    History of gallstones    History of melanoma excision RIGHT LEG  --  2012   History of Wolff-Parkinson-White (WPW) syndrome    1997  s/p  ablation   Lyme disease    Mild carotid artery disease ([place])    per duplex 12-10-2012  bilateral ICA 1-39% stenosis   SVT (supraventricular tachycardia) ([place]) 10/31/2014    Past Surgical History:  Procedure Laterality Date   BREAST SURGERY Left 1999   BREAST MASS EXCISED, benign   CARDIAC ELECTROPHYSIOLOGY STUDY AND ABLATION  1997   SUCCESSFUL ABLATION OF WOLFE-PARKINSON-WHITE SYNDROME  (NO ISSUES SINCE)   CATARACT EXTRACTION, BILATERAL     CERVICAL BIOPSY  W/ LOOP ELECTRODE EXCISION  2005   dr gottsegen   COLONOSCOPY     COLPOSCOPY     HYSTEROSCOPY WITH D & C  02/13/2012   Procedure: DILATATION AND CURETTAGE /HYSTEROSCOPY;  Surgeon: Bennetta Laos, MD;  Location: [facility];  Service: Gynecology;  Laterality: N/A;  deficit - 710   LAPAROSCOPIC CHOLECYSTECTOMY  05-16-2005  dr Brantley Stage [facility]   melanoma excised     SVT-CARDIAC ABLATION     TRANSTHORACIC ECHOCARDIOGRAM  12-10-2012   dr Wynonia Lawman  mild LVH, ef 55-60%/  mild MR/  mild LAE and RAE   TUBAL LIGATION     UPPER GI ENDOSCOPY     VULVECTOMY N/A 07/25/2017   Procedure: WIDE EXCISION VULVECTOMY;  Surgeon: Marti Sleigh, MD;  Location: [facility];  Service: Gynecology;  Laterality: N/A;    Current Outpatient Medications  Medication Sig Dispense Refill   ALPRAZolam (XANAX) 0.5 MG tablet TAKE 1 TABLET BY MOUTH EVERY DAY AT BEDTIME AS NEEDED 30 tablet 4   cetirizine (ZYRTEC) 10 MG tablet Take 10 mg by mouth daily.     conjugated estrogens (PREMARIN) vaginal cream Estrogen Cream Instruction Discard applicator Apply pea sized amount to tip of finger to urethra before bed. Wash hands well after application. Use Monday, Wednesday and Friday 42.5 g 12   cromolyn (GASTROCROM) 100 MG/5ML solution Take 200 mg by mouth 4 (four) times daily as needed.      Cyanocobalamin (VITAMIN B-12 PO) Take by mouth.     EPINEPHrine 0.3 mg/0.3 mL IJ SOAJ injection Use as directed for severe allergic reaction 2 Device 2   estradiol (ESTRACE) 0.1 MG/GM vaginal cream Place 1 Applicatorful vaginally at bedtime. 42.5 g 12   fluticasone (FLONASE) 50 MCG/ACT nasal spray Place into both nostrils daily.     hydrocortisone (ANUSOL-HC) 2.5 % rectal cream SMARTSIG:1 Topical Every Night     losartan-hydrochlorothiazide (HYZAAR) 50-12.5 MG tablet TAKE 1 TABLET BY MOUTH EVERY DAY 90 tablet 1   VITAMIN A PO Take by mouth.     VITAMIN D PO Take by mouth.     No current facility-administered medications for this visit.     ALLERGIES: Iodinated diagnostic agents, Alpha-gal, Gluten meal, Milk-related compounds, Shellfish allergy, Sulfa antibiotics, Azithromycin, Other, and Wheat bran  Family History  Problem Relation Age of Onset   Cancer Mother        ESOPHAGEAL CANCER   Diabetes Father    Hyperlipidemia Father    Heart disease Father    Heart attack Father    Heart disease Brother    Breast cancer Paternal Aunt        Age 68's   Hyperlipidemia Paternal Grandmother    Heart disease Paternal Grandmother    Stroke Paternal Grandmother    Diabetes Paternal Grandfather    Hyperlipidemia Paternal Grandfather    Heart disease Paternal Grandfather    Stroke Paternal Grandfather    Cancer Sister        skin   Stroke Maternal Grandmother    Stroke Maternal Grandfather    Diabetes Brother     Social History   Socioeconomic History   Marital status: Divorced    Spouse name: Not on file   Number of children: 2   Years of education: Not on file   Highest education level: Not on file  Occupational History   Not on file  Tobacco Use   Smoking status: Never   Smokeless tobacco: Never  Vaping Use   Vaping Use: Never used  Substance and Sexual Activity   Alcohol use: Yes    Alcohol/week: 0.0 standard drinks    Comment: rare   Drug use: No   Sexual  activity: Not Currently    Birth control/protection: Surgical    Comment: 1st intercourse 75 yo--Fewer than 5 partners  Other Topics Concern   Not on file  Social History Narrative   Not on file   Social Determinants of Health   Financial Resource Strain: Not on file  Food Insecurity:  Not on file  Transportation Needs: Not on file  Physical Activity: Not on file  Stress: Not on file  Social Connections: Not on file  Intimate Partner Violence: Not on file    Review of Systems  All other systems reviewed and are negative.  Pelvic ultrasound  Indications: Postmenopausal bleeding  Findings:  Uterus 5.07 x 3.36 x 2.72 xm  Endometrium 6.17-8.6 mm, avascular  Left ovary 1.24 x 0.95 x 1.03 cm  Right ovary 1.51 x 0.80 x 0.85 cm  No free fluid  Impression:  Small anteverted uterus Thickened endometrium, avascular Atrophic appearing ovaries bilaterally  PHYSICAL EXAMINATION:    BP 122/64    Pulse 70    Ht 5\' 8"  (1.727 m)    Wt 208 lb (94.3 kg)    SpO2 98%    BMI 31.63 kg/m     General appearance: alert, cooperative and appears stated age  Pelvic: External genitalia:  no lesions              Urethra:  normal appearing urethra with no masses, tenderness or lesions              Bartholins and Skenes: normal                 Vagina: normal appearing vagina with normal color and discharge, no lesions              Cervix: no lesions              The risks of endometrial biopsy were reviewed and a consent was obtained.  A speculum was placed in the vagina and the cervix was cleansed with Hibiclens. A tenaculum was placed on the cervix and the pipelle was placed into the endometrial cavity. The uterus sounded to 7 cm. The endometrial biopsy was performed, taking care to get a representative sample, sampling 360 degrees of the uterine cavity. Minimal tissue was obtained. The tenaculum and speculum were removed. There were no complications.    Chaperone was present for  exam.  1. Postmenopausal bleeding Thickened lining. Reviewed the images with the patient. Discussed that the lining can be diffusely thickened or the thickening ca be from a polyp. Discussed that endometrial biopsies are good at diagnosing a generalized endometrial issue, but can miss polyps. Discussed the option of endometrial biopsy or proceeding with hysteroscopy. She would like to do the biopsy today. She understands that depending on the biopsy results she may still need a hysteroscopy. - Surgical pathology( [place]/ [facility])  2. Thickened endometrium - Surgical pathology( [place]/ [facility])

## 2021-03-20 NOTE — Patient Instructions (Signed)

## 2021-03-22 LAB — SURGICAL PATHOLOGY

## 2021-04-06 ENCOUNTER — Encounter: Payer: Self-pay | Admitting: Obstetrics and Gynecology

## 2021-04-06 ENCOUNTER — Other Ambulatory Visit: Payer: Self-pay

## 2021-04-06 ENCOUNTER — Ambulatory Visit (INDEPENDENT_AMBULATORY_CARE_PROVIDER_SITE_OTHER): Payer: Medicare Other | Admitting: Obstetrics and Gynecology

## 2021-04-06 VITALS — BP 154/78 | HR 71 | Ht 68.0 in | Wt 208.0 lb

## 2021-04-06 DIAGNOSIS — B3731 Acute candidiasis of vulva and vagina: Secondary | ICD-10-CM

## 2021-04-06 DIAGNOSIS — R300 Dysuria: Secondary | ICD-10-CM

## 2021-04-06 DIAGNOSIS — N950 Postmenopausal bleeding: Secondary | ICD-10-CM | POA: Diagnosis not present

## 2021-04-06 DIAGNOSIS — N952 Postmenopausal atrophic vaginitis: Secondary | ICD-10-CM

## 2021-04-06 DIAGNOSIS — R9389 Abnormal findings on diagnostic imaging of other specified body structures: Secondary | ICD-10-CM

## 2021-04-06 DIAGNOSIS — N763 Subacute and chronic vulvitis: Secondary | ICD-10-CM | POA: Diagnosis not present

## 2021-04-06 LAB — WET PREP FOR TRICH, YEAST, CLUE

## 2021-04-06 MED ORDER — BETAMETHASONE VALERATE 0.1 % EX OINT: 1.0000 "application " | TOPICAL_OINTMENT | Freq: Two times a day (BID) | CUTANEOUS | 0 refills | Status: DC

## 2021-04-06 MED ORDER — ESTRADIOL 0.1 MG/GM VA CREA: TOPICAL_CREAM | VAGINAL | 1 refills | Status: DC

## 2021-04-06 MED ORDER — FLUCONAZOLE 150 MG PO TABS: 150.0000 mg | ORAL_TABLET | Freq: Once | ORAL | 0 refills | Status: AC

## 2021-04-06 NOTE — Progress Notes (Signed)
GYNECOLOGY  VISIT   HPI: 76 y.o.   Divorced White or Caucasian Not Hispanic or Latino  female   [phone] with No LMP recorded. Patient is postmenopausal.   here to discuss EMB results.  The patient was seen last month for PMP bleeding. She was noted to have vaginal atrophy and some friability of both the cervix and the vagina. An ultrasound was done and her endometrial stripe measured between 6.17-8.6 mm, it was avascular.  ENDOMETRIUM, BIOPSY:  -  Inactive endometrium (fragmented strips of epithelium with few intact  inactive glands).  -  Negative for endometrial intraepithelial neoplasia (EIN) and  malignancy.  -  Squamous and endocervical fragments, negative for dysplasia.   She c/o constant vulvar irritation and itching for over a year. She has some burning with urination every time she voids. She isn't sure if the pain is external or in her urethra.  She has had some odor, she has some yellow d/c.   She saw Urology on 03/12/21 and had negative testing for GC/CT/Trich/BV/yeast She did have a UTI and was treated with antibiotics.   She has had abdominal bloating for the last year. BM go from diarrhea to constipation. Her bowels are definitely affected by her diet.  Prior to the bloating, she had a colonoscopy and had polyps removed.  She has had a pelvic ultrasound that showed normal ovaries bilaterally.  Not sexually active.   GYNECOLOGIC HISTORY: No LMP recorded. Patient is postmenopausal. Contraception:PMP Menopausal hormone therapy: Estrogen Cream        OB History     Gravida  2   Para  2   Term  2   Preterm      AB      Living  2      SAB      IAB      Ectopic      Multiple      Live Births                 Patient Active Problem List   Diagnosis Date Noted   Abnormal findings on diagnostic imaging of other specified body structures 03/13/2021   Gastroesophageal reflux disease 03/13/2021   Hematochezia 03/13/2021   Personal history of colonic  polyps 03/13/2021   Rectal bleeding 03/13/2021   Allergic reaction 03/13/2021   OSA (obstructive sleep apnea) 11/09/2020   Edema 09/20/2020   Essential hypertension 09/20/2020   B12 deficiency 06/24/2020   Vitamin D deficiency 06/24/2020   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   Multiple food allergies 06/20/2020   Intestinal malabsorption 06/20/2020   Irritable bowel syndrome with constipation 01/04/2020   Prolapsed internal hemorrhoids, grade 4 01/04/2020   Internal and external bleeding hemorrhoids 01/04/2020   Melanoma ([place]) 03/22/2019   Heart palpitations 03/22/2019   Hyperlipidemia 04/27/2014   Chronic osteoarthritis 04/27/2014   Chest pain 12/10/2012   Blurred vision, bilateral 12/10/2012   Hematuria 12/10/2012   Anxiety attack 12/10/2012    Past Medical History:  Diagnosis Date   Acid reflux    Allergy to alpha-gal    Anxiety    Bilateral cataracts    CIN I (cervical intraepithelial neoplasia I)    Colon polyps    benign   Hemorrhoid    History of basal cell carcinoma (BCC) excision    History of gallstones    History of melanoma excision RIGHT LEG  --  2012   History of Wolff-Parkinson-White (WPW) syndrome    1997  s/p  ablation   Lyme disease    Mild carotid artery disease ([place])    per duplex 12-10-2012  bilateral ICA 1-39% stenosis   SVT (supraventricular tachycardia) ([place]) 10/31/2014    Past Surgical History:  Procedure Laterality Date   BREAST SURGERY Left 1999   BREAST MASS EXCISED, benign   CARDIAC ELECTROPHYSIOLOGY STUDY AND ABLATION  1997   SUCCESSFUL ABLATION OF WOLFE-PARKINSON-WHITE SYNDROME  (NO ISSUES SINCE)   CATARACT EXTRACTION, BILATERAL     CERVICAL BIOPSY  W/ LOOP ELECTRODE EXCISION  2005   dr gottsegen   COLONOSCOPY     COLPOSCOPY     HYSTEROSCOPY WITH D & C  02/13/2012   Procedure: DILATATION AND CURETTAGE /HYSTEROSCOPY;  Surgeon: Bennetta Laos, MD;  Location: [place];  Service: Gynecology;  Laterality:  N/A;  deficit - 710   LAPAROSCOPIC CHOLECYSTECTOMY  05-16-2005  dr Brantley Stage [facility]   melanoma excised     SVT-CARDIAC ABLATION     TRANSTHORACIC ECHOCARDIOGRAM  12-10-2012   dr Wynonia Lawman   mild LVH, ef 55-60%/  mild MR/  mild LAE and RAE   TUBAL LIGATION     UPPER GI ENDOSCOPY     VULVECTOMY N/A 07/25/2017   Procedure: WIDE EXCISION VULVECTOMY;  Surgeon: Marti Sleigh, MD;  Location: [facility];  Service: Gynecology;  Laterality: N/A;    Current Outpatient Medications  Medication Sig Dispense Refill   ALPRAZolam (XANAX) 0.5 MG tablet TAKE 1 TABLET BY MOUTH EVERY DAY AT BEDTIME AS NEEDED 30 tablet 4   cetirizine (ZYRTEC) 10 MG tablet Take 10 mg by mouth daily.     conjugated estrogens (PREMARIN) vaginal cream Estrogen Cream Instruction Discard applicator Apply pea sized amount to tip of finger to urethra before bed. Wash hands well after application. Use Monday, Wednesday and Friday 42.5 g 12   cromolyn (GASTROCROM) 100 MG/5ML solution Take 200 mg by mouth 4 (four) times daily as needed.     Cyanocobalamin (VITAMIN B-12 PO) Take by mouth.     EPINEPHrine 0.3 mg/0.3 mL IJ SOAJ injection Use as directed for severe allergic reaction 2 Device 2   fluticasone (FLONASE) 50 MCG/ACT nasal spray Place into both nostrils daily.     hydrocortisone (ANUSOL-HC) 2.5 % rectal cream SMARTSIG:1 Topical Every Night     losartan-hydrochlorothiazide (HYZAAR) 50-12.5 MG tablet TAKE 1 TABLET BY MOUTH EVERY DAY 90 tablet 1   VITAMIN A PO Take by mouth.     VITAMIN D PO Take by mouth.     No current facility-administered medications for this visit.     ALLERGIES: Iodinated contrast media, Alpha-gal, Gluten meal, Milk-related compounds, Shellfish allergy, Sulfa antibiotics, Azithromycin, Other, and Wheat bran  Family History  Problem Relation Age of Onset   Cancer Mother        ESOPHAGEAL CANCER   Diabetes Father    Hyperlipidemia Father    Heart disease Father    Heart attack Father     Heart disease Brother    Breast cancer Paternal Aunt        Age 82's   Hyperlipidemia Paternal Grandmother    Heart disease Paternal Grandmother    Stroke Paternal Grandmother    Diabetes Paternal Grandfather    Hyperlipidemia Paternal Grandfather    Heart disease Paternal Grandfather    Stroke Paternal Grandfather    Cancer Sister        skin   Stroke Maternal Grandmother    Stroke Maternal Grandfather    Diabetes Brother  Social History   Socioeconomic History   Marital status: Divorced    Spouse name: Not on file   Number of children: 2   Years of education: Not on file   Highest education level: Not on file  Occupational History   Not on file  Tobacco Use   Smoking status: Never   Smokeless tobacco: Never  Vaping Use   Vaping Use: Never used  Substance and Sexual Activity   Alcohol use: Yes    Alcohol/week: 0.0 standard drinks    Comment: rare   Drug use: No   Sexual activity: Not Currently    Birth control/protection: Surgical    Comment: 1st intercourse 76 yo--Fewer than 5 partners  Other Topics Concern   Not on file  Social History Narrative   Not on file   Social Determinants of Health   Financial Resource Strain: Not on file  Food Insecurity: Not on file  Transportation Needs: Not on file  Physical Activity: Not on file  Stress: Not on file  Social Connections: Not on file  Intimate Partner Violence: Not on file    Review of Systems  Constitutional: Negative.   HENT: Negative.    Eyes: Negative.   Respiratory: Negative.    Cardiovascular: Negative.   Gastrointestinal: Negative.   Genitourinary: Negative.   Musculoskeletal: Negative.   Skin: Negative.   Neurological: Negative.   Endo/Heme/Allergies: Negative.   Psychiatric/Behavioral: Negative.     PHYSICAL EXAMINATION:    BP (!) 154/78    Pulse 71    Ht 5\' 8"  (1.727 m)    Wt 208 lb (94.3 kg)    SpO2 99%    BMI 31.63 kg/m     General appearance: alert, cooperative and appears  stated age  Pelvic: External genitalia:  no lesions, atrophic vulva, erythema and thinning of the skin. No whitening, no plaques, no fissures              Urethra:  normal appearing urethra with no masses, tenderness or lesions              Bartholins and Skenes: normal                 Vagina: atrophic appearing vagina with normal color and discharge, no lesions              Cervix: no lesions               Chaperone was present for exam.  1. Postmenopausal bleeding Thickened endometrium, inactive endometrium on biopsy. We discussed the option of sonohysterogram to confirm polyp vs hysteroscopy, D&C. She would prefer to start with the sonohysterogram - [name] Sonohysterogram; Future  2. Thickened endometrium See above - [name] Sonohysterogram; Future  3. Atrophic vaginitis Discussed vaginal estrogen, she would like to try it. So far she has only used estrogen externally and she is almost out - estradiol (ESTRACE) 0.1 MG/GM vaginal cream; 1 gram vaginally twice weekly  Dispense: 42.5 g; Refill: 1  4. Chronic vulvitis - WET PREP FOR TRICH, YEAST, CLUE - betamethasone valerate ointment (VALISONE) 0.1 %; Apply 1 application topically 2 (two) times daily. Use for up to 2 weeks as needed  Dispense: 30 g; Refill: 0 -We also discussed vulvar skin care, information given  5. Yeast vaginitis - fluconazole (DIFLUCAN) 150 MG tablet; Take 1 tablet (150 mg total) by mouth once for 1 dose. Take one tablet.  Repeat in 72 hours if symptoms are not completely resolved.  Dispense: 2  tablet; Refill: 0 - betamethasone valerate ointment (VALISONE) 0.1 %; Apply 1 application topically 2 (two) times daily. Use for up to 2 weeks as needed  Dispense: 30 g; Refill: 0  6. Dysuria Not sure if it is internal or external dysuria, she has recently been treated for a UTI, also has vulvovaginitis.  - Urinalysis,Complete w/RFL Culture -She can try voiding in water -Can try azo to determine if the pain is external or  internal if her symptoms persist.   ~40 minutes spent in total patient care.

## 2021-04-06 NOTE — Patient Instructions (Signed)

## 2021-04-08 LAB — URINE CULTURE
MICRO NUMBER:: 12837604
SPECIMEN QUALITY:: ADEQUATE

## 2021-04-08 LAB — URINALYSIS, COMPLETE W/RFL CULTURE
Bilirubin Urine: NEGATIVE
Glucose, UA: NEGATIVE
Hgb urine dipstick: NEGATIVE
Hyaline Cast: NONE SEEN /LPF
Ketones, ur: NEGATIVE
Nitrites, Initial: NEGATIVE
Protein, ur: NEGATIVE
RBC / HPF: NONE SEEN /HPF (ref 0–2)
Specific Gravity, Urine: 1.015 (ref 1.001–1.035)
pH: 5.5 (ref 5.0–8.0)

## 2021-04-08 LAB — CULTURE INDICATED

## 2021-04-13 ENCOUNTER — Other Ambulatory Visit: Payer: Self-pay | Admitting: Internal Medicine

## 2021-04-13 DIAGNOSIS — F409 Phobic anxiety disorder, unspecified: Secondary | ICD-10-CM

## 2021-04-13 DIAGNOSIS — K909 Intestinal malabsorption, unspecified: Secondary | ICD-10-CM

## 2021-04-13 DIAGNOSIS — D649 Anemia, unspecified: Secondary | ICD-10-CM

## 2021-04-23 ENCOUNTER — Other Ambulatory Visit: Payer: Self-pay | Admitting: Otolaryngology

## 2021-04-23 DIAGNOSIS — R1310 Dysphagia, unspecified: Secondary | ICD-10-CM

## 2021-05-03 ENCOUNTER — Other Ambulatory Visit: Payer: Self-pay | Admitting: Otolaryngology

## 2021-05-03 ENCOUNTER — Ambulatory Visit (INDEPENDENT_AMBULATORY_CARE_PROVIDER_SITE_OTHER): Payer: Medicare Other

## 2021-05-03 ENCOUNTER — Ambulatory Visit (INDEPENDENT_AMBULATORY_CARE_PROVIDER_SITE_OTHER): Payer: Medicare Other | Admitting: Obstetrics and Gynecology

## 2021-05-03 ENCOUNTER — Encounter: Payer: Self-pay | Admitting: Obstetrics and Gynecology

## 2021-05-03 ENCOUNTER — Other Ambulatory Visit: Payer: Self-pay

## 2021-05-03 VITALS — BP 116/64 | HR 72 | Wt 206.0 lb

## 2021-05-03 DIAGNOSIS — I10 Essential (primary) hypertension: Secondary | ICD-10-CM

## 2021-05-03 DIAGNOSIS — R1310 Dysphagia, unspecified: Secondary | ICD-10-CM

## 2021-05-03 DIAGNOSIS — R9389 Abnormal findings on diagnostic imaging of other specified body structures: Secondary | ICD-10-CM | POA: Diagnosis not present

## 2021-05-03 DIAGNOSIS — N950 Postmenopausal bleeding: Secondary | ICD-10-CM | POA: Diagnosis not present

## 2021-05-03 DIAGNOSIS — R633 Feeding difficulties, unspecified: Secondary | ICD-10-CM

## 2021-05-03 DIAGNOSIS — E7849 Other hyperlipidemia: Secondary | ICD-10-CM

## 2021-05-03 NOTE — Progress Notes (Signed)
GYNECOLOGY  VISIT   HPI: 76 y.o.   Divorced White or Caucasian Not Hispanic or Latino  female   [id number] with No LMP recorded. Patient is postmenopausal.   here for further evaluation of PMP bleeding. U/S with thickened endometrial stripe, endometrial biopsy with inactive endometrium.    GYNECOLOGIC HISTORY: No LMP recorded. Patient is postmenopausal. Contraception:NA Menopausal hormone therapy: no, only using vaginal estrogen        OB History     Gravida  2   Para  2   Term  2   Preterm      AB      Living  2      SAB      IAB      Ectopic      Multiple      Live Births                 Patient Active Problem List   Diagnosis Date Noted   Abnormal findings on diagnostic imaging of other specified body structures 03/13/2021   Gastroesophageal reflux disease 03/13/2021   Hematochezia 03/13/2021   Personal history of colonic polyps 03/13/2021   Rectal bleeding 03/13/2021   Allergic reaction 03/13/2021   OSA (obstructive sleep apnea) 11/09/2020   Edema 09/20/2020   Essential hypertension 09/20/2020   B12 deficiency 06/24/2020   Vitamin D deficiency 06/24/2020   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   Multiple food allergies 06/20/2020   Intestinal malabsorption 06/20/2020   Irritable bowel syndrome with constipation 01/04/2020   Prolapsed internal hemorrhoids, grade 4 01/04/2020   Internal and external bleeding hemorrhoids 01/04/2020   Melanoma ([place]) 03/22/2019   Heart palpitations 03/22/2019   Hyperlipidemia 04/27/2014   Chronic osteoarthritis 04/27/2014   Chest pain 12/10/2012   Blurred vision, bilateral 12/10/2012   Hematuria 12/10/2012   Anxiety attack 12/10/2012    Past Medical History:  Diagnosis Date   Acid reflux    Allergy to alpha-gal    Anxiety    Bilateral cataracts    CIN I (cervical intraepithelial neoplasia I)    Colon polyps    benign   Hemorrhoid    History of basal cell carcinoma (BCC) excision    History of  gallstones    History of melanoma excision RIGHT LEG  --  2012   History of Wolff-Parkinson-White (WPW) syndrome    1997  s/p  ablation   Lyme disease    Mild carotid artery disease ([place])    per duplex 12-10-2012  bilateral ICA 1-39% stenosis   SVT (supraventricular tachycardia) ([place]) 10/31/2014    Past Surgical History:  Procedure Laterality Date   BREAST SURGERY Left 1999   BREAST MASS EXCISED, benign   CARDIAC ELECTROPHYSIOLOGY STUDY AND ABLATION  1997   SUCCESSFUL ABLATION OF WOLFE-PARKINSON-WHITE SYNDROME  (NO ISSUES SINCE)   CATARACT EXTRACTION, BILATERAL     CERVICAL BIOPSY  W/ LOOP ELECTRODE EXCISION  2005   dr gottsegen   COLONOSCOPY     COLPOSCOPY     HYSTEROSCOPY WITH D & C  02/13/2012   Procedure: DILATATION AND CURETTAGE /HYSTEROSCOPY;  Surgeon: Bennetta Laos, MD;  Location: [place];  Service: Gynecology;  Laterality: N/A;  deficit - [place]  05-16-2005  dr Brantley Stage [facility]   melanoma excised     SVT-CARDIAC ABLATION     TRANSTHORACIC ECHOCARDIOGRAM  12-10-2012   dr Wynonia Lawman   mild LVH, ef 55-60%/  mild MR/  mild LAE and  RAE   TUBAL LIGATION     UPPER GI ENDOSCOPY     VULVECTOMY N/A 07/25/2017   Procedure: WIDE EXCISION VULVECTOMY;  Surgeon: Marti Sleigh, MD;  Location: [facility];  Service: Gynecology;  Laterality: N/A;    Current Outpatient Medications  Medication Sig Dispense Refill   ALPRAZolam (XANAX) 0.5 MG tablet TAKE 1 TABLET BY MOUTH EVERY DAY AT BEDTIME AS NEEDED 30 tablet 4   betamethasone valerate ointment (VALISONE) 0.1 % Apply 1 application topically 2 (two) times daily. Use for up to 2 weeks as needed 30 g 0   cetirizine (ZYRTEC) 10 MG tablet Take 10 mg by mouth daily.     cromolyn (GASTROCROM) 100 MG/5ML solution Take 200 mg by mouth 4 (four) times daily as needed.     Cyanocobalamin (VITAMIN B-12 PO) Take by mouth.     EPINEPHrine 0.3 mg/0.3 mL IJ SOAJ injection Use as directed  for severe allergic reaction 2 Device 2   estradiol (ESTRACE) 0.1 MG/GM vaginal cream 1 gram vaginally twice weekly 42.5 g 1   fluticasone (FLONASE) 50 MCG/ACT nasal spray Place into both nostrils daily.     hydrocortisone (ANUSOL-HC) 2.5 % rectal cream SMARTSIG:1 Topical Every Night     losartan-hydrochlorothiazide (HYZAAR) 50-12.5 MG tablet TAKE 1 TABLET BY MOUTH EVERY DAY 90 tablet 1   omeprazole (PRILOSEC) 20 MG capsule Take by mouth every morning.     VITAMIN A PO Take by mouth.     VITAMIN D PO Take by mouth.     No current facility-administered medications for this visit.     ALLERGIES: Iodinated contrast media, Alpha-gal, Gluten meal, Milk-related compounds, Shellfish allergy, Sulfa antibiotics, Azithromycin, Other, and Wheat bran  Family History  Problem Relation Age of Onset   Cancer Mother        ESOPHAGEAL CANCER   Diabetes Father    Hyperlipidemia Father    Heart disease Father    Heart attack Father    Heart disease Brother    Breast cancer Paternal Aunt        Age 43's   Hyperlipidemia Paternal Grandmother    Heart disease Paternal Grandmother    Stroke Paternal Grandmother    Diabetes Paternal Grandfather    Hyperlipidemia Paternal Grandfather    Heart disease Paternal Grandfather    Stroke Paternal Grandfather    Cancer Sister        skin   Stroke Maternal Grandmother    Stroke Maternal Grandfather    Diabetes Brother     Social History   Socioeconomic History   Marital status: Divorced    Spouse name: Not on file   Number of children: 2   Years of education: Not on file   Highest education level: Not on file  Occupational History   Not on file  Tobacco Use   Smoking status: Never   Smokeless tobacco: Never  Vaping Use   Vaping Use: Never used  Substance and Sexual Activity   Alcohol use: Yes    Alcohol/week: 0.0 standard drinks    Comment: rare   Drug use: No   Sexual activity: Not Currently    Birth control/protection: Surgical     Comment: 1st intercourse 76 yo--Fewer than 5 partners  Other Topics Concern   Not on file  Social History Narrative   Not on file   Social Determinants of Health   Financial Resource Strain: Not on file  Food Insecurity: Not on file  Transportation Needs: Not on  file  Physical Activity: Not on file  Stress: Not on file  Social Connections: Not on file  Intimate Partner Violence: Not on file    ROS  PHYSICAL EXAMINATION:    BP 116/64    Pulse 72    Wt 206 lb (93.4 kg)    SpO2 99%    BMI 31.32 kg/m     General appearance: alert, cooperative and appears stated age Neck: no adenopathy, supple, symmetrical, trachea midline and thyroid normal to inspection and palpation Heart: regular rate and rhythm Lungs: CTAB Abdomen: soft, non-tender; bowel sounds normal; no masses,  no organomegaly Extremities: normal, atraumatic, no cyanosis Skin: normal color, texture and turgor, no rashes or lesions Lymph: normal cervical supraclavicular and inguinal nodes Neurologic: grossly normal   Pelvic: External genitalia:  no lesions              Urethra:  normal appearing urethra with no masses, tenderness or lesions              Bartholins and Skenes: normal                 Vagina: normal appearing vagina with normal color and discharge, no lesions              Cervix: no lesions              Chaperone was present for exam.  Pelvic sonohysterogram:  The procedure and risks of the procedure were reviewed with the patient, consent form was signed.  Indications: PMP bleeding, thickened endometrial stripe, endometrial biopsy with inactive endometrium  Limited vaginal ulturasound with normal sized uterus, arcuate appearing. Thickened endometrium on the right side, measuring 5.3 mm.  Sonohysterogram A speculum was placed in the vagina and the cervix was cleansed with betadine. The sonohysterogram catheter was inserted into the uterine cavity without difficulty. The speculum was removed and the  ultrasound probe was placed. Saline was infused under direct observation with the ultrasound. There was intracavitary distortion at the fundus, measuring 0.7 x 0.5 cm. Concerning for possible polyp. The ultrasound and the catheter were removed.   Impression:  Sonohysterogram with endometrial defect concerning for an endometrial polyp.   1. Postmenopausal bleeding Sonohysterogram concerning for endometrial polyp Plan: hysteroscopy, possible polypectomy, dilation and curettage. Reviewed risks, including: bleeding, infection, uterine perforation, fluid overload, need for further sugery   2. Thickened endometrium See above  3. Essential hypertension Well controlled, will see if her primary wants to see her prior to surgery  In addition to reviewing the ultrasound images, time was spent in counseling and management. Surgery will be scheduled.

## 2021-05-07 ENCOUNTER — Telehealth: Payer: Self-pay | Admitting: Obstetrics and Gynecology

## 2021-05-07 ENCOUNTER — Encounter: Payer: Self-pay | Admitting: Obstetrics and Gynecology

## 2021-05-07 NOTE — Progress Notes (Signed)
05/08/21 1:35 PM   Anna [name] 1946/03/31 [id number]  Referring provider:  Kirk Ruths, MD [facility] [place],  [place] [address] Chief Complaint  Patient presents with   Recurrent UTI     HPI: Anna [name] is a 76 y.o.female with a personal history of recurrent UTIs/atrophic vaginitis, who presents today for a 1 year follow-up with UA.   She had extensive evaluation 12/2019, please see notes for details.  She had an infection in early December of 2022 that grew E.coli. She was treated with Keflex.  Urinalysis at that time was frankly positive, she was symptomatic and improved with antibiotics.  She had a recent urine culture on 04/06/2021 that grew Streptococcus agalactiae.  At the time, her urinalysis was questionable.  She reports that she was seen by gynecology and she had blood in her vagina. She had two transvaginal ultrasounds and she was told she may have a polyp. She was given a steroid estrogen cream and had relief on this cream.  She will be going for biopsy in the near future and is confident that some of the symptoms that she is experiencing are primarily vaginal in nature rather than related to bladder or urethral.  She reports that her bladder has been doing fine. She reports that she burns only when urine makes contact with the skin.  She is not symptomatic today.  She reports that she thinks her urinary frequency is typical based on her age and weight.   PMH: Past Medical History:  Diagnosis Date   Acid reflux    Allergy to alpha-gal    Anxiety    Bilateral cataracts    CIN I (cervical intraepithelial neoplasia I)    Colon polyps    benign   Hemorrhoid    History of basal cell carcinoma (BCC) excision    History of gallstones    History of melanoma excision RIGHT LEG  --  2012   History of Wolff-Parkinson-White (WPW) syndrome    1997  s/p  ablation   Lyme disease    Mild carotid artery disease ([place])     per duplex 12-10-2012  bilateral ICA 1-39% stenosis   SVT (supraventricular tachycardia) ([place]) 10/31/2014    Surgical History: Past Surgical History:  Procedure Laterality Date   BREAST SURGERY Left 1999   BREAST MASS EXCISED, benign   CARDIAC ELECTROPHYSIOLOGY STUDY AND ABLATION  1997   SUCCESSFUL ABLATION OF WOLFE-PARKINSON-WHITE SYNDROME  (NO ISSUES SINCE)   CATARACT EXTRACTION, BILATERAL     CERVICAL BIOPSY  W/ LOOP ELECTRODE EXCISION  2005   dr gottsegen   COLONOSCOPY     COLPOSCOPY     HYSTEROSCOPY WITH D & C  02/13/2012   Procedure: DILATATION AND CURETTAGE /HYSTEROSCOPY;  Surgeon: Bennetta Laos, MD;  Location: [place];  Service: Gynecology;  Laterality: N/A;  deficit - [place]   LAPAROSCOPIC CHOLECYSTECTOMY  05-16-2005  dr Brantley Stage [facility]   melanoma excised     SVT-CARDIAC ABLATION     TRANSTHORACIC ECHOCARDIOGRAM  12-10-2012   dr Wynonia Lawman   mild LVH, ef 55-60%/  mild MR/  mild LAE and RAE   TUBAL LIGATION     UPPER GI ENDOSCOPY     VULVECTOMY N/A 07/25/2017   Procedure: WIDE EXCISION VULVECTOMY;  Surgeon: Marti Sleigh, MD;  Location: [facility];  Service: Gynecology;  Laterality: N/A;    Home Medications:  Allergies as of 05/08/2021  Reactions   Iodinated Contrast Media Hypertension   Patient experienced elevated heart rate, hypertension and had to be taken to the ED   Alpha-gal    Other reaction(s): facial redness, upset stomach   Gluten Meal    Milk-related Compounds    Other reaction(s): facial flushing,  upset stomach, diarrhea   Shellfish Allergy Hives   Sulfa Antibiotics Hives, Nausea Only   Azithromycin Diarrhea, Palpitations, Other (See Comments)   Reaction:  Cramping    Other Rash   Alpha gal Red meat Stomach pain   Wheat Bran Rash   Stomach pain        Medication List        Accurate as of May 08, 2021  1:35 PM. If you have any questions, ask your nurse or doctor.          STOP taking  these medications    cromolyn 100 MG/5ML solution Commonly known as: GASTROCROM Stopped by: Hollice Espy, MD   fluticasone 50 MCG/ACT nasal spray Commonly known as: FLONASE Stopped by: Hollice Espy, MD       TAKE these medications    ALPRAZolam 0.5 MG tablet Commonly known as: XANAX TAKE 1 TABLET BY MOUTH EVERY DAY AT BEDTIME AS NEEDED   betamethasone valerate ointment 0.1 % Commonly known as: VALISONE Apply 1 application topically 2 (two) times daily. Use for up to 2 weeks as needed   cetirizine 10 MG tablet Commonly known as: ZYRTEC Take 10 mg by mouth daily.   EPINEPHrine 0.3 mg/0.3 mL Soaj injection Commonly known as: EPI-PEN Use as directed for severe allergic reaction   estradiol 0.1 MG/GM vaginal cream Commonly known as: ESTRACE 1 gram vaginally twice weekly   hydrocortisone 2.5 % rectal cream Commonly known as: ANUSOL-HC SMARTSIG:1 Topical Every Night   losartan-hydrochlorothiazide 50-12.5 MG tablet Commonly known as: HYZAAR TAKE 1 TABLET BY MOUTH EVERY DAY   omeprazole 20 MG capsule Commonly known as: PRILOSEC Take by mouth every morning.   VITAMIN A PO Take by mouth.   VITAMIN B-12 PO Take by mouth.   VITAMIN D PO Take by mouth.        Allergies:  Allergies  Allergen Reactions   Iodinated Contrast Media Hypertension    Patient experienced elevated heart rate, hypertension and had to be taken to the ED   Alpha-Gal     Other reaction(s): facial redness, upset stomach   Gluten Meal    Milk-Related Compounds     Other reaction(s): facial flushing,  upset stomach, diarrhea   Shellfish Allergy Hives   Sulfa Antibiotics Hives and Nausea Only   Azithromycin Diarrhea, Palpitations and Other (See Comments)    Reaction:  Cramping    Other Rash    Alpha gal Red meat Stomach pain   Wheat Bran Rash    Stomach pain    Family History: Family History  Problem Relation Age of Onset   Cancer Mother        ESOPHAGEAL CANCER   Diabetes  Father    Hyperlipidemia Father    Heart disease Father    Heart attack Father    Heart disease Brother    Breast cancer Paternal Aunt        Age 76's   Hyperlipidemia Paternal Grandmother    Heart disease Paternal Grandmother    Stroke Paternal Grandmother    Diabetes Paternal Grandfather    Hyperlipidemia Paternal Grandfather    Heart disease Paternal Grandfather    Stroke Paternal Grandfather  Cancer Sister        skin   Stroke Maternal Grandmother    Stroke Maternal Grandfather    Diabetes Brother     Social History:  reports that she has never smoked. She has never used smokeless tobacco. She reports current alcohol use. She reports that she does not use drugs.   Physical Exam: BP 126/82    Pulse 91    Ht 5\' 8"  (1.727 m)    Wt 206 lb (93.4 kg)    BMI 31.32 kg/m   Constitutional:  Alert and oriented, No acute distress. HEENT: [place] AT, moist mucus membranes.  Trachea midline, no masses. Cardiovascular: No clubbing, cyanosis, or edema. Respiratory: Normal respiratory effort, no increased work of breathing. Skin: No rashes, bruises or suspicious lesions. Neurologic: Grossly intact, no focal deficits, moving all 4 extremities. Psychiatric: Normal mood and affect.  Laboratory Data:  Lab Results  Component Value Date   CREATININE 0.91 12/22/2020   Urinalysis 11-30 wbcs, many bacteria, and nitrite positive.   Assessment & Plan:    Atrophic vaginitis  - She is currently on estrogen cream, recommend she continue this. Recommend she continue putting it around her urethra tube - Urinalysis today was nitrite positive will send this for culture-would only treat if her urine culture is clonal and she becomes symptomatic - This is followed closely by gynecology   2. Urinary urgency - Not currently bothered by this  - She is not on any medications for this and does not desire any further intervention at this time  3. Recurrent UTI  - Asymptomatic today   - will send urine  for culture to rule out infection     I,Kailey Littlejohn,acting as a scribe for Hollice Espy, MD.,have documented all relevant documentation on the behalf of Hollice Espy, MD,as directed by  Hollice Espy, MD while in the presence of Hollice Espy, MD.  I have reviewed the above documentation for accuracy and completeness, and I agree with the above.   Hollice Espy, MD   [facility] [address] [place], [place] [address] [phone]

## 2021-05-07 NOTE — Telephone Encounter (Signed)
Surgery: CPT [id number] - Hysteroscopy/D&C/Myosure,   Diagnosis: N95.0 Postmenopausal Bleeding,   Location: [place]  Status: Outpatient  Time: 4 Minutes  Assistant: N/A  Urgency: At Patient's Convenience  Pre-Op Appointment: Completed  Post-Op Appointment(s): , 1 Week  Time Out Of Work: Day Of Surgery ONLY  Patient with hypertension, please see if her primary feels she needs to see her for clearance.

## 2021-05-08 ENCOUNTER — Ambulatory Visit (INDEPENDENT_AMBULATORY_CARE_PROVIDER_SITE_OTHER): Payer: Medicare Other | Admitting: Urology

## 2021-05-08 ENCOUNTER — Telehealth: Payer: Self-pay | Admitting: Internal Medicine

## 2021-05-08 ENCOUNTER — Other Ambulatory Visit: Payer: Self-pay

## 2021-05-08 VITALS — BP 126/82 | HR 91 | Ht 68.0 in | Wt 206.0 lb

## 2021-05-08 DIAGNOSIS — N952 Postmenopausal atrophic vaginitis: Secondary | ICD-10-CM

## 2021-05-08 DIAGNOSIS — N390 Urinary tract infection, site not specified: Secondary | ICD-10-CM | POA: Diagnosis not present

## 2021-05-08 LAB — URINALYSIS, COMPLETE
Bilirubin, UA: NEGATIVE
Glucose, UA: NEGATIVE
Ketones, UA: NEGATIVE
Nitrite, UA: POSITIVE — AB
RBC, UA: NEGATIVE
Specific Gravity, UA: >1.03 — ABNORMAL HIGH (ref 1.005–1.030)
Urobilinogen, Ur: 0.2 mg/dL (ref 0.2–1.0)
pH, UA: 5.5 (ref 5.0–7.5)

## 2021-05-08 LAB — MICROSCOPIC EXAMINATION

## 2021-05-08 NOTE — Telephone Encounter (Signed)
Sharee Pimple from [facility] called. Patient is having a [place] on 05/28/2021 and wanted to know if patient needed medical clearance. If so please fax medical clearance to [phone] attn Sharee Pimple or call Sharee Pimple at [phone] to let her know if patient does not need medical clearance.

## 2021-05-08 NOTE — Telephone Encounter (Signed)
Spoke with patient regarding surgery benefits. Patient acknowledges understanding of information presented. Patient is aware that benefits presented are professional benefits only. Patient is aware the hospital will call with facility benefits. See account note.  Patient requested surgery details be sent to her via MyChart so that she is able to reach out to her insurance to obtain further details.  Routing to Glorianne Manchester, Therapist, sports, for surgery scheduling.  The following message was sent to the patient via [place].  Anna [name]  M[place], Generic Good morning Anna [name],   Per your request, I have included the details of your surgery below.   CPT (Procedure) Code: 54562  Diagnosis Code: N95.0   Your surgery will be an outpatient surgery performed in an outpatient hospital setting, at [facility].   Please let me know if you have any additional questions or if I can be of additional assistance.   Thanks so much,  Derel Mcglasson  Patient Account Representative

## 2021-05-08 NOTE — Telephone Encounter (Signed)
Left message to call Kristy Schomburg, RN at [facility], [phone], OPT 5.  

## 2021-05-08 NOTE — Telephone Encounter (Signed)
Spoke with patient. Reviewed surgery dates. Patient request to proceed with surgery on 05/28/21, declines earlier date.  Advised patient I will forward to business office for return call. I will return call once surgery date and time confirmed.   Confirmed PCP on file, will call to determine if surgical clearance needed. Advised patient PCP may reach out to her if additional f/u needed for clearance.   Patient verbalizes understanding and is agreeable.   Surgery request sent.   Call placed to PCP/ Dr. Derrel Nip. Spoke with Santiago Glad. Patient is scheduled for Hysteroscopy D&C on 05/28/21, Hx of HTN, does patient need clearance? If so, please fax to [place] at [phone].

## 2021-05-08 NOTE — Telephone Encounter (Signed)
Spoke with patient. Reviewed surgery dates. Patient would like to review dates with her daughter and return call.

## 2021-05-09 ENCOUNTER — Encounter: Payer: Self-pay | Admitting: Urology

## 2021-05-09 NOTE — Telephone Encounter (Signed)
Noted. Pt has not been seen since 08/2020 is due for 6 month follow up. Will keep appt scheduled.

## 2021-05-09 NOTE — Telephone Encounter (Signed)
Per PCP, Dr. Derrel Nip, does not need medical clearance.

## 2021-05-11 ENCOUNTER — Encounter: Payer: Self-pay | Admitting: Internal Medicine

## 2021-05-11 ENCOUNTER — Ambulatory Visit (INDEPENDENT_AMBULATORY_CARE_PROVIDER_SITE_OTHER): Payer: Medicare Other | Admitting: Internal Medicine

## 2021-05-11 ENCOUNTER — Other Ambulatory Visit: Payer: Self-pay

## 2021-05-11 ENCOUNTER — Telehealth: Payer: Self-pay | Admitting: *Deleted

## 2021-05-11 VITALS — BP 126/86 | HR 78 | Temp 97.8°F | Ht 68.0 in | Wt 207.6 lb

## 2021-05-11 DIAGNOSIS — Z86010 Personal history of colonic polyps: Secondary | ICD-10-CM

## 2021-05-11 DIAGNOSIS — I10 Essential (primary) hypertension: Secondary | ICD-10-CM | POA: Diagnosis not present

## 2021-05-11 DIAGNOSIS — K921 Melena: Secondary | ICD-10-CM

## 2021-05-11 DIAGNOSIS — R5383 Other fatigue: Secondary | ICD-10-CM

## 2021-05-11 DIAGNOSIS — E7849 Other hyperlipidemia: Secondary | ICD-10-CM | POA: Diagnosis not present

## 2021-05-11 DIAGNOSIS — N950 Postmenopausal bleeding: Secondary | ICD-10-CM

## 2021-05-11 DIAGNOSIS — K2100 Gastro-esophageal reflux disease with esophagitis, without bleeding: Secondary | ICD-10-CM

## 2021-05-11 DIAGNOSIS — G4733 Obstructive sleep apnea (adult) (pediatric): Secondary | ICD-10-CM

## 2021-05-11 DIAGNOSIS — Z1211 Encounter for screening for malignant neoplasm of colon: Secondary | ICD-10-CM

## 2021-05-11 DIAGNOSIS — N390 Urinary tract infection, site not specified: Secondary | ICD-10-CM

## 2021-05-11 LAB — CULTURE, URINE COMPREHENSIVE

## 2021-05-11 MED ORDER — AMOXICILLIN-POT CLAVULANATE 875-125 MG PO TABS: 1.0000 | ORAL_TABLET | Freq: Two times a day (BID) | ORAL | 0 refills | Status: DC

## 2021-05-11 NOTE — Telephone Encounter (Signed)
Call placed to patient to review pre-op orders.   Patient states she was seen by urology on 2/7 for UTI and is f/u with PCP today at 2pm. Patient states she wants to further discuss with PCP before deciding if she should delay surgery until UTI Treated. Patient will call on 2/13 to provide update. I will update Dr. Talbert Nan. Advised I will return call if any additional recommendations. Patient agreeable and thankful for call.   Routing to Dr. Rosann Auerbach.

## 2021-05-11 NOTE — Telephone Encounter (Addendum)
Patient informed, she states she was not clear on her symptoms when she was here for an office visit. She did have ongoing dysuria, she has been having chills that started last night. Sent in Augmentin to [facility].     ----- Message from Hollice Espy, MD sent at 05/11/2021  1:09 PM EST ----- When this patient was seen in the office, she was not have any UTI specific symptoms including dysuria, bladder pain, fevers or chills.  She did not growing Klebsiella in her urine.  If she is asymptomatic, would recommend against treating this.  If she is having symptoms, could with Augmentin twice daily for 5 days.  Hollice Espy, MD

## 2021-05-11 NOTE — Progress Notes (Signed)
Subjective:  Patient ID: Anna [name], female    DOB: 20-Mar-1946  Age: 76 y.o. MRN: [id number]  CC: The primary encounter diagnosis was Essential hypertension. Diagnoses of Other hyperlipidemia, Fatigue, unspecified type, Gastroesophageal reflux disease with esophagitis without hemorrhage, Hematochezia, Colon cancer screening, Recurrent UTI, Personal history of colonic polyps, OSA (obstructive sleep apnea), and Post-menopausal bleeding were also pertinent to this visit.   This visit occurred during the SARS-CoV-2 public health emergency.  Safety protocols were in place, including screening questions prior to the visit, additional usage of staff PPE, and extensive cleaning of exam room while observing appropriate contact time as indicated for disinfecting solutions.    HPI Anna [name] presents for  Chief Complaint  Patient presents with   Follow-up    23mof/u for HTN. Pt states also wants to discuss upcoming GYN surgery and chronic UTI. Stated has seen U[place]for UTI. Placed on Augmentin today by URO.   1)  Hypertension: patient checks blood pressure twice weekly at home.  Readings have been for the most part  equal to or less than 120/80 at rest . Patient is following a reduced salt diet most days and is taking medications as prescribed   2) Recurrent UTI:  currently beginning treatment with augmentin prescribed by urology  for Klebsiella UTI., her 3rd in 3 months.   She was not symptomatic during  recent urology visit because she was using a topical  analgesic cream on her urethra and labia,  provided by her gyn.She is worried that her frequent episodes of urinary tract infections suggest that she has a colovesical fistula  ..Marland Kitchen REview ed her urine cultures done today:only one of the last 3 was E Coli. (Group B strep  was cultured from urine in January) ,  and the next most recent one was May 2021, miixed flora.   She denies any history of feculent vaginal discharge.  She has not had a  cystoscopy or CT of abd/pelvis.    3) GERD:  dysphagia has now improved with treatment of GERD,  avoidance of irritating foods, and use of wedge pillow to elevate her torso.  Her swallow study is scheduled   4) she is scheduled for a hysteroscopy and D&C Feb 26 to evaluate thickened endometrium seen on ultrasound, done to evaluate PMB.  Biopsy was negative.   5) OSA: by August 2022  study,  mild.     Outpatient Medications Prior to Visit  Medication Sig Dispense Refill   ALPRAZolam (XANAX) 0.5 MG tablet TAKE 1 TABLET BY MOUTH EVERY DAY AT BEDTIME AS NEEDED 30 tablet 4   amoxicillin-clavulanate (AUGMENTIN) 875-125 MG tablet Take 1 tablet by mouth 2 (two) times daily. 10 tablet 0   betamethasone valerate ointment (VALISONE) 0.1 % Apply 1 application topically 2 (two) times daily. Use for up to 2 weeks as needed 30 g 0   cetirizine (ZYRTEC) 10 MG tablet Take 10 mg by mouth daily.     Cyanocobalamin (VITAMIN B-12 PO) Take by mouth.     EPINEPHrine 0.3 mg/0.3 mL IJ SOAJ injection Use as directed for severe allergic reaction 2 Device 2   estradiol (ESTRACE) 0.1 MG/GM vaginal cream 1 gram vaginally twice weekly 42.5 g 1   hydrocortisone (ANUSOL-HC) 2.5 % rectal cream SMARTSIG:1 Topical Every Night     losartan-hydrochlorothiazide (HYZAAR) 50-12.5 MG tablet TAKE 1 TABLET BY MOUTH EVERY DAY 90 tablet 1   omeprazole (PRILOSEC) 20 MG capsule Take by mouth every morning.  VITAMIN A PO Take by mouth.     VITAMIN D PO Take by mouth.     No facility-administered medications prior to visit.    Review of Systems;  Patient denies headache, fevers, malaise, unintentional weight loss, skin rash, eye pain, sinus congestion and sinus pain, sore throat, dysphagia,  hemoptysis , cough, dyspnea, wheezing, chest pain, palpitations, orthopnea, edema, abdominal pain, nausea, melena, diarrhea, constipation, flank pain, dysuria, hematuria, urinary  Frequency, nocturia, numbness, tingling, seizures,  Focal  weakness, Loss of consciousness,  Tremor, insomnia, depression, anxiety, and suicidal ideation.      Objective:  BP 126/86 (BP Location: Left Arm, Patient Position: Sitting, Cuff Size: Small)    Pulse 78    Temp 97.8 F (36.6 C) (Oral)    Ht 5' 8"  (1.727 m)    Wt 207 lb 9.6 oz (94.2 kg)    SpO2 97%    BMI 31.57 kg/m   BP Readings from Last 3 Encounters:  05/11/21 126/86  05/08/21 126/82  05/03/21 116/64    Wt Readings from Last 3 Encounters:  05/11/21 207 lb 9.6 oz (94.2 kg)  05/08/21 206 lb (93.4 kg)  05/03/21 206 lb (93.4 kg)    General appearance: alert, cooperative and appears stated age Ears: normal TM's and external ear canals both ears Throat: lips, mucosa, and tongue normal; teeth and gums normal Neck: no adenopathy, no carotid bruit, supple, symmetrical, trachea midline and thyroid not enlarged, symmetric, no tenderness/mass/nodules Back: symmetric, no curvature. ROM normal. No CVA tenderness. Lungs: clear to auscultation bilaterally Heart: regular rate and rhythm, S1, S2 normal, no murmur, click, rub or gallop Abdomen: soft, non-tender; bowel sounds normal; no masses,  no organomegaly Pulses: 2+ and symmetric Skin: Skin color, texture, turgor normal. No rashes or lesions Lymph nodes: Cervical, supraclavicular, and axillary nodes normal.  No results found for: HGBA1C  Lab Results  Component Value Date   CREATININE 0.91 12/22/2020   CREATININE 0.88 09/19/2020   CREATININE 0.74 06/19/2020    Lab Results  Component Value Date   WBC 7.3 06/19/2020   HGB 12.7 06/19/2020   HCT 37.0 06/19/2020   HCT 37.5 06/19/2020   PLT 298.0 06/19/2020   GLUCOSE 111 (H) 12/22/2020   CHOL 201 (H) 12/10/2012   TRIG 77 12/10/2012   HDL 47 12/10/2012   LDLCALC 139 (H) 12/10/2012   ALT 13 06/19/2020   AST 13 06/19/2020   NA 141 12/22/2020   K 3.6 12/22/2020   CL 106 12/22/2020   CREATININE 0.91 12/22/2020   BUN 12 12/22/2020   CO2 26 12/22/2020   TSH 2.17 09/19/2020   INR  1.03 12/10/2012   MICROALBUR 4.2 (H) 09/19/2020    US PELVIS TRANSVAGINAL NON-OB (TV ONLY)  Result Date: 03/29/2021 Pelvic ultrasound   Indications: Postmenopausal bleeding   Findings:   Uterus 5.07 x 3.36 x 2.72 xm   Endometrium 6.17-8.6 mm, avascular   Left ovary 1.24 x 0.95 x 1.03 cm   Right ovary 1.51 x 0.80 x 0.85 cm   No free fluid   Impression: Small anteverted uterus Thickened endometrium, avascular Atrophic appearing ovaries bilaterally   Assessment & Plan:   Problem List Items Addressed This Visit     Essential hypertension - Primary    Well controlled on current regimen of hyzaar 50/12.5. Renal function stable, no changes today.      Relevant Orders   Comp Met (CMET)   Gastroesophageal reflux disease    With dysphagia . Aggravated by chocolate and  peppermint eating during Christmas.   Improving with omeprazole,  Wedge pillow  and reassurance by ENT . (mother had esophageal CA)  Swallow test is scheduled       Hematochezia    Etiology unclear,  But presumed secondary to bleeding from internal hemorrhoids. She is due for colonscopy (last one Nov  2013)      Hyperlipidemia   Relevant Orders   Lipid Profile   OSA (obstructive sleep apnea)    Mild by sleep study .AHI was 12.2/hr in August 2022.  CPAP titration  study was ordered but note done        Personal history of colonic polyps    Last colonoscopy in 2013  Was without polyps.  Only internal hemorrhoids were seen       Post-menopausal bleeding    With thickened endometrium on ultrasound,  Negative biopsy.  Hysteroscopy and D&C is planned in the near future.       Recurrent UTI    Nothing in history of  suggest anatomic fistulas.  Reviewed UTI history with patient and reassurance provided.  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       Other Visit Diagnoses     Fatigue, unspecified type       Relevant Orders   CBC with Differential/Platelet   Colon cancer screening        Relevant Orders   Ambulatory referral to Gastroenterology       I spent 30 minutes dedicated to the care of this patient on the date of this encounter to include pre-visit review of patient's medical history,  most recent imaging studies, Face-to-face time with the patient , and post visit ordering of testing and therapeutics.    Follow-up: No follow-ups on file.   Crecencio Mc, MD

## 2021-05-11 NOTE — Patient Instructions (Addendum)
You are being treated with augmentin for a Klebsiella UTI.  This is not a typical bacteria that caused by fecal contamination.   Take your pre/ probiotic   for at least 3 weeks, OR FOR LIFE if it improves your bowel movements

## 2021-05-11 NOTE — Telephone Encounter (Signed)
Her bladder infection should be cleared within a few days of antibiotics. Unless she develops an infection right before surgery, there is no need to delay.

## 2021-05-11 NOTE — Telephone Encounter (Signed)
Patient called Triage line to report some urine urgency/frequency does not want to wait over the weekend.

## 2021-05-11 NOTE — Assessment & Plan Note (Addendum)
With dysphagia . Aggravated by chocolate and peppermint eating during Christmas.   Improving with omeprazole,  Wedge pillow  and reassurance by ENT . (mother had esophageal CA)  Swallow test is scheduled

## 2021-05-13 DIAGNOSIS — N950 Postmenopausal bleeding: Secondary | ICD-10-CM

## 2021-05-13 DIAGNOSIS — N390 Urinary tract infection, site not specified: Secondary | ICD-10-CM | POA: Insufficient documentation

## 2021-05-13 HISTORY — DX: Postmenopausal bleeding: N95.0

## 2021-05-13 NOTE — Assessment & Plan Note (Signed)
Mild by sleep study .AHI was 12.2/hr in August 2022.  CPAP titration  study was ordered but note done

## 2021-05-13 NOTE — Assessment & Plan Note (Signed)
Well controlled on current regimen of hyzaar 50/12.5. Renal function stable, no changes today.

## 2021-05-13 NOTE — Assessment & Plan Note (Addendum)
Etiology unclear,  But presumed secondary to bleeding from internal hemorrhoids. She is due for colonscopy (last one Nov  2013)

## 2021-05-13 NOTE — Assessment & Plan Note (Signed)
Nothing in history of  suggest anatomic fistulas.  Reviewed UTI history with patient and reassurance provided.  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

## 2021-05-13 NOTE — Assessment & Plan Note (Signed)
With thickened endometrium on ultrasound,  Negative biopsy.  Hysteroscopy and D&C is planned in the near future.

## 2021-05-13 NOTE — Assessment & Plan Note (Signed)
Last colonoscopy in 2013  Was without polyps.  Only internal hemorrhoids were seen

## 2021-05-14 ENCOUNTER — Encounter: Payer: Self-pay | Admitting: Internal Medicine

## 2021-05-14 NOTE — Telephone Encounter (Signed)
Spoke with patient.  Advised per Dr. Talbert Nan. Patient states she started Abx for UTI on 05/11/21.   Surgery date request confirmed.  Advised surgery is scheduled for 05/28/21, [facility] at 11am.  Surgery instruction sheet and hospital brochure reviewed, printed copy will be mailed. Patient advised if Covid screening and quarantine requirements and agreeable.   Routing to provider. Encounter closed. Cc: Hayley Carder

## 2021-05-21 ENCOUNTER — Telehealth: Payer: Self-pay | Admitting: *Deleted

## 2021-05-21 NOTE — Telephone Encounter (Signed)
Spoke with patient.  Notified of surgery location change and time change.   Surgery scheduled for [facility] on 05/28/21 at 1150, arrive at 0950.   Patient verbalizes understanding and is agreeable.   Routing to provider for final review. Patient is agreeable to disposition. Will close encounter.  Cc: Hayley Carder

## 2021-05-21 NOTE — Pre-Procedure Instructions (Signed)
[place]  05/21/2021       Surgical Instructions   Your procedure is scheduled on Mon., Feb. 27, 2023 from 11:50AM-1:20PM.  Report to [facility] Main Entrance "A" at 9:50 A.M., then check in with the Admitting office.  Call this number if you have problems the morning of surgery:  [phone]   Remember:  Do not eat after midnight on Feb. 26th  You may drink clear liquids until 3 hours (8:50AM) prior to surgery time the morning of your surgery.   Clear liquids allowed are: Water, Non-Citrus Juices (without pulp), Carbonated Beverages, Clear Tea, Black Coffee ONLY (NO MILK, CREAM OR POWDERED CREAMER of any kind), and Gatorade    Take these medicines the morning of surgery with A SIP OF WATER: Omeprazole (PRILOSEC)  If Needed: acetaminophen (TYLENOL) sodium chloride (OCEAN)  As of today, STOP taking any Aspirin (unless otherwise instructed by your surgeon) Aleve, Naproxen, Ibuprofen, Motrin, Advil, Goody's, BC's, all herbal medications, fish oil, and all vitamins.             Day of Surgery: Do not wear jewelry or makeup Do not wear lotions, powders, perfumes/colognes, or deodorant. Do not shave 48 hours prior to surgery.  Men may shave face and neck. Do not bring valuables to the hospital. DO Not wear nail polish, gel polish, artificial nails, or any other type of covering on natural nails (fingers and toes) If you have artificial nails or gel coating that need to be removed by a nail salon, please have this removed prior to surgery. Artificial nails or gel coating may interfere with anesthesia's ability to adequately monitor your vital signs.             [facility] is not responsible for any belongings or valuables.  Do NOT Smoke (Tobacco/Vaping)  24 hours prior to your procedure  If you use a CPAP at night, you may bring your mask and machine for your overnight stay.   Contacts, glasses, hearing aids, dentures or partials may not be worn into surgery, please  bring cases for these belongings   For patients admitted to the hospital, discharge time will be determined by your treatment team.   Patients discharged the day of surgery will not be allowed to drive home, and someone needs to stay with them for 24 hours.  NO VISITORS WILL BE ALLOWED IN PRE-OP WHERE PATIENTS ARE PREPPED FOR SURGERY.  ONLY 1 SUPPORT PERSON MAY BE PRESENT IN THE WAITING ROOM WHILE YOU ARE IN SURGERY.  IF YOU ARE TO BE ADMITTED, ONCE YOU ARE IN YOUR ROOM YOU WILL BE ALLOWED TWO (2) VISITORS. 1 (ONE) VISITOR MAY STAY OVERNIGHT BUT MUST ARRIVE TO THE ROOM BY 8pm.  Minor children may have two parents present. Special consideration for safety and communication needs will be reviewed on a case by case basis.  Special instructions:    Oral Hygiene is also important to reduce your risk of infection.  Remember - BRUSH YOUR TEETH THE MORNING OF SURGERY WITH YOUR REGULAR TOOTHPASTE   [facility]- Preparing For Surgery  Before surgery, you can play an important role. Because skin is not sterile, your skin needs to be as free of germs as possible. You can reduce the number of germs on your skin by washing with CHG (chlorahexidine gluconate) Soap before surgery.  CHG is an antiseptic cleaner which kills germs and bonds with the skin to continue killing germs even after washing.    Please do  not use if you have an allergy to CHG or antibacterial soaps. If your skin becomes reddened/irritated stop using the CHG.  Do not shave (including legs and underarms) for at least 48 hours prior to first CHG shower. It is OK to shave your face.  Please follow these instructions carefully.    Shower the NIGHT BEFORE SURGERY and the MORNING OF SURGERY with CHG Soap.   If you chose to wash your hair, wash your hair first as usual with your normal shampoo. After you shampoo, rinse your hair and body thoroughly to remove the shampoo.  Then [employer] and genitals (private parts) with your normal soap and rinse  thoroughly to remove soap.  After that Use CHG Soap as you would any other liquid soap. You can apply CHG directly to the skin and wash gently with a scrungie or a clean washcloth.   Apply the CHG Soap to your body ONLY FROM THE NECK DOWN.  Do not use on open wounds or open sores. Avoid contact with your eyes, ears, mouth and genitals (private parts). Wash Face and genitals (private parts)  with your normal soap.   Wash thoroughly, paying special attention to the area where your surgery will be performed.  Thoroughly rinse your body with warm water from the neck down.  DO NOT shower/wash with your normal soap after using and rinsing off the CHG Soap.  Pat yourself dry with a CLEAN TOWEL.  Wear CLEAN PAJAMAS to bed the night before surgery  Place CLEAN SHEETS on your bed the night before your surgery  DO NOT SLEEP WITH PETS.  Reminder: Take a shower with CHG soap. Wear Clean/Comfortable clothing the morning of surgery Do not apply any deodorants/lotions.   Remember to brush your teeth WITH YOUR REGULAR TOOTHPASTE.  After your COVID test   You are not required to quarantine however you are required to wear a well-fitting mask when you are out and around people not in your household.  If your mask becomes wet or soiled, replace with a new one.  Wash your hands often with soap and water for 20 seconds or clean your hands with an alcohol-based hand sanitizer that contains at least 60% alcohol.  Do not share personal items.  Notify your provider: if you are in close contact with someone who has COVID  or if you develop a fever of 100.4 or greater, sneezing, cough, sore throat, shortness of breath or body aches.  Please read over the following fact sheets that you were given.

## 2021-05-22 ENCOUNTER — Encounter (HOSPITAL_COMMUNITY): Payer: Self-pay

## 2021-05-22 ENCOUNTER — Other Ambulatory Visit: Payer: Self-pay

## 2021-05-22 ENCOUNTER — Encounter (HOSPITAL_COMMUNITY)
Admission: RE | Admit: 2021-05-22 | Discharge: 2021-05-22 | Disposition: A | Discharge status: Home / Self Care | Payer: Medicare Other | Source: Ambulatory Visit | Attending: Obstetrics and Gynecology | Admitting: Obstetrics and Gynecology

## 2021-05-22 VITALS — BP 158/66 | HR 71 | Temp 97.8°F | Resp 17 | Ht 68.0 in | Wt 206.8 lb

## 2021-05-22 DIAGNOSIS — Z01818 Encounter for other preprocedural examination: Secondary | ICD-10-CM

## 2021-05-22 HISTORY — DX: Sleep apnea, unspecified: G47.30

## 2021-05-22 HISTORY — DX: Essential (primary) hypertension: I10

## 2021-05-22 LAB — CBC
HCT: 40 % (ref 36.0–46.0)
Hemoglobin: 13.4 g/dL (ref 12.0–15.0)
MCH: 32.3 pg (ref 26.0–34.0)
MCHC: 33.5 g/dL (ref 30.0–36.0)
MCV: 96.4 fL (ref 80.0–100.0)
Platelets: 321 10*3/uL (ref 150–400)
RBC: 4.15 MIL/uL (ref 3.87–5.11)
RDW: 13.4 % (ref 11.5–15.5)
WBC: 8.2 10*3/uL (ref 4.0–10.5)
nRBC: 0 % (ref 0.0–0.2)

## 2021-05-22 LAB — BASIC METABOLIC PANEL
Anion gap: 9 (ref 5–15)
BUN: 8 mg/dL (ref 8–23)
CO2: 29 mmol/L (ref 22–32)
Calcium: 9 mg/dL (ref 8.9–10.3)
Chloride: 103 mmol/L (ref 98–111)
Creatinine, Ser: 0.85 mg/dL (ref 0.44–1.00)
GFR, Estimated: >60 mL/min (ref >60)
Glucose, Bld: 98 mg/dL (ref 70–99)
Potassium: 3.5 mmol/L (ref 3.5–5.1)
Sodium: 141 mmol/L (ref 135–145)

## 2021-05-22 NOTE — Progress Notes (Signed)
PCP - Dr. Derrel Nip Cardiologist - Dr. Bartholome Bill  PPM/ICD - n/a Device Orders - n/a Rep Notified - n/a  Chest x-ray - n/a EKG - 05/22/21 Stress Test - 2014 ECHO - 12/10/12 Cardiac Cath - 1996  Sleep Study - 10/2020. Patient states she was diagnoses with mild OSA but does not use CPAP.   Fasting Blood Sugar - n/a Checks Blood Sugar _____ times a day- n/a  Blood Thinner Instructions: n/a Aspirin Instructions: n/a  ERAS Protcol - Yes PRE-SURGERY Ensure or G2- n/a  COVID TEST- Ambulatory Surgery.    Anesthesia review: Yes. Cardiac history.   Patient denies shortness of breath, fever, cough and chest pain at PAT appointment   All instructions explained to the patient, with a verbal understanding of the material. Patient agrees to go over the instructions while at home for a better understanding. The opportunity to ask questions was provided.

## 2021-05-23 ENCOUNTER — Ambulatory Visit
Admission: RE | Admit: 2021-05-23 | Discharge: 2021-05-23 | Disposition: A | Discharge status: Home / Self Care | Payer: Medicare Other | Source: Ambulatory Visit | Attending: Otolaryngology | Admitting: Otolaryngology

## 2021-05-23 ENCOUNTER — Encounter (HOSPITAL_COMMUNITY): Payer: Self-pay | Admitting: Vascular Surgery

## 2021-05-23 ENCOUNTER — Encounter: Payer: Self-pay | Admitting: Internal Medicine

## 2021-05-23 DIAGNOSIS — R1310 Dysphagia, unspecified: Secondary | ICD-10-CM | POA: Diagnosis not present

## 2021-05-23 DIAGNOSIS — R633 Feeding difficulties, unspecified: Secondary | ICD-10-CM | POA: Insufficient documentation

## 2021-05-23 NOTE — Progress Notes (Signed)
Anesthesia Chart Review:  Case: [id number] Date/Time: 05/28/21 1135   Procedure: DILATATION & CURETTAGE/HYSTEROSCOPY WITH MYOSURE   Anesthesia type: Choice   Pre-op diagnosis: postmenopausal bleeding   Location: [facility] OR ROOM 08 / [place] OR   Surgeons: Salvadore Dom, MD       DISCUSSION: Patient is a 76 year old female scheduled for the above procedure.  History includes never smoker, WPW/SVT (s/p ablation ~ 1997), HTN, OSA (mild OSA with AHI 12.2/hr, desaturations down to 83% 11/01/20 sleep study; CPAP titration study recommended but not done; does not use CPAP), carotid artery diease (mild 1-39% 2014), GERD, skin cancer ([place]; melanoma RLE excision 2012), Lyme disease, Alph-gal allergy, cholecystectomy (05/16/05), partial vulvectomy (07/25/17). BMI is consistent with obesity.  PCP Dr. Derrel Nip evaluated on 05/13/21. She is aware of surgery plans. Treated with Augmentin for Klebsiella UTI 05/11/21.   Anesthesia team to evaluate on the day of surgery.   VS: BP (!) 158/66    Pulse 71    Temp 36.6 C (Oral)    Resp 17    Ht 5\' 8"  (1.727 m)    Wt 93.8 kg    SpO2 100%    BMI 31.44 kg/m    PROVIDERS: Crecencio Mc, MD is PCP  Bartholome Bill, MD is cardiologist. Last visit 06/12/20. She was doing "fairly well from cardiac standpoint". Palpitations stable. One year follow-up recommended.    LABS: Labs reviewed: Acceptable for surgery. (all labs ordered are listed, but only abnormal results are displayed)  Labs Reviewed  BASIC METABOLIC PANEL  CBC    IMAGES: MRI C-spine 06/02/20: IMPRESSION: 1. No MRI evidence for acute traumatic injury within the cervical spine. 2. 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 3. Multifactorial degenerative changes with resultant multilevel foraminal narrowing as above. Notable findings include moderate right C4 and C5 foraminal stenosis, with moderate left worse than right C6  foraminal narrowing.    EKG: 05/22/21: Normal sinus rhythm Incomplete right bundle branch block Nonspecific T wave abnormality Abnormal ECG When compared with ECG of 25-Jul-2017 06:58, Confirmed by Virl Axe [phone]) on 05/22/2021 10:26:02 PM   CV: Summary of cardiac testing as outlined in 06/12/20 cardiology note by Dr. Ubaldo Glassing (see [facility] CE): "She was seen in 2014 where she had a stress test which was unremarkable.She has a history of Wolff-Parkinson-White syndrome and is status post ablation of this in 1996 per Dr. Tally Due at [facility] Workup at that time included a cardiac catheterization which showed no significant coronary disease. She had a functional study in 2014 which was unremarkable. LV function was normal. There was no significant valvular abnormalities. She has had palpitations in the past and was evaluated with a Holter monitor. This revealed sinus rhythm with PVCs. No A. fib or prolonged arrhythmias."  Echo 12/10/12: Study Conclusions  - Left ventricle: The cavity size was normal. Wall thickness    was increased in a pattern of mild LVH. Systolic function    was normal. The estimated ejection fraction was in the    range of 55% to 60%. Wall motion was normal; there were no    regional wall motion abnormalities.  - Mitral valve: Mildly to moderately calcified annulus. Mild    regurgitation.  - Left atrium: The atrium was mildly dilated.  - Right ventricle: The cavity size was mildly dilated. Wall    thickness was normal.  - Right atrium: The atrium was mildly dilated.  US Carotid 12/10/12: Summary:  Bilateral: 1-39% ICA stenosis. Vertebral artery flow is  antegrade. Duplex imaging ofthe brachial artery demonstrates  triphasic waveforms. Right: ICA/CCA ratio is 1.03. Left:  ICA/CCA ratio is 1.55.    Past Medical History:  Diagnosis Date   Acid reflux    Allergy to alpha-gal    Anxiety    Bilateral cataracts    Cancer (HCC)    Basal  Cell Carcinoma, Melanoma   CIN I (cervical intraepithelial neoplasia I)    Colon polyps    benign   Hemorrhoid    History of basal cell carcinoma (BCC) excision    History of gallstones    History of melanoma excision RIGHT LEG  --  2012   History of Wolff-Parkinson-White (WPW) syndrome    1997  s/p  ablation   Hypertension    Lyme disease    Mild carotid artery disease ([place])    per duplex 12-10-2012  bilateral ICA 1-39% stenosis   Sleep apnea    SVT (supraventricular tachycardia) ([place]) 10/31/2014    Past Surgical History:  Procedure Laterality Date   BREAST SURGERY Left 1999   BREAST MASS EXCISED, benign   CARDIAC ELECTROPHYSIOLOGY STUDY AND ABLATION  1997   SUCCESSFUL ABLATION OF WOLFE-PARKINSON-WHITE SYNDROME  (NO ISSUES SINCE)   CATARACT EXTRACTION, BILATERAL     CERVICAL BIOPSY  W/ LOOP ELECTRODE EXCISION  2005   dr gottsegen   COLONOSCOPY     COLPOSCOPY     HYSTEROSCOPY WITH D & C  02/13/2012   Procedure: DILATATION AND CURETTAGE /HYSTEROSCOPY;  Surgeon: Bennetta Laos, MD;  Location: [facility];  Service: Gynecology;  Laterality: N/A;  deficit - 710   LAPAROSCOPIC CHOLECYSTECTOMY  05-16-2005  dr Brantley Stage [facility]   melanoma excised     SVT-CARDIAC ABLATION     TRANSTHORACIC ECHOCARDIOGRAM  12-10-2012   dr Wynonia Lawman   mild LVH, ef 55-60%/  mild MR/  mild LAE and RAE   TUBAL LIGATION     UPPER GI ENDOSCOPY     VULVECTOMY N/A 07/25/2017   Procedure: WIDE EXCISION VULVECTOMY;  Surgeon: Marti Sleigh, MD;  Location: [facility];  Service: Gynecology;  Laterality: N/A;    MEDICATIONS:  acetaminophen (TYLENOL) 500 MG tablet   ALPRAZolam (XANAX) 0.5 MG tablet   ammonium lactate (LAC-HYDRIN) 12 % lotion   amoxicillin-clavulanate (AUGMENTIN) 875-125 MG tablet   betamethasone valerate ointment (VALISONE) 0.1 %   cromolyn (GASTROCROM) 100 MG/5ML solution   Cyanocobalamin (VITAMIN B-12 PO)   diphenhydrAMINE (BENADRYL) 12.5 MG/5ML liquid    EPINEPHrine 0.3 mg/0.3 mL IJ SOAJ injection   estradiol (ESTRACE) 0.1 MG/GM vaginal cream   hydrocortisone (ANUSOL-HC) 2.5 % rectal cream   losartan-hydrochlorothiazide (HYZAAR) 50-12.5 MG tablet   omeprazole (PRILOSEC) 20 MG capsule   sodium chloride (OCEAN) 0.65 % SOLN nasal spray   VITAMIN A PO   VITAMIN D PO   No current facility-administered medications for this encounter.    Myra Gianotti, PA-C Surgical Short Stay/Anesthesiology [facility] Phone [phone] [facility] Phone [phone] 05/23/2021 4:38 PM

## 2021-05-23 NOTE — Therapy (Signed)
[facility] [place], [place], [address] Phone: [phone]   Fax:     Modified Barium Swallow  Patient Details  Name: Anna [name] MRN: [id number] Date of Birth: [birth year] No data recorded  Encounter Date: 05/23/2021   End of Session - 05/23/21 1518     Visit Number 1    Number of Visits 1    Date for SLP Re-Evaluation 05/23/21    SLP Start Time 1250    SLP Stop Time  3545    SLP Time Calculation (min) 25 min    Activity Tolerance Patient tolerated treatment well             Past Medical History:  Diagnosis Date   Acid reflux    Allergy to alpha-gal    Anxiety    Bilateral cataracts    Cancer ([place])    Basal Cell Carcinoma, Melanoma   CIN I (cervical intraepithelial neoplasia I)    Colon polyps    benign   Hemorrhoid    History of basal cell carcinoma (BCC) excision    History of gallstones    History of melanoma excision RIGHT LEG  --  2012   History of Wolff-Parkinson-White (WPW) syndrome    1997  s/p  ablation   Hypertension    Lyme disease    Mild carotid artery disease ([place])    per duplex 12-10-2012  bilateral ICA 1-39% stenosis   Sleep apnea    SVT (supraventricular tachycardia) ([place]) 10/31/2014    Past Surgical History:  Procedure Laterality Date   BREAST SURGERY Left 1999   BREAST MASS EXCISED, benign   CARDIAC ELECTROPHYSIOLOGY STUDY AND ABLATION  1997   SUCCESSFUL ABLATION OF WOLFE-PARKINSON-WHITE SYNDROME  (NO ISSUES SINCE)   CATARACT EXTRACTION, BILATERAL     CERVICAL BIOPSY  W/ LOOP ELECTRODE EXCISION  2005   dr gottsegen   COLONOSCOPY     COLPOSCOPY     HYSTEROSCOPY WITH D & C  02/13/2012   Procedure: DILATATION AND CURETTAGE /HYSTEROSCOPY;  Surgeon: Bennetta Laos, MD;  Location: [facility];  Service: Gynecology;  Laterality: N/A;  deficit - 710   LAPAROSCOPIC CHOLECYSTECTOMY  05-16-2005  dr Brantley [facility]   melanoma excised     SVT-CARDIAC  ABLATION     TRANSTHORACIC ECHOCARDIOGRAM  12-10-2012   dr Wynonia Lawman   mild LVH, ef 55-60%/  mild MR/  mild LAE and RAE   TUBAL LIGATION     UPPER GI ENDOSCOPY     VULVECTOMY N/A 07/25/2017   Procedure: WIDE EXCISION VULVECTOMY;  Surgeon: Marti Sleigh, MD;  Location[facility];  Service: Gynecology;  Laterality: N/A;    There were no vitals filed for this visit.   Subjective Assessment - 05/23/21 1506     Subjective "It's like my throat closes up."    Currently in Pain? No/denies               05/23/21 1500  SLP Visit Information  SLP Received On 05/23/21  Subjective  Subjective Pt reports relatively sudden onset of dysphagia in December, primarily solids but now has trouble with liquids at times  Pain Assessment  Pain Assessment No/denies pain (reports throat is mildly sore, but not painful)  General Information  Date of Onset 05/03/21 (referral date; symptom onset in December)  HPI Patient is a 76 y.o. female with past medical history including GERD, alpha gal, HTN, OSA, HLD, chronic UTI referred by  Dr. Pryor Ochoa for [facility] for difficulty swallowing. Dr. Pryor Ochoa noted resolving thrush vs possible lichen planus inflammation, reflux/LPR findings, dx with glossitis following rounds of antibiotics and antifungals. Mirror exam on 04/19/21 showed interarytenoid pachydermia, laryngeal edema.  Type of Study MBS-Modified Barium Swallow Study  Previous Swallow Assessment previously had EGD in 2021, pt reports that showed hiatal hernia. records are not available (through [facility])  Diet Prior to this Study Regular;Thin liquids  Temperature Spikes Noted No  Respiratory Status Room air  History of Recent Intubation No  Behavior/Cognition Alert;Cooperative  Oral Cavity Assessment Other (comment) (white/yellow coating of lingual surface, a few small bumps on posterior aspect)  Oral Care Completed by SLP No  Oral Cavity - Dentition Adequate natural dentition  Vision  Functional for self feeding  Self-Feeding Abilities Able to feed self  Patient Positioning Upright in chair  Baseline Vocal Quality Hoarse  Volitional Cough Strong  Volitional Swallow Able to elicit  Anatomy [facility]  Pharyngeal Secretions Not observed secondary MBS  Oral Motor/Sensory Function  Overall Oral Motor/Sensory Function WFL  Oral Preparation/Oral Phase  Oral Phase WFL  Pharyngeal Phase  Pharyngeal Phase WFL  Cervical Esophageal Phase  Cervical Esophageal Phase WFL  Cervical Esophageal Phase - Thin  Thin Cup Prominent cricopharyngeal segment  Clinical Impression  Clinical Impression Patient presents with normal oropharyngeal swallow and suspected primary esophageal dysphagia. Oral stage is characterized by adequate lip closure, bolus preparation, and anterior to posterior transit. Swallow initiation is timely at the level of the base of tongue. Pharyngeal stage is noted for adequate tongue base retraction, hyolaryngeal excursion, and pharyngeal constriction. Epiglottic deflection is complete; there is no penetration or aspiration. Pharyngeal stripping wave is complete with no abnormal pharyngeal residue. Duration of pharyngoesophageal segment opening is WFL; prominent cricopharyngeus is confirmed by the radiologist. An esophageal sweep in the upright position was performed with thin liquids and noted for to and fro movements, possible reflux or dysmotility per radiology PA. SLP educated on reflux modifications and provided handout with this information. Patient would benefit from GI referral/ further assessment of esophageal function. Recommend patient continue regular diet with thin liquids; no further ST indicated.  SLP Visit Diagnosis Dysphagia, pharyngoesophageal phase (R13.14)  Impact on safety and function Mild aspiration risk  Swallow Evaluation Recommendations  Recommended Consults Consider GI evaluation;Consider esophageal assessment  SLP Diet Recommendations Regular solids;Thin  liquid  Liquid Administration via Cup;Straw  Medication Administration Whole meds with liquid  Supervision Patient able to self feed  Compensations Slow rate;Small sips/bites;Follow solids with liquid  Postural Changes Seated upright at 90 degrees;Remain semi-upright after after feeds/meals (Comment)  Treatment Plan  Oral Care Recommendations Oral care BID  Treatment Recommendations No treatment recommended at this time  Follow Up Recommendations No SLP follow up  Individuals Consulted  Consulted and Agree with Results and Recommendations Patient  Report Sent to  Referring physician;Other (comment) (PCP)  Progression Toward Goals  Progression toward goals Goals met, education completed, patient discharged from SLP  SLP Time Calculation  SLP Start Time (ACUTE ONLY) 1250  SLP Stop Time (ACUTE ONLY) 1315  SLP Time Calculation (min) (ACUTE ONLY) 25 min  SLP Evaluations  $ SLP Speech Visit 1 Visit  SLP Evaluations  $Outpatient MBS Swallow 1 Procedure     Dysphagia, unspecified type - Plan: DG SWALLOW FUNC OP MEDICARE SPEECH PATH, DG SWALLOW FUNC OP MEDICARE SPEECH PATH  Feeding difficulties - Plan: DG SWALLOW FUNC OP MEDICARE SPEECH PATH, DG SWALLOW FUNC OP MEDICARE SPEECH PATH  Problem List Patient Active Problem List   Diagnosis Date Noted   Recurrent UTI 05/13/2021   Post-menopausal bleeding 05/13/2021   Gastroesophageal reflux disease 03/13/2021   Hematochezia 03/13/2021   Personal history of colonic polyps 03/13/2021   Rectal bleeding 03/13/2021   Allergic reaction 03/13/2021   OSA (obstructive sleep apnea) 11/09/2020   Edema 09/20/2020   Essential hypertension 09/20/2020   B12 deficiency 06/24/2020   Vitamin D deficiency 06/24/2020   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   Allergy to alpha-gal 06/20/2020   Intestinal malabsorption 06/20/2020   Irritable bowel syndrome with constipation 01/04/2020   Prolapsed internal hemorrhoids, grade 4 01/04/2020    Internal and external bleeding hemorrhoids 01/04/2020   Melanoma ([place]) 03/22/2019   Heart palpitations 03/22/2019   Hyperlipidemia 04/27/2014   Chronic osteoarthritis 04/27/2014   Anxiety attack 12/10/2012   Deneise Lever, MS, CCC-SLP Speech-Language Pathologist ([place], [place] 05/23/2021, 3:19 PM  [facility] [place], [place], [address] Phone: [phone]   Fax:     Name: Anna [name] MRN: [id number] Date of Birth: 1945-05-21

## 2021-05-23 NOTE — Anesthesia Preprocedure Evaluation (Deleted)
Anesthesia Evaluation    Airway        Dental   Pulmonary           Cardiovascular hypertension,      Neuro/Psych    GI/Hepatic   Endo/Other    Renal/GU      Musculoskeletal   Abdominal   Peds  Hematology   Anesthesia Other Findings   Reproductive/Obstetrics                             Anesthesia Physical Anesthesia Plan  ASA:   Anesthesia Plan:    Post-op Pain Management:    Induction:   PONV Risk Score and Plan:   Airway Management Planned:   Additional Equipment:   Intra-op Plan:   Post-operative Plan:   Informed Consent:   Plan Discussed with:   Anesthesia Plan Comments: (PAT note written 05/23/2021 by Myra Gianotti, PA-C. )        Anesthesia Quick Evaluation

## 2021-05-24 ENCOUNTER — Telehealth: Payer: Self-pay | Admitting: *Deleted

## 2021-05-24 NOTE — Telephone Encounter (Signed)
Spoke with patient.  Patient request to cancel surgery. Seen by Cardiology today, Echo scheduled for 2/27, stress test on 3/8. She will f/u with cardiology on 3/23. Will f/u with patient to reschedule once cardiac clearance received. Patient agreeable.   Call placed to Central scheduling, spoke with Colletta [place], case cancelled.   Will keep surgery chart for f/u.   Routing to provider for final review. Patient is agreeable to disposition. Will close encounter.  Cc: Hayley Carder

## 2021-05-24 NOTE — Telephone Encounter (Signed)
-----   Message from Salvadore Dom, MD sent at 05/22/2021  5:26 PM EST ----- Her EKG is not normal. Please check with Anesthesia about reviewing the EKG and see if she needs further evaluation prior to surgery?

## 2021-05-24 NOTE — Telephone Encounter (Signed)
Burnice Logan, RN  05/24/2021  8:53 AM EST Back to Top    Call placed to [facility] at [phone], Left message to call Sharee Pimple, [place] at [place], [phone]. Advised per Dr. Talbert Nan.    Burnice Logan, RN  05/23/2021  8:33 AM EST     Message sent via Epic to [facility] PAT requesting anesthesia to review.    Salvadore Dom, MD  05/22/2021  5:26 PM EST     Her EKG is not normal. Please check with Anesthesia about reviewing the EKG and see if she needs further evaluation prior to surgery?   Anesthesia returned call: Ebony Hail, [place] left message advising EKG stable from comparison to previous EKG in 2019.  Per review of Epic, patient has requested appt with Dr. Ubaldo Glassing regarding abnormal EKG.   Call placed to [facility], spoke with [facility]. Was advised patient is scheduled for OV today at 1:45pm. Surgery clearance requested.   Call placed to patient, advised as seen above. Patient has some additional symptoms that she wants to discuss with Cardiology. Patient will f/u after her appt today.   Call placed to [place], [place] -update provided.

## 2021-05-25 ENCOUNTER — Other Ambulatory Visit: Payer: Self-pay

## 2021-05-28 ENCOUNTER — Ambulatory Visit (HOSPITAL_COMMUNITY)
Admission: RE | Admit: 2021-05-28 | Payer: Medicare Other | Source: Home / Self Care | Admitting: Obstetrics and Gynecology

## 2021-05-28 ENCOUNTER — Encounter (HOSPITAL_COMMUNITY): Admission: RE | Payer: Self-pay | Source: Home / Self Care

## 2021-05-28 ENCOUNTER — Telehealth: Payer: Self-pay

## 2021-05-28 SURGERY — DILATATION & CURETTAGE/HYSTEROSCOPY WITH MYOSURE
Anesthesia: Choice

## 2021-05-28 NOTE — Telephone Encounter (Signed)
CALLED PATIENT NO ANSWER LEFT VOICEMAIL FOR A CALL BACK ? ?

## 2021-05-29 ENCOUNTER — Telehealth: Payer: Self-pay

## 2021-05-29 NOTE — Telephone Encounter (Signed)
Patient refused appointment with [name] stated she already has a provider for the gerd

## 2021-06-04 ENCOUNTER — Encounter: Payer: Medicare Other | Admitting: Obstetrics and Gynecology

## 2021-06-22 ENCOUNTER — Ambulatory Visit (INDEPENDENT_AMBULATORY_CARE_PROVIDER_SITE_OTHER): Payer: Medicare Other | Admitting: Internal Medicine

## 2021-06-22 ENCOUNTER — Other Ambulatory Visit: Payer: Self-pay

## 2021-06-22 ENCOUNTER — Encounter: Payer: Self-pay | Admitting: Internal Medicine

## 2021-06-22 DIAGNOSIS — K909 Intestinal malabsorption, unspecified: Secondary | ICD-10-CM

## 2021-06-22 DIAGNOSIS — E7849 Other hyperlipidemia: Secondary | ICD-10-CM

## 2021-06-22 DIAGNOSIS — E538 Deficiency of other specified B group vitamins: Secondary | ICD-10-CM

## 2021-06-22 DIAGNOSIS — D649 Anemia, unspecified: Secondary | ICD-10-CM

## 2021-06-22 DIAGNOSIS — I10 Essential (primary) hypertension: Secondary | ICD-10-CM

## 2021-06-22 DIAGNOSIS — N950 Postmenopausal bleeding: Secondary | ICD-10-CM

## 2021-06-22 DIAGNOSIS — R5383 Other fatigue: Secondary | ICD-10-CM | POA: Diagnosis not present

## 2021-06-22 DIAGNOSIS — Z86010 Personal history of colonic polyps: Secondary | ICD-10-CM

## 2021-06-22 DIAGNOSIS — Z91018 Allergy to other foods: Secondary | ICD-10-CM

## 2021-06-22 DIAGNOSIS — R002 Palpitations: Secondary | ICD-10-CM | POA: Diagnosis not present

## 2021-06-22 DIAGNOSIS — F409 Phobic anxiety disorder, unspecified: Secondary | ICD-10-CM

## 2021-06-22 DIAGNOSIS — F5105 Insomnia due to other mental disorder: Secondary | ICD-10-CM

## 2021-06-22 DIAGNOSIS — C4361 Malignant melanoma of right upper limb, including shoulder: Secondary | ICD-10-CM

## 2021-06-22 DIAGNOSIS — K2100 Gastro-esophageal reflux disease with esophagitis, without bleeding: Secondary | ICD-10-CM

## 2021-06-22 DIAGNOSIS — E559 Vitamin D deficiency, unspecified: Secondary | ICD-10-CM

## 2021-06-22 MED ORDER — TRAZODONE HCL 50 MG PO TABS
25.0000 mg | ORAL_TABLET | Freq: Every evening | ORAL | 3 refills | Status: DC | PRN
Start: 2021-06-22 — End: 2021-09-10

## 2021-06-22 MED ORDER — ALPRAZOLAM 0.25 MG PO TABS: ORAL_TABLET | ORAL | 5 refills | Status: DC

## 2021-06-22 MED ORDER — ZOSTER VAC RECOMB ADJUVANTED 50 MCG/0.5ML IM SUSR: 0.5000 mL | Freq: Once | INTRAMUSCULAR | 1 refills | Status: AC

## 2021-06-22 NOTE — Assessment & Plan Note (Signed)
June 06 2021 normal treadmill stress test ([place]) EF 69%  ?

## 2021-06-22 NOTE — Patient Instructions (Addendum)
Once you have closed your deals,  WE'LL reduce your alprazolam (every 6 months)  ? ? ?Start the trazodone at 50 mg one hour before bedtime.   ? ?At your usual time for alprazolam, you will take 0.375 mg  (1.5 tablets of the lower dose ) instad of 0.5 mg , using THE LOWER DOSE TABLETS I SENT IN ? ? ?I recommend that you have  Shingles vaccine.  I have given you prescription  for this because they will be FREE at your local pharmacy  because Medicare is paying for them now  ?

## 2021-06-22 NOTE — Progress Notes (Signed)
? ?Subjective:  ?Patient ID: Anna [name], female    DOB: 05-Jun-1945  Age: 76 y.o. MRN: [id number] ? 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 ? ? ?This visit occurred during the SARS-CoV-2 public health emergency.  Safety protocols were in place, including screening questions prior to the visit, additional usage of staff PPE, and extensive cleaning of exam room while observing appropriate contact time as indicated for disinfecting solutions.   ? ?HPI ?Anna [name] Anna [name] presents follow up on hypertension and other chronic issues ?Chief Complaint  ?Patient presents with  ? Follow-up  ?  6 mo f/u - hypertension. Pt reports feeling better on medication.  ? ?She feels significantly better since her last visit.  ? ?1) HTN:  Patient is taking her losartan/hctz as prescribed and notes no adverse effects.  Home BP readings have been done about once per week and are  generally < 130/80 .  She is avoiding added salt in her diet.  She recently underwent a treadmill stress test by Dr Anna [name], her cardiologist and was surprised at the level of deconditioning (she was only able to walk for 4 minutes due to fatigue.)  she has decided to resume a walking program prior to engaging in pickle ball  .  ? ?2) chronic insomnia,  has been using low dose of alprazolam 0.5 mg qhs  since 1996.  She would like to get off of it.  On the infrequent occasions where she has fallen asleep without it,  she wakes up with withdrawal sympotms  (Heart pounds, panic attack)    She has had no  prior trial of trazodone or lunesta. ? ?3) GAD:  improving with successful resolution of several work related projects.  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 ? ?4) Glossitis:  eval by ENT reviewed.  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 Referred to GI and MBSS was  ordered to evaluate c/o dysphagia WAS NORMAL  in late Feb,  prominent CP muscle noted.  .  Taking omeprazole 20 mg daily . She declined appt with Dr  Allen Norris because she sees [facility] and had EGD/colonoscopy in 2021 and her glossitis has resolved clinically  ? ?Outpatient Medications Prior to Visit  ?Medication Sig Dispense Refill  ? acetaminophen (TYLENOL) 500 MG tablet Take 1,000 mg by mouth every 6 (six) hours as needed for moderate pain.    ? ammonium lactate (LAC-HYDRIN) 12 % lotion Apply 1 application topically daily.    ? betamethasone valerate ointment (VALISONE) 0.1 % Apply 1 application topically 2 (two) times daily. Use for up to 2 weeks as needed (Patient taking differently: Apply 1 application topically daily as needed (itching/ urinary pain).) 30 g 0  ? cromolyn (GASTROCROM) 100 MG/5ML solution Take 100 mg by mouth 4 (four) times daily as needed (alphagal symptoms).    ? Cyanocobalamin (VITAMIN B-12 PO) Take 1 Dose by mouth daily.    ? diphenhydrAMINE (BENADRYL) 12.5 MG/5ML liquid Take 37.5 mg by mouth 4 (four) times daily as needed for allergies.    ?  EPINEPHrine 0.3 mg/0.3 mL IJ SOAJ injection Use as directed for severe allergic reaction 2 Device 2  ? estradiol (ESTRACE) 0.1 MG/GM vaginal cream 1 gram vaginally twice weekly (Patient taking differently: Place 1 Applicatorful vaginally 3 (three) times a week.) 42.5 g 1  ? hydrocortisone (ANUSOL-HC) 2.5 % rectal cream Place 1 application rectally daily as needed for hemorrhoids.    ? losartan-hydrochlorothiazide (HYZAAR) 50-12.5 MG tablet TAKE 1 TABLET BY MOUTH EVERY  DAY 90 tablet 1  ? omeprazole (PRILOSEC) 20 MG capsule Take 20 mg by mouth every morning.    ? sodium chloride (OCEAN) 0.65 % SOLN nasal spray Place 1 spray into both nostrils as needed (dryness).    ? VITAMIN A PO Take 1 capsule by mouth daily.    ? VITAMIN D PO Take 1 capsule by mouth daily.    ? ALPRAZolam (XANAX) 0.5 MG tablet TAKE 1 TABLET BY MOUTH EVERY DAY AT BEDTIME AS NEEDED (Patient taking differently: Take 0.5 mg by mouth at bedtime.) 30 tablet 4  ? amoxicillin-clavulanate (AUGMENTIN) 875-125 MG tablet Take 1 tablet by mouth 2 (two) times daily. (Patient not taking: Reported on 05/21/2021) 10 tablet 0  ? ?No facility-administered medications prior to visit.  ? ? ?Review of Systems; ? ?Patient denies headache, fevers, malaise, unintentional weight loss, skin rash, eye pain, sinus congestion and sinus pain, sore throat, dysphagia,  hemoptysis , cough, dyspnea, wheezing, chest pain, palpitations, orthopnea, edema, abdominal pain, nausea, melena, diarrhea, constipation, flank pain, dysuria, hematuria, urinary  Frequency, nocturia, numbness, tingling, seizures,  Focal weakness, Loss of consciousness,  Tremor, insomnia, depression, anxiety, and suicidal ideation.   ? ? ? ?Objective:  ?BP 122/78 (BP Location: Left Arm, Patient Position: Sitting, Cuff Size: Large)   Pulse 75   Temp 98.5 ?F (36.9 ?C) (Oral)   Ht '5\' 8"'$  (1.727 m)   Wt 207 lb (93.9 kg)   SpO2 99%   BMI 31.47 kg/m?  ? ?BP Readings from Last 3 Encounters:  ?06/22/21 122/78  ?05/22/21 (!) 158/66  ?05/11/21 126/86  ? ? ?Wt Readings from Last 3 Encounters:  ?06/22/21 207 lb (93.9 kg)  ?05/22/21 206 lb 12.8 oz (93.8 kg)  ?05/11/21 207 lb 9.6 oz (94.2 kg)  ? ? ?General appearance: alert, cooperative and appears stated age ?Ears: normal TM's and external ear canals both ears ?Throat: lips, mucosa, and tongue normal; teeth and gums normal ?Neck: no adenopathy, no carotid bruit, supple, symmetrical, trachea midline and thyroid not enlarged, symmetric, no  tenderness/mass/nodules ?Back: symmetric, no curvature. ROM normal. No CVA tenderness. ?Lungs: clear to auscultation bilaterally ?Heart: regular rate and rhythm, S1, S2 normal, no murmur, click, rub or gallop ?Abdomen: soft, non-tender; bowel sounds normal; no masses,  no organomegaly ?Pulses: 2+ and symmetric ?Skin: Skin color, texture, turgor normal. No rashes or lesions ?Lymph nodes: Cervical, supraclavicular, and axillary nodes normal. ? ?No results found for: HGBA1C ? ?Lab Results  ?Component Value Date  ? CREATININE 0.85 05/22/2021  ? CREATININE 0.91 12/22/2020  ? CREATININE 0.88 09/19/2020  ? ? ?Lab Results  ?Component Value Date  ? WBC 8.2 05/22/2021  ? HGB 13.4 05/22/2021  ? HCT 40.0 05/22/2021  ? PLT 321 05/22/2021  ? GLUCOSE 98 05/22/2021  ? CHOL 201 (H) 12/10/2012  ? TRIG 77 12/10/2012  ? HDL 47 12/10/2012  ? LDLCALC 139 (H) 12/10/2012  ? ALT 13 06/19/2020  ? AST 13 06/19/2020  ? NA 141 05/22/2021  ? K 3.5 05/22/2021  ? CL 103 05/22/2021  ?  CREATININE 0.85 05/22/2021  ? BUN 8 05/22/2021  ? CO2 29 05/22/2021  ? TSH 2.17 09/19/2020  ? INR 1.03 12/10/2012  ? MICROALBUR 4.2 (H) 09/19/2020  ? ? ?DG SWALLOW FUNC OP MEDICARE SPEECH PATH ? ?Result Date: 05/23/2021 ?Table formatting from the original result was not included. CLINICAL DATA:  Oropharyngeal dysphagia. EXAM: MODIFIED BARIUM SWALLOW TECHNIQUE: Different consistencies of barium were administered orally to the patient by the Speech Pathologist. Imaging of the pharynx was performed in the lateral projection. The radiology advanced practice provider was present in the fluoroscopy room for this study, providing personal supervision. FLUOROSCOPY TIME:  Radiation Exposure Index (if provided by the fluoroscopic device): 17.40 mGy Number of Acquired Spot Images: 0 COMPARISON:  None. FINDINGS: Real-time fluoroscopy of the swallowing function was performed with a speech pathologist present. Multiple consistencies of barium were administered which included water,  nectar, applesauce, and Graham cracker. Normal swallow function. There is prominence of the cricopharyngeal muscle. No laryngeal penetration or tracheal aspiration. IMPRESSION: Modified barium swallow as desc

## 2021-06-22 NOTE — Assessment & Plan Note (Addendum)
Well controlled on current regimen of losartan/hct at 50/12.5 mg dose. Lytes and  Renal function are normal,  no changes today. Discussed the potential of eliminating hct in the future for months of incrased outdoor physical activity to reduce risk of mucle cramping from electloyte disturbances.  ?

## 2021-06-23 NOTE — Assessment & Plan Note (Addendum)
Her hysteroscopy and D&C was  postponed and will now move forward since she has had cardiac clearance by Dr Ubaldo Glassing ?

## 2021-06-23 NOTE — Assessment & Plan Note (Signed)
Last colonoscopy was done in 2021 by [facility].  Records have been requested ?

## 2021-06-23 NOTE — Assessment & Plan Note (Signed)
She has been alprazolam dependent since 1999 without dose escalation and wants to wean off of the medication due to the withdrawal type reaction she has had when she misses a dose . Discussed the merits of a very slow taper.  Will start trazodone at 50 mg daily and reduce alprazolam  dose to 0.375 mg .  New rx for #60 0.25 mg tablets sent to pharmacy ?

## 2021-06-23 NOTE — Assessment & Plan Note (Signed)
She has had 3 surgical excisions, MOST RECENTLY Feb 16 from RUE by [place]  ?

## 2021-06-23 NOTE — Assessment & Plan Note (Addendum)
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  Alpha gal reaction reported by patient;  Ige level was very elevated at 0.76  In March 2022  . Managed by [facility] ; oral  Gastrocrom and oral  ketotifen compounded have been prescribed.    Screen for vitamin deficiencies given the restriction of food she has maintained   is positive for  B12 deficiency ?

## 2021-06-23 NOTE — Assessment & Plan Note (Signed)
Secondary to alpha gal allergy.  Continue life long supplementation  ? ?Lab Results  ?Component Value Date  ? [id number] 288 09/19/2020  ? ? ?

## 2021-06-23 NOTE — Assessment & Plan Note (Signed)
Chronic,  Aggravated by sun avoidance due to history of melanoma  ?

## 2021-06-23 NOTE — Assessment & Plan Note (Signed)
Managed with omeprazole 20 mg daily. Last EGD 2021 by [facility].  records requested ?

## 2021-06-26 ENCOUNTER — Telehealth: Payer: Self-pay | Admitting: *Deleted

## 2021-06-26 NOTE — Telephone Encounter (Signed)
Call placed to patient to f/u on surgery recommended at last OV 05/03/21. Per review of Epic, patient has completed f/u with cardiology.  ? ?Spoke with patient, patient states she is unable to talk right now, will return call at a later time.  ?
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
## 2021-07-24 LAB — HM MAMMOGRAPHY

## 2021-08-04 ENCOUNTER — Encounter: Payer: Self-pay | Admitting: Emergency Medicine

## 2021-08-04 ENCOUNTER — Ambulatory Visit
Admission: EM | Admit: 2021-08-04 | Discharge: 2021-08-04 | Disposition: A | Discharge status: Home / Self Care | Payer: Medicare Other | Attending: Emergency Medicine | Admitting: Emergency Medicine

## 2021-08-04 DIAGNOSIS — N898 Other specified noninflammatory disorders of vagina: Secondary | ICD-10-CM

## 2021-08-04 DIAGNOSIS — R300 Dysuria: Secondary | ICD-10-CM

## 2021-08-04 DIAGNOSIS — N390 Urinary tract infection, site not specified: Secondary | ICD-10-CM | POA: Insufficient documentation

## 2021-08-04 LAB — POCT URINALYSIS DIP (MANUAL ENTRY)
Bilirubin, UA: NEGATIVE
Blood, UA: NEGATIVE
Glucose, UA: NEGATIVE mg/dL
Ketones, POC UA: NEGATIVE mg/dL
Nitrite, UA: POSITIVE — AB
Protein Ur, POC: NEGATIVE mg/dL
Spec Grav, UA: 1.02 (ref 1.010–1.025)
Urobilinogen, UA: 0.2 E.U./dL
pH, UA: 6 (ref 5.0–8.0)

## 2021-08-04 MED ORDER — CEPHALEXIN 500 MG PO CAPS: 500.0000 mg | ORAL_CAPSULE | Freq: Two times a day (BID) | ORAL | 0 refills | Status: AC

## 2021-08-04 MED ORDER — FLUCONAZOLE 150 MG PO TABS: 150.0000 mg | ORAL_TABLET | Freq: Every day | ORAL | 0 refills | Status: DC

## 2021-08-04 NOTE — ED Provider Notes (Signed)
?[facility] ? ? ? ?CSN: [id number] ?Arrival date & time: 08/04/21  1010 ? ? ?  ? ?History   ?Chief Complaint ?Chief Complaint  ?Patient presents with  ? Vaginal Irritation  ? Dysuria  ? Abdominal Pain  ? ? ?HPI ?Anna [name] is a 76 y.o. female.  Patient presents with 1 week history of dysuria, urinary frequency, vaginal itching, vaginal irritation.  She denies fever, chills, abdominal pain, flank pain, vaginal discharge, pelvic pain, or other symptoms.  No treatments at home.  Her medical history includes postmenopausal bleeding, atrophic vaginitis, chronic vulvitis, yeast vaginitis, recurrent UTIs, hypertension, Wolff-Parkinson-White syndrome, GERD, IBS, melanoma, basal cell carcinoma. ? ?The history is provided by the patient and medical records.  ? ?Past Medical History:  ?Diagnosis Date  ? Acid reflux   ? Allergy to alpha-gal   ? Anxiety   ? Bilateral cataracts   ? [facility]   ? Basal Cell Carcinoma, Melanoma  ? CIN I (cervical intraepithelial neoplasia I)   ? Colon polyps   ? benign  ? Hemorrhoid   ? History of basal cell carcinoma (BCC) excision   ? History of gallstones   ? History of melanoma excision RIGHT LEG  --  2012  ? History of Wolff-Parkinson-White (WPW) syndrome   ? 1997  s/p  ablation  ? Hypertension   ? Lyme disease   ? Mild carotid artery disease ([place])   ? per duplex 12-10-2012  bilateral ICA 1-39% stenosis  ? Sleep apnea   ? SVT (supraventricular tachycardia) ([place]) 10/31/2014  ? ? ?Patient Active Problem List  ? Diagnosis Date Noted  ? Post-menopausal bleeding 05/13/2021  ? Gastroesophageal reflux disease 03/13/2021  ? Hematochezia 03/13/2021  ? Personal history of colonic polyps 03/13/2021  ? OSA (obstructive sleep apnea) 11/09/2020  ? Edema 09/20/2020  ? Essential hypertension 09/20/2020  ? B12 deficiency 06/24/2020  ? Vitamin D deficiency 06/24/2020  ? 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 Allergy to alpha-gal 06/20/2020  ? Intestinal malabsorption 06/20/2020  ?  Irritable bowel syndrome with constipation 01/04/2020  ? Prolapsed internal hemorrhoids, grade 4 01/04/2020  ? Internal and external bleeding hemorrhoids 01/04/2020  ? Melanoma ([place]) 03/22/2019  ? Heart palpitations 03/22/2019  ? Hyperlipidemia 04/27/2014  ? Chronic osteoarthritis 04/27/2014  ? ? ?Past Surgical History:  ?Procedure Laterality Date  ? BREAST SURGERY Left 1999  ? BREAST MASS EXCISED, benign  ? CARDIAC ELECTROPHYSIOLOGY STUDY AND ABLATION  1997  ? SUCCESSFUL ABLATION OF WOLFE-PARKINSON-WHITE SYNDROME  (NO ISSUES SINCE)  ? CATARACT EXTRACTION, BILATERAL    ? CERVICAL BIOPSY  W/ LOOP ELECTRODE EXCISION  2005   dr Cherylann Banas  ? COLONOSCOPY    ? COLPOSCOPY    ? HYSTEROSCOPY WITH D & C  02/13/2012  ? Procedure: DILATATION AND CURETTAGE /HYSTEROSCOPY;  Surgeon: Bennetta Laos, MD;  Location: [facility];  Service: Gynecology;  Laterality: N/A;  deficit - 710  ? LAPAROSCOPIC CHOLECYSTECTOMY  05-16-2005  dr Brantley Stage [facility]  ? melanoma excised    ? SVT-CARDIAC ABLATION    ? TRANSTHORACIC ECHOCARDIOGRAM  12-10-2012   dr Wynonia Lawman  ? mild LVH, ef 55-60%/  mild MR/  mild LAE and RAE  ? TUBAL LIGATION    ? UPPER GI ENDOSCOPY    ? VULVECTOMY N/A 07/25/2017  ? Procedure: WIDE EXCISION VULVECTOMY;  Surgeon: Marti Sleigh, MD;  Location: [facility];  Service: Gynecology;  Laterality: N/A;  ? ? ?OB History   ? ?  Gravida  ?2  ? Para  ?2  ? Term  ?2  ? Preterm  ?   ? AB  ?   ? Living  ?2  ?  ? ? SAB  ?   ? IAB  ?   ? Ectopic  ?   ? Multiple  ?   ? Live Births  ?   ?   ?  ?  ? ? ? ?Home Medications   ? ?Prior to Admission medications   ?Medication Sig Start Date End Date Taking? Authorizing Provider  ?cephALEXin (KEFLEX) 500 MG capsule Take 1 capsule (500 mg total) by mouth 2 (two) times daily for 5 days. 08/04/21 08/09/21 Yes Sharion Balloon, NP  ?fluconazole (DIFLUCAN) 150 MG tablet Take 1 tablet (150 mg total) by mouth daily. Take one tablet today.  May repeat in 3 days. 08/04/21  Yes Sharion Balloon, NP  ?acetaminophen (TYLENOL) 500 MG tablet Take 1,000 mg by mouth every 6 (six) hours as needed for moderate pain.    [provider]  ?ALPRAZolam Duanne Moron) 0.25 MG tablet 1 to 2 tablets at bedtime as needed 06/22/21   Crecencio Mc, MD  ?ammonium lactate (LAC-HYDRIN) 12 % lotion Apply 1 application topically daily. 05/14/21   [provider]  ?betamethasone valerate ointment (VALISONE) 0.1 % Apply 1 application topically 2 (two) times daily. Use for up to 2 weeks as needed ?Patient taking differently: Apply 1 application topically daily as needed (itching/ urinary pain). 04/06/21   Salvadore Dom, MD  ?cromolyn (GASTROCROM) 100 MG/5ML solution Take 100 mg by mouth 4 (four) times daily as needed (alphagal symptoms).    [provider]  ?Cyanocobalamin (VITAMIN B-12 PO) Take 1 Dose by mouth daily.    [provider]  ?diphenhydrAMINE (BENADRYL) 12.5 MG/5ML liquid Take 37.5 mg by mouth 4 (four) times daily as needed for allergies.    [provider]  ?EPINEPHrine 0.3 mg/0.3 mL IJ SOAJ injection Use as directed for severe allergic reaction 06/10/18   Kennith Gain, MD  ?estradiol (ESTRACE) 0.1 MG/GM vaginal cream 1 gram vaginally twice weekly ?Patient taking differently: Place 1 Applicatorful vaginally 3 (three) times a week. 04/06/21   Salvadore Dom, MD  ?hydrocortisone (ANUSOL-HC) 2.5 % rectal cream Place 1 application rectally daily as needed for hemorrhoids. 07/15/20   [provider]  ?losartan-hydrochlorothiazide (HYZAAR) 50-12.5 MG tablet TAKE 1 TABLET BY MOUTH EVERY DAY 02/13/21   Crecencio Mc, MD  ?omeprazole (PRILOSEC) 20 MG capsule Take 20 mg by mouth every morning. 04/26/21   [provider]  ?sodium chloride (OCEAN) 0.65 % SOLN nasal spray Place 1 spray into both nostrils as needed (dryness).    [provider]  ?traZODone (DESYREL) 50 MG tablet Take 0.5-1 tablets (25-50 mg total) by mouth at bedtime as  needed for sleep. 06/22/21   Crecencio Mc, MD  ?VITAMIN A PO Take 1 capsule by mouth daily.    [provider]  ?VITAMIN D PO Take 1 capsule by mouth daily.    [provider]  ? ? ?Family History ?Family History  ?Problem Relation Age of Onset  ? Cancer Mother   ?     ESOPHAGEAL CANCER  ? Diabetes Father   ? Hyperlipidemia Father   ? Heart disease Father   ? Heart attack Father   ? Heart disease Brother   ? Breast cancer Paternal Aunt   ?     Age 38's  ?  Hyperlipidemia Paternal Grandmother   ? Heart disease Paternal Grandmother   ? Stroke Paternal Grandmother   ? Diabetes Paternal Grandfather   ? Hyperlipidemia Paternal Grandfather   ? Heart disease Paternal Grandfather   ? Stroke Paternal Grandfather   ? Cancer Sister   ?     skin  ? Stroke Maternal Grandmother   ? Stroke Maternal Grandfather   ? Diabetes Brother   ? ? ?Social History ?Social History  ? ?Tobacco Use  ? Smoking status: Never  ? Smokeless tobacco: Never  ?Vaping Use  ? Vaping Use: Never used  ?Substance Use Topics  ? Alcohol use: Not Currently  ? Drug use: No  ? ? ? ?Allergies   ?Iodinated contrast media, Alpha-gal, Gluten meal, Milk-related compounds, Shellfish allergy, Sulfa antibiotics, Azithromycin, and Wheat bran ? ? ?Review of Systems ?Review of Systems  ?Constitutional:  Negative for chills and fever.  ?Gastrointestinal:  Negative for abdominal pain, nausea and vomiting.  ?Genitourinary:  Positive for dysuria and frequency. Negative for flank pain, hematuria, pelvic pain and vaginal discharge.  ?     Vaginal itching  ?Skin:  Negative for color change and rash.  ?All other systems reviewed and are negative. ? ? ?Physical Exam ?Triage Vital Signs ?ED Triage Vitals  ?Enc Vitals Group  ?   BP   ?   Pulse   ?   Resp   ?   Temp   ?   Temp src   ?   SpO2   ?   Weight   ?   Height   ?   Head Circumference   ?   Peak Flow   ?   Pain Score   ?   Pain Loc   ?   Pain Edu?   ?   Excl. in [place]?   ? ?No data found. ? ?Updated Vital  Signs ?BP (!) 143/76   Pulse 98   Temp 98.2 ?F (36.8 ?C)   Resp 18   SpO2 98%  ? ?Visual Acuity ?Right Eye Distance:   ?Left Eye Distance:   ?Bilateral Distance:   ? ?Right Eye Near:   ?Left Eye Near:    ?Bilateral

## 2021-08-04 NOTE — ED Triage Notes (Signed)
Pt here with vaginal burning and burning during urination x 1 week. Pt also endorses lower abdominal pain but denies any vaginal discharge.  ?

## 2021-08-04 NOTE — Discharge Instructions (Addendum)
Take the antibiotic as directed.  The urine culture is pending.  We will call you if it shows the need to change or discontinue your antibiotic.   ? ?Take the Diflucan as directed.   ? ?Follow up with your primary care provider if your symptoms are not improving.   ? ?

## 2021-08-06 LAB — CERVICOVAGINAL ANCILLARY ONLY
Candida Glabrata: NEGATIVE
Candida Vaginitis: NEGATIVE
Comment: NEGATIVE
Comment: NEGATIVE

## 2021-08-07 LAB — URINE CULTURE: Culture: >=100000 — AB

## 2021-08-10 NOTE — Telephone Encounter (Signed)
Left message to call Venus Ruhe, RN at [facility], [phone].  

## 2021-08-21 NOTE — Telephone Encounter (Signed)
No return call from patient.   Routing to Dr. Talbert Nan.

## 2021-08-22 NOTE — Telephone Encounter (Signed)
Please send the patient a letter encouraging her to get care here or elsewhere. Having postmenopausal bleeding and a thickened endometrium can be a sign of endometrial cancer and shouldn't be ignored. Then close the encounter.

## 2021-08-23 NOTE — Telephone Encounter (Signed)
Patient returned call, left message requesting return call to reschedule surgery.   Left message to call Sharee Pimple, RN at [facility], [phone].

## 2021-08-24 ENCOUNTER — Ambulatory Visit (INDEPENDENT_AMBULATORY_CARE_PROVIDER_SITE_OTHER): Payer: Medicare Other

## 2021-08-24 VITALS — Ht 68.0 in | Wt 207.0 lb

## 2021-08-24 DIAGNOSIS — Z0000 Encounter for general adult medical examination without abnormal findings: Secondary | ICD-10-CM

## 2021-08-24 NOTE — Progress Notes (Signed)
Subjective:   Anna [name] is a 76 y.o. female who presents for an Initial Medicare Annual Wellness Visit.  Review of Systems    No ROS.  Medicare Wellness Virtual Visit.  Visual/audio telehealth visit, UTA vital signs.   See social history for additional risk factors.   Cardiac Risk Factors include: advanced age (>63mn, >>34women)     Objective:    Today's Vitals   08/24/21 0905  Weight: 207 lb (93.9 kg)  Height: '5\' 8"'$  (1.727 m)   Body mass index is 31.47 kg/m.     08/24/2021    9:15 AM 05/22/2021    3:22 PM 12/17/2020   11:27 AM 06/02/2020    4:51 PM 08/12/2017    2:50 PM 07/25/2017    7:15 AM 05/09/2017    3:37 PM  Advanced Directives  Does Patient Have a Medical Advance Directive? Yes Yes No No Yes Yes Yes  Type of Advance Directive Living will [place] will   Living will Living will Living will  Does patient want to make changes to medical advance directive?  No - Patient declined    No - Patient declined No - Patient declined  Copy of HAronain Chart?  No - copy requested       Would patient like information on creating a medical advance directive?    No - Patient declined       Current Medications (verified) Outpatient Encounter Medications as of 08/24/2021  Medication Sig   acetaminophen (TYLENOL) 500 MG tablet Take 1,000 mg by mouth every 6 (six) hours as needed for moderate pain.   ALPRAZolam (XANAX) 0.25 MG tablet 1 to 2 tablets at bedtime as needed   ammonium lactate (LAC-HYDRIN) 12 % lotion Apply 1 application topically daily.   betamethasone valerate ointment (VALISONE) 0.1 % Apply 1 application topically 2 (two) times daily. Use for up to 2 weeks as needed (Patient taking differently: Apply 1 application topically daily as needed (itching/ urinary pain).)   cromolyn (GASTROCROM) 100 MG/5ML solution Take 100 mg by mouth 4 (four) times daily as needed (alphagal symptoms).   Cyanocobalamin (VITAMIN B-12 PO)  Take 1 Dose by mouth daily.   diphenhydrAMINE (BENADRYL) 12.5 MG/5ML liquid Take 37.5 mg by mouth 4 (four) times daily as needed for allergies.   EPINEPHrine 0.3 mg/0.3 mL IJ SOAJ injection Use as directed for severe allergic reaction   estradiol (ESTRACE) 0.1 MG/GM vaginal cream 1 gram vaginally twice weekly (Patient taking differently: Place 1 Applicatorful vaginally 3 (three) times a week.)   fluconazole (DIFLUCAN) 150 MG tablet Take 1 tablet (150 mg total) by mouth daily. Take one tablet today.  May repeat in 3 days.   hydrocortisone (ANUSOL-HC) 2.5 % rectal cream Place 1 application rectally daily as needed for hemorrhoids.   losartan-hydrochlorothiazide (HYZAAR) 50-12.5 MG tablet TAKE 1 TABLET BY MOUTH EVERY DAY   omeprazole (PRILOSEC) 20 MG capsule Take 20 mg by mouth every morning.   sodium chloride (OCEAN) 0.65 % SOLN nasal spray Place 1 spray into both nostrils as needed (dryness).   traZODone (DESYREL) 50 MG tablet Take 0.5-1 tablets (25-50 mg total) by mouth at bedtime as needed for sleep. (Patient not taking: Reported on 08/24/2021)   VITAMIN A PO Take 1 capsule by mouth daily.   VITAMIN D PO Take 1 capsule by mouth daily.   No facility-administered encounter medications on file as of 08/24/2021.    Allergies (verified) Iodinated contrast media, Alpha-gal,  Gluten meal, Milk-related compounds, Shellfish allergy, Sulfa antibiotics, Azithromycin, and Wheat bran   History: Past Medical History:  Diagnosis Date   Acid reflux    Allergy to alpha-gal    Anxiety    Bilateral cataracts    Cancer (HCC)    Basal Cell Carcinoma, Melanoma   CIN I (cervical intraepithelial neoplasia I)    Colon polyps    benign   Hemorrhoid    History of basal cell carcinoma (BCC) excision    History of gallstones    History of melanoma excision RIGHT LEG  --  2012   History of Wolff-Parkinson-White (WPW) syndrome    1997  s/p  ablation   Hypertension    Lyme disease    Mild carotid artery disease  ([place])    per duplex 12-10-2012  bilateral ICA 1-39% stenosis   Sleep apnea    SVT (supraventricular tachycardia) ([place]) 10/31/2014   Past Surgical History:  Procedure Laterality Date   BREAST SURGERY Left 1999   BREAST MASS EXCISED, benign   CARDIAC ELECTROPHYSIOLOGY STUDY AND ABLATION  1997   SUCCESSFUL ABLATION OF WOLFE-PARKINSON-WHITE SYNDROME  (NO ISSUES SINCE)   CATARACT EXTRACTION, BILATERAL     CERVICAL BIOPSY  W/ LOOP ELECTRODE EXCISION  2005   dr gottsegen   COLONOSCOPY     COLPOSCOPY     HYSTEROSCOPY WITH D & C  02/13/2012   Procedure: DILATATION AND CURETTAGE /HYSTEROSCOPY;  Surgeon: Bennetta Laos, MD;  Location: [facility];  Service: Gynecology;  Laterality: N/A;  deficit - 710   LAPAROSCOPIC CHOLECYSTECTOMY  05-16-2005  dr Brantley Stage [facility]   melanoma excised     SVT-CARDIAC ABLATION     TRANSTHORACIC ECHOCARDIOGRAM  12-10-2012   dr Wynonia Lawman   mild LVH, ef 55-60%/  mild MR/  mild LAE and RAE   TUBAL LIGATION     UPPER GI ENDOSCOPY     VULVECTOMY N/A 07/25/2017   Procedure: WIDE EXCISION VULVECTOMY;  Surgeon: Marti Sleigh, MD;  Location: [facility];  Service: Gynecology;  Laterality: N/A;   Family History  Problem Relation Age of Onset   Cancer Mother        ESOPHAGEAL CANCER   Diabetes Father    Hyperlipidemia Father    Heart disease Father    Heart attack Father    Heart disease Brother    Breast cancer Paternal Aunt        Age 75's   Hyperlipidemia Paternal Grandmother    Heart disease Paternal Grandmother    Stroke Paternal Grandmother    Diabetes Paternal Grandfather    Hyperlipidemia Paternal Grandfather    Heart disease Paternal Grandfather    Stroke Paternal Grandfather    Cancer Sister        skin   Stroke Maternal Grandmother    Stroke Maternal Grandfather    Diabetes Brother    Social History   Socioeconomic History   Marital status: Divorced    Spouse name: Not on file   Number of children: 2    Years of education: Not on file   Highest education level: Not on file  Occupational History   Not on file  Tobacco Use   Smoking status: Never   Smokeless tobacco: Never  Vaping Use   Vaping Use: Never used  Substance and Sexual Activity   Alcohol use: Not Currently   Drug use: No   Sexual activity: Not Currently    Birth control/protection: Surgical    Comment: 1st  intercourse 76 yo--Fewer than 5 partners  Other Topics Concern   Not on file  Social History Narrative   Not on file   Social Determinants of Health   Financial Resource Strain: Low Risk    Difficulty of Paying Living Expenses: Not hard at all  Food Insecurity: No Food Insecurity   Worried About Charity fundraiser in the Last Year: Never true   Arboriculturist in the Last Year: Never true  Transportation Needs: No Transportation Needs   Lack of Transportation (Medical): No   Lack of Transportation (Non-Medical): No  Physical Activity: Not on file  Stress: No Stress Concern Present   Feeling of Stress : Not at all  Social Connections: Unknown   Frequency of Communication with Friends and Family: More than three times a week   Frequency of Social Gatherings with Friends and Family: More than three times a week   Attends Religious Services: Not on Electrical engineer or Organizations: Not on file   Attends Archivist Meetings: Not on file   Marital Status: Not on file    Tobacco Counseling Counseling given: Not Answered   Clinical Intake:  Pre-visit preparation completed: Yes        Diabetes: No  How often do you need to have someone help you when you read instructions, pamphlets, or other written materials from your doctor or pharmacy?: 1 - Never   Interpreter Needed?: No      Activities of Daily Living    08/24/2021    9:21 AM 05/22/2021    3:23 PM  In your present state of health, do you have any difficulty performing the following activities:  Hearing? 0 1   Vision? 0 0  Difficulty concentrating or making decisions? 0 0  Walking or climbing stairs? 0 0  Dressing or bathing? 0 0  Doing errands, shopping? 0   Preparing Food and eating ? N   Using the Toilet? N   In the past six months, have you accidently leaked urine? N   Do you have problems with loss of bowel control? N   Managing your Medications? N   Managing your Finances? N   Housekeeping or managing your Housekeeping? N     Patient Care Team: Crecencio Mc, MD as PCP - General (Internal Medicine) Michael Boston, MD as Consulting Physician (General Surgery) Arta Silence, MD as Consulting Physician (Gastroenterology) Marti Sleigh, MD as Attending Physician (Gynecologic Oncology) Teodoro Spray, MD as Consulting Physician (Cardiology) Hollice Espy, MD as Consulting Physician (Urology)  Indicate any recent Medical Services you may have received from other than [facility] providers in the past year (date may be approximate).     Assessment:   This is a routine wellness examination for Deborahann.  Virtual Visit via Telephone Note  I connected with  Harriette Tovey on 08/24/21 at  9:00 AM EDT by telephone and verified that I am speaking with the correct person using two identifiers.  Persons participating in the virtual visit: patient/Nurse Health Advisor   I discussed the limitations of performing an evaluation and management service by telehealth. We continued and completed visit with audio only. Some vital signs may be absent or patient reported.   Hearing/Vision screen Hearing Screening - Comments:: Patient is able to hear conversational tones without difficulty.  No issues reported. Vision Screening - Comments:: Followed by Dr. Gloriann Loan Cataract extraction, bilateral They have seen their ophthalmologist in the last 12 months.  Dietary issues and exercise activities discussed: Current Exercise Habits: Home exercise routine, Type of exercise: walking, Intensity:  Mild No red meat or wheat Monitors diet Good water intake   Goals Addressed               This Visit's Progress     Patient Stated     Increase physical activity (pt-stated)        Use treadmill/stationary bike Weight loss goal 40lb       Depression Screen    08/24/2021    9:12 AM 06/22/2021    3:18 PM 05/11/2021    2:17 PM 09/19/2020    3:33 PM 06/19/2020    3:19 PM  PHQ 2/9 Scores  PHQ - 2 Score 0 0 0 4 2  PHQ- 9 Score    11 8    Fall Risk    08/24/2021    9:19 AM 06/22/2021    3:17 PM 05/11/2021    2:17 PM 09/19/2020    2:46 PM 06/19/2020    3:12 PM  Fall Risk   Falls in the past year? 0 0 0 0 0  Number falls in past yr: 0      Risk for fall due to :  No Fall Risks No Fall Risks    Follow up Falls evaluation completed Falls evaluation completed Falls evaluation completed Falls evaluation completed Falls evaluation completed    [place]: Home free of loose throw rugs in walkways, pet beds, electrical cords, etc? Yes  Adequate lighting in your home to reduce risk of falls? Yes   ASSISTIVE DEVICES UTILIZED TO PREVENT FALLS: Life alert? No  Use of a cane, walker or w/c? No   TIMED UP AND GO: Was the test performed? No .   Cognitive Function:  Patient is alert and oriented x3.       Immunizations Immunization History  Administered Date(s) Administered   Fluad Quad(high Dose 65+) 12/22/2020   Influenza,inj,Quad PF,6+ Mos 03/03/2012   PFIZER(Purple Top)SARS-COV-2 Vaccination 05/07/2019, 05/28/2019, 01/18/2020   Pneumococcal Polysaccharide-23 04/23/2012   Tdap 08/23/2015   Shingrix Completed?: No.    Education has been provided regarding the importance of this vaccine. Patient has been advised to call insurance company to determine out of pocket expense if they have not yet received this vaccine. Advised may also receive vaccine at local pharmacy or Health Dept. Verbalized acceptance and understanding.  Screening  Tests Health Maintenance  Topic Date Due   COVID-19 Vaccine (4 - Booster for Pfizer series) 09/09/2021 (Originally 03/14/2020)   Zoster Vaccines- Shingrix (1 of 2) 11/24/2021 (Originally 02/04/1965)   Pneumonia Vaccine 67+ Years old (2 - PCV) 05/11/2022 (Originally 04/23/2013)   Hepatitis C Screening  06/23/2022 (Originally 02/04/1964)   INFLUENZA VACCINE  10/30/2021   COLONOSCOPY (Pts 45-75yr Insurance coverage will need to be confirmed)  02/05/2022   MAMMOGRAM  07/25/2023   TETANUS/TDAP  08/22/2025   DEXA SCAN  Completed   HPV VACCINES  Aged Out   Health Maintenance There are no preventive care reminders to display for this patient.  Lung Cancer Screening: (Low Dose CT Chest recommended if Age 75-80years, 30 pack-year currently smoking OR have quit w/in 15years.) does not qualify.   Vision Screening: Recommended annual ophthalmology exams for early detection of glaucoma and other disorders of the eye.  Dental Screening: Recommended annual dental exams for proper oral hygiene. Visits every 6 months. Followed by Dr. HNorman Herrlich   Community Resource Referral /  Chronic Care Management: CRR required this visit?  No   CCM required this visit?  No      Plan:   Keep all routine maintenance appointments.   I have personally reviewed and noted the following in the patient's chart:   Medical and social history Use of alcohol, tobacco or illicit drugs  Current medications and supplements including opioid prescriptions. Patient is not currently taking opioid prescriptions. Functional ability and status Nutritional status Physical activity Advanced directives List of other physicians Hospitalizations, surgeries, and ER visits in previous 12 months Vitals Screenings to include cognitive, depression, and falls Referrals and appointments  In addition, I have reviewed and discussed with patient certain preventive protocols, quality metrics, and best practice recommendations. A written  personalized care plan for preventive services as well as general preventive health recommendations were provided to patient.     Varney Biles, LPN   [phone]

## 2021-08-24 NOTE — Patient Instructions (Addendum)
  Anna [name] , Thank you for taking time to come for your Medicare Wellness Visit. I appreciate your ongoing commitment to your health goals. Please review the following plan we discussed and let me know if I can assist you in the future.   These are the goals we discussed:  Goals       Patient Stated     Increase physical activity (pt-stated)      Use treadmill/stationary bike Weight loss goal 40lb        This is a list of the screening recommended for you and due dates:  Health Maintenance  Topic Date Due   COVID-19 Vaccine (4 - Booster for Pfizer series) 09/09/2021*   Zoster (Shingles) Vaccine (1 of 2) 11/24/2021*   Pneumonia Vaccine (2 - PCV) 05/11/2022*   Hepatitis C Screening: USPSTF Recommendation to screen - Ages 18-79 yo.  06/23/2022*   Flu Shot  10/30/2021   Colon Cancer Screening  02/05/2022   Mammogram  07/25/2023   Tetanus Vaccine  08/22/2025   DEXA scan (bone density measurement)  Completed   HPV Vaccine  Aged Out  *Topic was postponed. The date shown is not the original due date.

## 2021-08-30 ENCOUNTER — Encounter: Payer: Self-pay | Admitting: Emergency Medicine

## 2021-08-30 ENCOUNTER — Ambulatory Visit
Admission: EM | Admit: 2021-08-30 | Discharge: 2021-08-30 | Disposition: A | Discharge status: Home / Self Care | Payer: Medicare Other | Attending: Family Medicine | Admitting: Family Medicine

## 2021-08-30 DIAGNOSIS — N898 Other specified noninflammatory disorders of vagina: Secondary | ICD-10-CM | POA: Diagnosis present

## 2021-08-30 DIAGNOSIS — R351 Nocturia: Secondary | ICD-10-CM | POA: Diagnosis present

## 2021-08-30 LAB — POCT URINALYSIS DIP (MANUAL ENTRY)
Bilirubin, UA: NEGATIVE
Blood, UA: NEGATIVE
Glucose, UA: NEGATIVE mg/dL
Ketones, POC UA: NEGATIVE mg/dL
Nitrite, UA: NEGATIVE
Protein Ur, POC: NEGATIVE mg/dL
Spec Grav, UA: 1.015 (ref 1.010–1.025)
Urobilinogen, UA: 0.2 E.U./dL
pH, UA: 5.5 (ref 5.0–8.0)

## 2021-08-30 NOTE — Telephone Encounter (Signed)
Spoke with patient. Reviewed surgery dates. Patient request to proceed with surgery on 09/17/21.  Advised patient I will forward to business office for return call. I will return call once surgery date and time confirmed. Patient verbalizes understanding and is agreeable.   Surgery request sent.

## 2021-08-30 NOTE — ED Provider Notes (Signed)
Anna [name]    CSN: [id number] Arrival date & time: 08/30/21  1156      History   Chief Complaint Chief Complaint  Patient presents with   Urinary Frequency   Headache    HPI Anna [name] is a 76 y.o. female.   HPI Patient presents today for urine recheck.  Patient was seen here at urgent care earlier this month diagnosed with a urinary tract infection.  She completed entire course of medication and reports over the last week has been frequent episodes of urinary frequency which is most prominent at nighttime.  She also reports having a mild headache and mild back pain which is the way her previous urinary tract infection presented. She is having dysuria although concern for yeast causing vaginal irritation. She completed one dose of Diflucan. She had no fever, nausea or vomiting.  Denies any hematuria.  Past Medical History:  Diagnosis Date   Acid reflux    Allergy to alpha-gal    Anxiety    Bilateral cataracts    Cancer (HCC)    Basal Cell Carcinoma, Melanoma   CIN I (cervical intraepithelial neoplasia I)    Colon polyps    benign   Hemorrhoid    History of basal cell carcinoma (BCC) excision    History of gallstones    History of melanoma excision RIGHT LEG  --  2012   History of Wolff-Parkinson-White (WPW) syndrome    1997  s/p  ablation   Hypertension    Lyme disease    Mild carotid artery disease ([place])    per duplex 12-10-2012  bilateral ICA 1-39% stenosis   Sleep apnea    SVT (supraventricular tachycardia) ([place]) 10/31/2014    Patient Active Problem List   Diagnosis Date Noted   Post-menopausal bleeding 05/13/2021   Gastroesophageal reflux disease 03/13/2021   Hematochezia 03/13/2021   Personal history of colonic polyps 03/13/2021   OSA (obstructive sleep apnea) 11/09/2020   Edema 09/20/2020   Essential hypertension 09/20/2020   B12 deficiency 06/24/2020   Vitamin D deficiency 06/24/2020   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    Allergy to alpha-gal 06/20/2020   Intestinal malabsorption 06/20/2020   Irritable bowel syndrome with constipation 01/04/2020   Prolapsed internal hemorrhoids, grade 4 01/04/2020   Internal and external bleeding hemorrhoids 01/04/2020   Melanoma ([place]) 03/22/2019   Heart palpitations 03/22/2019   Hyperlipidemia 04/27/2014   Chronic osteoarthritis 04/27/2014    Past Surgical History:  Procedure Laterality Date   BREAST SURGERY Left 1999   BREAST MASS EXCISED, benign   CARDIAC ELECTROPHYSIOLOGY STUDY AND ABLATION  1997   SUCCESSFUL ABLATION OF WOLFE-PARKINSON-WHITE SYNDROME  (NO ISSUES SINCE)   CATARACT EXTRACTION, BILATERAL     CERVICAL BIOPSY  W/ LOOP ELECTRODE EXCISION  2005   dr gottsegen   COLONOSCOPY     COLPOSCOPY     HYSTEROSCOPY WITH D & C  02/13/2012   Procedure: DILATATION AND CURETTAGE /HYSTEROSCOPY;  Surgeon: Bennetta Laos, MD;  Location: [place];  Service: Gynecology;  Laterality: N/A;  deficit - 710   LAPAROSCOPIC CHOLECYSTECTOMY  05-16-2005  dr Brantley Stage [facility]   melanoma excised     SVT-CARDIAC ABLATION     TRANSTHORACIC ECHOCARDIOGRAM  12-10-2012   dr Wynonia Lawman   mild LVH, ef 55-60%/  mild MR/  mild LAE and RAE   TUBAL LIGATION     UPPER GI ENDOSCOPY     VULVECTOMY N/A 07/25/2017   Procedure: WIDE EXCISION  VULVECTOMY;  Surgeon: Marti Sleigh, MD;  Location: [facility];  Service: Gynecology;  Laterality: N/A;    OB History     Gravida  2   Para  2   Term  2   Preterm      AB      Living  2      SAB      IAB      Ectopic      Multiple      Live Births               Home Medications    Prior to Admission medications   Medication Sig Start Date End Date Taking? Authorizing Provider  acetaminophen (TYLENOL) 500 MG tablet Take 1,000 mg by mouth every 6 (six) hours as needed for moderate pain.    [provider]  ALPRAZolam Duanne Moron) 0.25 MG tablet 1 to 2 tablets at bedtime as needed 06/22/21    Crecencio Mc, MD  ammonium lactate (LAC-HYDRIN) 12 % lotion Apply 1 application topically daily. 05/14/21   [provider]  betamethasone valerate ointment (VALISONE) 0.1 % Apply 1 application topically 2 (two) times daily. Use for up to 2 weeks as needed Patient taking differently: Apply 1 application topically daily as needed (itching/ urinary pain). 04/06/21   Salvadore Dom, MD  cromolyn (GASTROCROM) 100 MG/5ML solution Take 100 mg by mouth 4 (four) times daily as needed (alphagal symptoms).    [provider]  Cyanocobalamin (VITAMIN B-12 PO) Take 1 Dose by mouth daily.    [provider]  diphenhydrAMINE (BENADRYL) 12.5 MG/5ML liquid Take 37.5 mg by mouth 4 (four) times daily as needed for allergies.    [provider]  EPINEPHrine 0.3 mg/0.3 mL IJ SOAJ injection Use as directed for severe allergic reaction 06/10/18   Kennith Gain, MD  estradiol (ESTRACE) 0.1 MG/GM vaginal cream 1 gram vaginally twice weekly Patient taking differently: Place 1 Applicatorful vaginally 3 (three) times a week. 04/06/21   Salvadore Dom, MD  fluconazole (DIFLUCAN) 150 MG tablet Take 1 tablet (150 mg total) by mouth daily. Take one tablet today.  May repeat in 3 days. 08/04/21   Sharion Balloon, NP  hydrocortisone (ANUSOL-HC) 2.5 % rectal cream Place 1 application rectally daily as needed for hemorrhoids. 07/15/20   [provider]  losartan-hydrochlorothiazide (HYZAAR) 50-12.5 MG tablet TAKE 1 TABLET BY MOUTH EVERY DAY 02/13/21   Crecencio Mc, MD  omeprazole (PRILOSEC) 20 MG capsule Take 20 mg by mouth every morning. 04/26/21   [provider]  sodium chloride (OCEAN) 0.65 % SOLN nasal spray Place 1 spray into both nostrils as needed (dryness).    [provider]  traZODone (DESYREL) 50 MG tablet Take 0.5-1 tablets (25-50 mg total) by mouth at bedtime as needed for sleep. Patient not taking: Reported on 08/24/2021 06/22/21   Crecencio Mc, MD  VITAMIN A PO Take 1 capsule by mouth daily.    [provider]  VITAMIN D PO Take 1 capsule by mouth daily.    [provider]    Family History Family History  Problem Relation Age of Onset   Cancer Mother        ESOPHAGEAL CANCER   Diabetes Father    Hyperlipidemia Father    Heart disease Father    Heart attack Father    Heart disease Brother    Breast cancer Paternal Aunt  Age 20's   Hyperlipidemia Paternal Grandmother    Heart disease Paternal Grandmother    Stroke Paternal Grandmother    Diabetes Paternal Grandfather    Hyperlipidemia Paternal Grandfather    Heart disease Paternal Grandfather    Stroke Paternal Grandfather    Cancer Sister        skin   Stroke Maternal Grandmother    Stroke Maternal Grandfather    Diabetes Brother     Social History Social History   Tobacco Use   Smoking status: Never   Smokeless tobacco: Never  Vaping Use   Vaping Use: Never used  Substance Use Topics   Alcohol use: Not Currently   Drug use: No     Allergies   Iodinated contrast media, Alpha-gal, Gluten meal, Milk-related compounds, Shellfish allergy, Sulfa antibiotics, Azithromycin, and Wheat bran   Review of Systems Review of Systems Pertinent negatives listed in HPI   Physical Exam Triage Vital Signs ED Triage Vitals  Enc Vitals Group     BP 08/30/21 1249 (!) 148/82     Pulse Rate 08/30/21 1248 78     Resp 08/30/21 1248 18     Temp 08/30/21 1248 98.1 F (36.7 C)     Temp Source 08/30/21 1248 Oral     SpO2 08/30/21 1248 97 %     Weight --      Height --      Head Circumference --      Peak Flow --      Pain Score --      Pain Loc --      Pain Edu? --      Excl. in [place]? --    No data found.  Updated Vital Signs BP (!) 148/82 (BP Location: Left Arm)   Pulse 78   Temp 98.1 F (36.7 C) (Oral)   Resp 18   SpO2 97%   Visual Acuity Right Eye Distance:   Left Eye Distance:   Bilateral Distance:    Right Eye  Near:   Left Eye Near:    Bilateral Near:     Physical Exam .  UC Treatments / Results  Labs (all labs ordered are listed, but only abnormal results are displayed) Labs Reviewed  POCT URINALYSIS DIP (MANUAL ENTRY) - Abnormal; Notable for the following components:      Result Value   Leukocytes, UA Trace (*)    All other components within normal limits  URINE CULTURE  CERVICOVAGINAL ANCILLARY ONLY    EKG   Radiology No results found.  Procedures Procedures (including critical care time)  Medications Ordered in UC Medications - No data to display  Initial Impression / Assessment and Plan / UC Course  I have reviewed the triage vital signs and the nursing notes.  Pertinent labs & imaging results that were available during my care of the patient were reviewed by me and considered in my medical decision making (see chart for details).    UA significant for only a trace of leuks. Given close proximity of time that she completed an antibiotic and UA results, will wait for urine culture and cytology to result before prescribing any treatment. Suspect nocturia is possibly related to diuretic therapy or bladder. Will defer nocturia to PCP for further work-up if urine culture is negative. Return as needed Final Clinical Impressions(s) / UC Diagnoses   Final diagnoses:  Nocturia  Vaginal irritation   Discharge Instructions   None    ED Prescriptions   None  PDMP not reviewed this encounter.   Scot Jun, FNP 08/30/21 [phone]

## 2021-08-30 NOTE — ED Triage Notes (Signed)
Pt presents with urinary frequency and HA x 1 week. Pt also c/o vaginal itching.

## 2021-08-31 LAB — CERVICOVAGINAL ANCILLARY ONLY
Bacterial Vaginitis (gardnerella): NEGATIVE
Candida Glabrata: NEGATIVE
Candida Vaginitis: NEGATIVE
Comment: NEGATIVE
Comment: NEGATIVE
Comment: NEGATIVE

## 2021-08-31 NOTE — Telephone Encounter (Signed)
Left message to call Sie Formisano, RN at [facility], [phone].  

## 2021-08-31 NOTE — Telephone Encounter (Signed)
Spoke with patient. Surgery date request confirmed.  Advised surgery is scheduled for 09/17/21, [facility] at 12pm. Surgery instruction sheet and hospital brochure reviewed, printed copy will be mailed.  Patient verbalizes understanding and is agreeable.  Routing to [employer]. Encounter may be closed once benefits reviewed.

## 2021-09-01 LAB — URINE CULTURE
Culture: 60000 — AB
Special Requests: NORMAL

## 2021-09-03 ENCOUNTER — Telehealth: Payer: Self-pay | Admitting: Emergency Medicine

## 2021-09-03 ENCOUNTER — Telehealth (HOSPITAL_COMMUNITY): Payer: Self-pay

## 2021-09-03 MED ORDER — CEPHALEXIN 500 MG PO CAPS: 500.0000 mg | ORAL_CAPSULE | Freq: Two times a day (BID) | ORAL | 0 refills | Status: DC

## 2021-09-03 NOTE — Telephone Encounter (Signed)
Discussed urine culture with patient, prescribed Keflex twice daily for 7 days based on culture results, extended course from 5 to 7 days due to recurrence within the last month, recommended patient reach out to her urologist for reevaluation and further management, verbalized understanding

## 2021-09-06 NOTE — Telephone Encounter (Signed)
Spoke with patient regarding surgery benefits. Patient acknowledges understanding of information presented. Patient is aware that benefits presented are professional benefits only. Patient is aware the hospital will call with facility benefits. See account note.    

## 2021-09-08 ENCOUNTER — Other Ambulatory Visit: Payer: Self-pay | Admitting: Internal Medicine

## 2021-09-10 ENCOUNTER — Encounter: Payer: Self-pay | Admitting: Obstetrics and Gynecology

## 2021-09-10 ENCOUNTER — Ambulatory Visit (INDEPENDENT_AMBULATORY_CARE_PROVIDER_SITE_OTHER): Payer: Medicare Other | Admitting: Obstetrics and Gynecology

## 2021-09-10 VITALS — BP 132/74 | HR 77 | Ht 68.0 in | Wt 209.0 lb

## 2021-09-10 DIAGNOSIS — R9389 Abnormal findings on diagnostic imaging of other specified body structures: Secondary | ICD-10-CM | POA: Diagnosis not present

## 2021-09-10 DIAGNOSIS — I10 Essential (primary) hypertension: Secondary | ICD-10-CM | POA: Diagnosis not present

## 2021-09-10 DIAGNOSIS — N950 Postmenopausal bleeding: Secondary | ICD-10-CM | POA: Diagnosis not present

## 2021-09-10 NOTE — Progress Notes (Signed)
GYNECOLOGY  VISIT   HPI: 76 y.o.   Divorced White or Caucasian Not Hispanic or Latino  female   [phone] with No LMP recorded. Patient is postmenopausal.   here for a preoperative visit.  H/O PMP bleeding. U/S with a thickened stripe, endometrial biopsy with inactive endometrium.     Recently treated for a UTI. No current urinary frequency and urgency.  She c/o abdominal bloating, has had long term issues with intermittent lower abdominal and lower back pain. She has IBD, diarrhea and constipation. Recently saw her GI MD. On medication for GERD. Last colonoscopy was in the last 5 years with [facility]. Thinks it was in 2019, benign polyps were removed.   GYNECOLOGIC HISTORY: No LMP recorded. Patient is postmenopausal. Contraception:PMP Menopausal hormone therapy: no, just using vaginal estrogen        OB History     Gravida  2   Para  2   Term  2   Preterm      AB      Living  2      SAB      IAB      Ectopic      Multiple      Live Births                 Patient Active Problem List   Diagnosis Date Noted   Post-menopausal bleeding 05/13/2021   Gastroesophageal reflux disease 03/13/2021   Hematochezia 03/13/2021   Personal history of colonic polyps 03/13/2021   OSA (obstructive sleep apnea) 11/09/2020   Edema 09/20/2020   Essential hypertension 09/20/2020   B12 deficiency 06/24/2020   Vitamin D deficiency 06/24/2020   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   Allergy to alpha-gal 06/20/2020   Intestinal malabsorption 06/20/2020   Irritable bowel syndrome with constipation 01/04/2020   Prolapsed internal hemorrhoids, grade 4 01/04/2020   Internal and external bleeding hemorrhoids 01/04/2020   Melanoma ([place]) 03/22/2019   Heart palpitations 03/22/2019   Hyperlipidemia 04/27/2014   Chronic osteoarthritis 04/27/2014    Past Medical History:  Diagnosis Date   Acid reflux    Allergy to alpha-gal    Anxiety    Bilateral cataracts    Cancer (HCC)     Basal Cell Carcinoma, Melanoma   CIN I (cervical intraepithelial neoplasia I)    Colon polyps    benign   Hemorrhoid    History of basal cell carcinoma (BCC) excision    History of gallstones    History of melanoma excision RIGHT LEG  --  2012   History of Wolff-Parkinson-White (WPW) syndrome    1997  s/p  ablation   Hypertension    Lyme disease    Mild carotid artery disease ([place])    per duplex 12-10-2012  bilateral ICA 1-39% stenosis   Sleep apnea    SVT (supraventricular tachycardia) ([place]) 10/31/2014    Past Surgical History:  Procedure Laterality Date   BREAST SURGERY Left 1999   BREAST MASS EXCISED, benign   CARDIAC ELECTROPHYSIOLOGY STUDY AND ABLATION  1997   SUCCESSFUL ABLATION OF WOLFE-PARKINSON-WHITE SYNDROME  (NO ISSUES SINCE)   CATARACT EXTRACTION, BILATERAL     CERVICAL BIOPSY  W/ LOOP ELECTRODE EXCISION  2005   dr gottsegen   COLONOSCOPY     COLPOSCOPY     HYSTEROSCOPY WITH D & C  02/13/2012   Procedure: DILATATION AND CURETTAGE /HYSTEROSCOPY;  Surgeon: Bennetta Laos, MD;  Location: [facility];  Service: Gynecology;  Laterality:  N/A;  deficit - 710   LAPAROSCOPIC CHOLECYSTECTOMY  05-16-2005  dr Brantley [facility]   melanoma excised     SVT-CARDIAC ABLATION     TRANSTHORACIC ECHOCARDIOGRAM  12-10-2012   dr Wynonia Lawman   mild LVH, ef 55-60%/  mild MR/  mild LAE and RAE   TUBAL LIGATION     UPPER GI ENDOSCOPY     VULVECTOMY N/A 07/25/2017   Procedure: WIDE EXCISION VULVECTOMY;  Surgeon: Marti Sleigh, MD;  Location: [facility];  Service: Gynecology;  Laterality: N/A;    Current Outpatient Medications  Medication Sig Dispense Refill   acetaminophen (TYLENOL) 500 MG tablet Take 1,000 mg by mouth every 6 (six) hours as needed for moderate pain.     ALPRAZolam (XANAX) 0.25 MG tablet 1 to 2 tablets at bedtime as needed 60 tablet 5   ammonium lactate (LAC-HYDRIN) 12 % lotion Apply 1 application topically daily.     betamethasone  valerate ointment (VALISONE) 0.1 % Apply 1 application topically 2 (two) times daily. Use for up to 2 weeks as needed (Patient taking differently: Apply 1 application  topically daily as needed (itching/ urinary pain).) 30 g 0   cromolyn (GASTROCROM) 100 MG/5ML solution Take 100 mg by mouth 4 (four) times daily as needed (alphagal symptoms).     Cyanocobalamin (VITAMIN B-12 PO) Take 1 Dose by mouth daily.     diphenhydrAMINE (BENADRYL) 12.5 MG/5ML liquid Take 37.5 mg by mouth 4 (four) times daily as needed for allergies.     EPINEPHrine 0.3 mg/0.3 mL IJ SOAJ injection Use as directed for severe allergic reaction 2 Device 2   estradiol (ESTRACE) 0.1 MG/GM vaginal cream 1 gram vaginally twice weekly (Patient taking differently: Place 1 Applicatorful vaginally 3 (three) times a week.) 42.5 g 1   hydrocortisone (ANUSOL-HC) 2.5 % rectal cream Place 1 application rectally daily as needed for hemorrhoids.     losartan-hydrochlorothiazide (HYZAAR) 50-12.5 MG tablet TAKE 1 TABLET BY MOUTH EVERY DAY 90 tablet 1   omeprazole (PRILOSEC) 20 MG capsule Take 20 mg by mouth every morning.     sodium chloride (OCEAN) 0.65 % SOLN nasal spray Place 1 spray into both nostrils as needed (dryness).     VITAMIN A PO Take 1 capsule by mouth daily.     VITAMIN D PO Take 1 capsule by mouth daily.     No current facility-administered medications for this visit.     ALLERGIES: Iodinated contrast media, Alpha-gal, Gluten meal, Milk-related compounds, Shellfish allergy, Sulfa antibiotics, Azithromycin, and Wheat bran  Family History  Problem Relation Age of Onset   Cancer Mother        ESOPHAGEAL CANCER   Diabetes Father    Hyperlipidemia Father    Heart disease Father    Heart attack Father    Heart disease Brother    Breast cancer Paternal 33        Age 90's   Hyperlipidemia Paternal Grandmother    Heart disease Paternal Grandmother    Stroke Paternal Grandmother    Diabetes Paternal Grandfather     Hyperlipidemia Paternal Grandfather    Heart disease Paternal Grandfather    Stroke Paternal Grandfather    Cancer Sister        skin   Stroke Maternal Grandmother    Stroke Maternal Grandfather    Diabetes Brother     Social History   Socioeconomic History   Marital status: Divorced    Spouse name: Not on file   Number of  children: 2   Years of education: Not on file   Highest education level: Not on file  Occupational History   Not on file  Tobacco Use   Smoking status: Never   Smokeless tobacco: Never  Vaping Use   Vaping Use: Never used  Substance and Sexual Activity   Alcohol use: Not Currently   Drug use: No   Sexual activity: Not Currently    Birth control/protection: Surgical    Comment: 1st intercourse 76 yo--Fewer than 5 partners  Other Topics Concern   Not on file  Social History Narrative   Not on file   Social Determinants of Health   Financial Resource Strain: Low Risk  (08/24/2021)   Overall Financial Resource Strain (CARDIA)    Difficulty of Paying Living Expenses: Not hard at all  Food Insecurity: No Food Insecurity (08/24/2021)   Hunger Vital Sign    Worried About Running Out of Food in the Last Year: Never true    [place] in the Last Year: Never true  Transportation Needs: No Transportation Needs (08/24/2021)   PRAPARE - Hydrologist (Medical): No    Lack of Transportation (Non-Medical): No  Physical Activity: Not on file  Stress: No Stress Concern Present (08/24/2021)   [place]    Feeling of Stress : Not at all  Social Connections: Unknown (08/24/2021)   Social Connection and Isolation Panel [NHANES]    Frequency of Communication with Friends and Family: More than three times a week    Frequency of Social Gatherings with Friends and Family: More than three times a week    Attends Religious Services: Not on file    Active Member of [place] or  Organizations: Not on file    Attends Archivist Meetings: Not on file    Marital Status: Not on file  Intimate Partner Violence: Not At Risk (08/24/2021)   Humiliation, Afraid, Rape, and Kick questionnaire    Fear of Current or Ex-Partner: No    Emotionally Abused: No    Physically Abused: No    Sexually Abused: No    ROS  PHYSICAL EXAMINATION:    BP 132/74   Pulse 77   Ht '5\' 8"'$  (1.727 m)   Wt 209 lb (94.8 kg)   SpO2 100%   BMI 31.78 kg/m     General appearance: alert, cooperative and appears stated age Neck: no adenopathy, supple, symmetrical, trachea midline and thyroid normal to inspection and palpation Heart: regular rate and rhythm Lungs: CTAB Abdomen: soft, non-tender; bowel sounds normal; no masses,  no organomegaly Extremities: normal, atraumatic, no cyanosis Skin: normal color, texture and turgor, no rashes or lesions Lymph: normal cervical supraclavicular and inguinal nodes Neurologic: grossly normal   1. Postmenopausal bleeding Plan: hysteroscopy, dilation and curettage. Reviewed risks, including: bleeding, infection, uterine perforation, fluid overload, need for further sugery  2. Thickened endometrium  3. Essential hypertension Controlled.

## 2021-09-10 NOTE — H&P (View-Only) (Signed)
GYNECOLOGY  VISIT   HPI: 76 y.o.   Divorced White or Caucasian Not Hispanic or Latino  female   [phone] with No LMP recorded. Patient is postmenopausal.   here for a preoperative visit.  H/O PMP bleeding. U/S with a thickened stripe, endometrial biopsy with inactive endometrium.     Recently treated for a UTI. No current urinary frequency and urgency.  She c/o abdominal bloating, has had long term issues with intermittent lower abdominal and lower back pain. She has IBD, diarrhea and constipation. Recently saw her GI MD. On medication for GERD. Last colonoscopy was in the last 5 years with [facility]. Thinks it was in 2019, benign polyps were removed.   GYNECOLOGIC HISTORY: No LMP recorded. Patient is postmenopausal. Contraception:PMP Menopausal hormone therapy: no, just using vaginal estrogen        OB History     Gravida  2   Para  2   Term  2   Preterm      AB      Living  2      SAB      IAB      Ectopic      Multiple      Live Births                 Patient Active Problem List   Diagnosis Date Noted   Post-menopausal bleeding 05/13/2021   Gastroesophageal reflux disease 03/13/2021   Hematochezia 03/13/2021   Personal history of colonic polyps 03/13/2021   OSA (obstructive sleep apnea) 11/09/2020   Edema 09/20/2020   Essential hypertension 09/20/2020   B12 deficiency 06/24/2020   Vitamin D deficiency 06/24/2020   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   Allergy to alpha-gal 06/20/2020   Intestinal malabsorption 06/20/2020   Irritable bowel syndrome with constipation 01/04/2020   Prolapsed internal hemorrhoids, grade 4 01/04/2020   Internal and external bleeding hemorrhoids 01/04/2020   Melanoma ([place]) 03/22/2019   Heart palpitations 03/22/2019   Hyperlipidemia 04/27/2014   Chronic osteoarthritis 04/27/2014    Past Medical History:  Diagnosis Date   Acid reflux    Allergy to alpha-gal    Anxiety    Bilateral cataracts    Cancer (HCC)     Basal Cell Carcinoma, Melanoma   CIN I (cervical intraepithelial neoplasia I)    Colon polyps    benign   Hemorrhoid    History of basal cell carcinoma (BCC) excision    History of gallstones    History of melanoma excision RIGHT LEG  --  2012   History of Wolff-Parkinson-White (WPW) syndrome    1997  s/p  ablation   Hypertension    Lyme disease    Mild carotid artery disease ([place])    per duplex 12-10-2012  bilateral ICA 1-39% stenosis   Sleep apnea    SVT (supraventricular tachycardia) ([place]) 10/31/2014    Past Surgical History:  Procedure Laterality Date   BREAST SURGERY Left 1999   BREAST MASS EXCISED, benign   CARDIAC ELECTROPHYSIOLOGY STUDY AND ABLATION  1997   SUCCESSFUL ABLATION OF WOLFE-PARKINSON-WHITE SYNDROME  (NO ISSUES SINCE)   CATARACT EXTRACTION, BILATERAL     CERVICAL BIOPSY  W/ LOOP ELECTRODE EXCISION  2005   dr gottsegen   COLONOSCOPY     COLPOSCOPY     HYSTEROSCOPY WITH D & C  02/13/2012   Procedure: DILATATION AND CURETTAGE /HYSTEROSCOPY;  Surgeon: Bennetta Laos, MD;  Location: [facility];  Service: Gynecology;  Laterality:  N/A;  deficit - 710   LAPAROSCOPIC CHOLECYSTECTOMY  05-16-2005  dr [facility]ley [facility]   melanoma excised     SVT-CARDIAC ABLATION     TRANSTHORACIC ECHOCARDIOGRAM  12-10-2012   dr Wynonia Lawman   mild LVH, ef 55-60%/  mild MR/  mild LAE and RAE   TUBAL LIGATION     UPPER GI ENDOSCOPY     VULVECTOMY N/A 07/25/2017   Procedure: WIDE EXCISION VULVECTOMY;  Surgeon: Marti Sleigh, MD;  Location[facility];  Service: Gynecology;  Laterality: N/A;    Current Outpatient Medications  Medication Sig Dispense Refill   acetaminophen (TYLENOL) 500 MG tablet Take 1,000 mg by mouth every 6 (six) hours as needed for moderate pain.     ALPRAZolam (XANAX) 0.25 MG tablet 1 to 2 tablets at bedtime as needed 60 tablet 5   ammonium lactate (LAC-HYDRIN) 12 % lotion Apply 1 application topically daily.     betamethasone  valerate ointment (VALISONE) 0.1 % Apply 1 application topically 2 (two) times daily. Use for up to 2 weeks as needed (Patient taking differently: Apply 1 application  topically daily as needed (itching/ urinary pain).) 30 g 0   cromolyn (GASTROCROM) 100 MG/5ML solution Take 100 mg by mouth 4 (four) times daily as needed (alphagal symptoms).     Cyanocobalamin (VITAMIN B-12 PO) Take 1 Dose by mouth daily.     diphenhydrAMINE (BENADRYL) 12.5 MG/5ML liquid Take 37.5 mg by mouth 4 (four) times daily as needed for allergies.     EPINEPHrine 0.3 mg/0.3 mL IJ SOAJ injection Use as directed for severe allergic reaction 2 Device 2   estradiol (ESTRACE) 0.1 MG/GM vaginal cream 1 gram vaginally twice weekly (Patient taking differently: Place 1 Applicatorful vaginally 3 (three) times a week.) 42.5 g 1   hydrocortisone (ANUSOL-HC) 2.5 % rectal cream Place 1 application rectally daily as needed for hemorrhoids.     losartan-hydrochlorothiazide (HYZAAR) 50-12.5 MG tablet TAKE 1 TABLET BY MOUTH EVERY DAY 90 tablet 1   omeprazole (PRILOSEC) 20 MG capsule Take 20 mg by mouth every morning.     sodium chloride (OCEAN) 0.65 % SOLN nasal spray Place 1 spray into both nostrils as needed (dryness).     VITAMIN A PO Take 1 capsule by mouth daily.     VITAMIN D PO Take 1 capsule by mouth daily.     No current facility-administered medications for this visit.     ALLERGIES: Iodinated contrast media, Alpha-gal, Gluten meal, Milk-related compounds, Shellfish allergy, Sulfa antibiotics, Azithromycin, and Wheat bran  Family History  Problem Relation Age of Onset   Cancer Mother        ESOPHAGEAL CANCER   Diabetes Father    Hyperlipidemia Father    Heart disease Father    Heart attack Father    Heart disease Brother    Breast cancer Paternal 87        Age 15's   Hyperlipidemia Paternal Grandmother    Heart disease Paternal Grandmother    Stroke Paternal Grandmother    Diabetes Paternal Grandfather     Hyperlipidemia Paternal Grandfather    Heart disease Paternal Grandfather    Stroke Paternal Grandfather    Cancer Sister        skin   Stroke Maternal Grandmother    Stroke Maternal Grandfather    Diabetes Brother     Social History   Socioeconomic History   Marital status: Divorced    Spouse name: Not on file   Number of  children: 2   Years of education: Not on file   Highest education level: Not on file  Occupational History   Not on file  Tobacco Use   Smoking status: Never   Smokeless tobacco: Never  Vaping Use   Vaping Use: Never used  Substance and Sexual Activity   Alcohol use: Not Currently   Drug use: No   Sexual activity: Not Currently    Birth control/protection: Surgical    Comment: 1st intercourse 76 yo--Fewer than 5 partners  Other Topics Concern   Not on file  Social History Narrative   Not on file   Social Determinants of Health   Financial Resource Strain: Low Risk  (08/24/2021)   Overall Financial Resource Strain (CARDIA)    Difficulty of Paying Living Expenses: Not hard at all  Food Insecurity: No Food Insecurity (08/24/2021)   Hunger Vital Sign    Worried About Running Out of Food in the Last Year: Never true    [place] in the Last Year: Never true  Transportation Needs: No Transportation Needs (08/24/2021)   PRAPARE - Hydrologist (Medical): No    Lack of Transportation (Non-Medical): No  Physical Activity: Not on file  Stress: No Stress Concern Present (08/24/2021)   [place]    Feeling of Stress : Not at all  Social Connections: Unknown (08/24/2021)   Social Connection and Isolation Panel [NHANES]    Frequency of Communication with Friends and Family: More than three times a week    Frequency of Social Gatherings with Friends and Family: More than three times a week    Attends Religious Services: Not on file    Active Member of [place] or  Organizations: Not on file    Attends Archivist Meetings: Not on file    Marital Status: Not on file  Intimate Partner Violence: Not At Risk (08/24/2021)   Humiliation, Afraid, Rape, and Kick questionnaire    Fear of Current or Ex-Partner: No    Emotionally Abused: No    Physically Abused: No    Sexually Abused: No    ROS  PHYSICAL EXAMINATION:    BP 132/74   Pulse 77   Ht '5\' 8"'$  (1.727 m)   Wt 209 lb (94.8 kg)   SpO2 100%   BMI 31.78 kg/m     General appearance: alert, cooperative and appears stated age Neck: no adenopathy, supple, symmetrical, trachea midline and thyroid normal to inspection and palpation Heart: regular rate and rhythm Lungs: CTAB Abdomen: soft, non-tender; bowel sounds normal; no masses,  no organomegaly Extremities: normal, atraumatic, no cyanosis Skin: normal color, texture and turgor, no rashes or lesions Lymph: normal cervical supraclavicular and inguinal nodes Neurologic: grossly normal   1. Postmenopausal bleeding Plan: hysteroscopy, dilation and curettage. Reviewed risks, including: bleeding, infection, uterine perforation, fluid overload, need for further sugery  2. Thickened endometrium  3. Essential hypertension Controlled.

## 2021-09-11 ENCOUNTER — Encounter (HOSPITAL_BASED_OUTPATIENT_CLINIC_OR_DEPARTMENT_OTHER): Payer: Self-pay | Admitting: Obstetrics and Gynecology

## 2021-09-11 ENCOUNTER — Other Ambulatory Visit: Payer: Self-pay

## 2021-09-11 NOTE — Progress Notes (Signed)
Spoke w/ via phone for pre-op interview---pt Lab needs dos----  [facility]             Lab results------see below COVID test -----patient states asymptomatic no test needed Arrive at ------- NPO after MN NO Solid Food.  Clear liquids from MN until--- Med rec completed Medications to take morning of surgery -----none Diabetic medication -----n/a Patient instructed no nail polish to be worn day of surgery Patient instructed to bring photo id and insurance card day of surgery Patient aware to have Driver (ride ) / caregiver  suzanne hunt daughter   for 24 hours after surgery  Patient Special Instructions -----none Pre-Op special Istructions -----none Patient verbalized understanding of instructions that were given at this phone interview. Patient denies shortness of breath, chest pain, fever, cough at this phone interview.   [facility] Dr Bartholome Bill 06-21-2021 epic/f/u prn chart/.epic NM myocardial perfusion LVEF 69 % chart/care everywhere Echo LVEF >55%  05-28-2021  chart/care everywhere Ekg 05-22-2021 chart/epic

## 2021-09-17 ENCOUNTER — Ambulatory Visit (HOSPITAL_BASED_OUTPATIENT_CLINIC_OR_DEPARTMENT_OTHER): Payer: Medicare Other | Admitting: Anesthesiology

## 2021-09-17 ENCOUNTER — Ambulatory Visit (HOSPITAL_BASED_OUTPATIENT_CLINIC_OR_DEPARTMENT_OTHER)
Admission: RE | Admit: 2021-09-17 | Discharge: 2021-09-17 | Disposition: A | Discharge status: Home / Self Care | Payer: Medicare Other | Source: Ambulatory Visit | Attending: Obstetrics and Gynecology | Admitting: Obstetrics and Gynecology

## 2021-09-17 ENCOUNTER — Encounter (HOSPITAL_BASED_OUTPATIENT_CLINIC_OR_DEPARTMENT_OTHER): Payer: Self-pay | Admitting: Obstetrics and Gynecology

## 2021-09-17 ENCOUNTER — Encounter (HOSPITAL_BASED_OUTPATIENT_CLINIC_OR_DEPARTMENT_OTHER)
Admission: RE | Disposition: A | Discharge status: Home / Self Care | Payer: Self-pay | Source: Ambulatory Visit | Attending: Obstetrics and Gynecology

## 2021-09-17 DIAGNOSIS — I10 Essential (primary) hypertension: Secondary | ICD-10-CM | POA: Insufficient documentation

## 2021-09-17 DIAGNOSIS — G4730 Sleep apnea, unspecified: Secondary | ICD-10-CM | POA: Insufficient documentation

## 2021-09-17 DIAGNOSIS — I739 Peripheral vascular disease, unspecified: Secondary | ICD-10-CM | POA: Diagnosis not present

## 2021-09-17 DIAGNOSIS — E669 Obesity, unspecified: Secondary | ICD-10-CM | POA: Diagnosis not present

## 2021-09-17 DIAGNOSIS — F419 Anxiety disorder, unspecified: Secondary | ICD-10-CM | POA: Diagnosis not present

## 2021-09-17 DIAGNOSIS — R9389 Abnormal findings on diagnostic imaging of other specified body structures: Secondary | ICD-10-CM | POA: Insufficient documentation

## 2021-09-17 DIAGNOSIS — N840 Polyp of corpus uteri: Secondary | ICD-10-CM

## 2021-09-17 DIAGNOSIS — K219 Gastro-esophageal reflux disease without esophagitis: Secondary | ICD-10-CM | POA: Insufficient documentation

## 2021-09-17 DIAGNOSIS — N950 Postmenopausal bleeding: Secondary | ICD-10-CM | POA: Insufficient documentation

## 2021-09-17 DIAGNOSIS — Z6831 Body mass index (BMI) 31.0-31.9, adult: Secondary | ICD-10-CM | POA: Diagnosis not present

## 2021-09-17 DIAGNOSIS — Z01818 Encounter for other preprocedural examination: Secondary | ICD-10-CM

## 2021-09-17 HISTORY — PX: DILATATION & CURETTAGE/HYSTEROSCOPY WITH MYOSURE: SHX6511

## 2021-09-17 HISTORY — DX: Urinary tract infection, site not specified: N39.0

## 2021-09-17 HISTORY — DX: Unspecified osteoarthritis, unspecified site: M19.90

## 2021-09-17 HISTORY — DX: Personal history of malignant neoplasm, unspecified: Z98.890

## 2021-09-17 HISTORY — DX: Other specified postprocedural states: Z85.9

## 2021-09-17 HISTORY — DX: Irritable bowel syndrome, unspecified: K58.9

## 2021-09-17 LAB — POCT I-STAT, CHEM 8
BUN: 13 mg/dL (ref 8–23)
Calcium, Ion: 1.23 mmol/L (ref 1.15–1.40)
Chloride: 107 mmol/L (ref 98–111)
Creatinine, Ser: 0.7 mg/dL (ref 0.44–1.00)
Glucose, Bld: 115 mg/dL — ABNORMAL HIGH (ref 70–99)
HCT: 41 % (ref 36.0–46.0)
Hemoglobin: 13.9 g/dL (ref 12.0–15.0)
Potassium: 3.9 mmol/L (ref 3.5–5.1)
Sodium: 144 mmol/L (ref 135–145)
TCO2: 24 mmol/L (ref 22–32)

## 2021-09-17 SURGERY — DILATATION & CURETTAGE/HYSTEROSCOPY WITH MYOSURE
Anesthesia: General

## 2021-09-17 MED ORDER — LIDOCAINE HCL (PF) 2 % IJ SOLN
INTRAMUSCULAR | Status: AC
  Filled 2021-09-17: qty 5

## 2021-09-17 MED ORDER — PROPOFOL 10 MG/ML IV BOLUS
INTRAVENOUS | Status: DC | PRN
  Administered 2021-09-17: 150 mg via INTRAVENOUS

## 2021-09-17 MED ORDER — ACETAMINOPHEN 500 MG PO TABS
ORAL_TABLET | ORAL | Status: AC
  Filled 2021-09-17: qty 2

## 2021-09-17 MED ORDER — DIPHENHYDRAMINE HCL 50 MG/ML IJ SOLN
INTRAMUSCULAR | Status: DC | PRN
  Administered 2021-09-17: 12.5 mg via INTRAVENOUS

## 2021-09-17 MED ORDER — LIDOCAINE 2% (20 MG/ML) 5 ML SYRINGE
INTRAMUSCULAR | Status: DC | PRN
  Administered 2021-09-17: 20 mg via INTRAVENOUS

## 2021-09-17 MED ORDER — SODIUM CHLORIDE 0.9 % IR SOLN
Status: DC | PRN
  Administered 2021-09-17: 3000 mL

## 2021-09-17 MED ORDER — ONDANSETRON HCL 4 MG/2ML IJ SOLN
INTRAMUSCULAR | Status: DC | PRN
  Administered 2021-09-17: 4 mg via INTRAVENOUS

## 2021-09-17 MED ORDER — PROPOFOL 10 MG/ML IV BOLUS
INTRAVENOUS | Status: AC
  Filled 2021-09-17: qty 20

## 2021-09-17 MED ORDER — KETOROLAC TROMETHAMINE 30 MG/ML IJ SOLN
INTRAMUSCULAR | Status: AC
  Filled 2021-09-17: qty 1

## 2021-09-17 MED ORDER — MEPERIDINE HCL 25 MG/ML IJ SOLN: 6.2500 mg | INTRAMUSCULAR | Status: DC | PRN

## 2021-09-17 MED ORDER — DEXAMETHASONE SODIUM PHOSPHATE 10 MG/ML IJ SOLN
INTRAMUSCULAR | Status: AC
  Filled 2021-09-17: qty 1

## 2021-09-17 MED ORDER — ACETAMINOPHEN 500 MG PO TABS
1000.0000 mg | ORAL_TABLET | ORAL | Status: AC
  Administered 2021-09-17: 1000 mg via ORAL

## 2021-09-17 MED ORDER — DIPHENHYDRAMINE HCL 50 MG/ML IJ SOLN
INTRAMUSCULAR | Status: AC
  Filled 2021-09-17: qty 1

## 2021-09-17 MED ORDER — OXYCODONE HCL 5 MG/5ML PO SOLN: 5.0000 mg | Freq: Once | ORAL | Status: AC | PRN

## 2021-09-17 MED ORDER — LACTATED RINGERS IV SOLN: INTRAVENOUS | Status: DC

## 2021-09-17 MED ORDER — FENTANYL CITRATE (PF) 100 MCG/2ML IJ SOLN
INTRAMUSCULAR | Status: DC | PRN
Start: 2021-09-17 — End: 2021-09-17
  Administered 2021-09-17: 25 ug via INTRAVENOUS
  Administered 2021-09-17: 50 ug via INTRAVENOUS

## 2021-09-17 MED ORDER — ONDANSETRON HCL 4 MG/2ML IJ SOLN
INTRAMUSCULAR | Status: AC
  Filled 2021-09-17: qty 2

## 2021-09-17 MED ORDER — OXYCODONE HCL 5 MG PO TABS
5.0000 mg | ORAL_TABLET | Freq: Once | ORAL | Status: AC | PRN
  Administered 2021-09-17: 5 mg via ORAL

## 2021-09-17 MED ORDER — FENTANYL CITRATE (PF) 100 MCG/2ML IJ SOLN: 25.0000 ug | INTRAMUSCULAR | Status: DC | PRN

## 2021-09-17 MED ORDER — KETOROLAC TROMETHAMINE 30 MG/ML IJ SOLN
INTRAMUSCULAR | Status: DC | PRN
  Administered 2021-09-17: 15 mg via INTRAVENOUS

## 2021-09-17 MED ORDER — FENTANYL CITRATE (PF) 100 MCG/2ML IJ SOLN
INTRAMUSCULAR | Status: AC
  Filled 2021-09-17: qty 2

## 2021-09-17 MED ORDER — MIDAZOLAM HCL 2 MG/2ML IJ SOLN: 0.5000 mg | Freq: Once | INTRAMUSCULAR | Status: DC | PRN

## 2021-09-17 MED ORDER — OXYCODONE HCL 5 MG PO TABS
ORAL_TABLET | ORAL | Status: AC
  Filled 2021-09-17: qty 1

## 2021-09-17 MED ORDER — DEXAMETHASONE SODIUM PHOSPHATE 10 MG/ML IJ SOLN
INTRAMUSCULAR | Status: DC | PRN
  Administered 2021-09-17: 5 mg via INTRAVENOUS

## 2021-09-17 SURGICAL SUPPLY — 22 items
CATH ROBINSON RED A/P 16FR (CATHETERS) IMPLANT
DEVICE MYOSURE LITE (MISCELLANEOUS) IMPLANT
DEVICE MYOSURE REACH (MISCELLANEOUS) ×1 IMPLANT
DILATOR CANAL MILEX (MISCELLANEOUS) IMPLANT
DRSG TELFA 3X8 NADH (GAUZE/BANDAGES/DRESSINGS) ×2 IMPLANT
GAUZE 4X4 16PLY ~~LOC~~+RFID DBL (SPONGE) ×4 IMPLANT
GLOVE BIO SURGEON STRL SZ 6.5 (GLOVE) ×2 IMPLANT
GOWN STRL REUS W/TWL LRG LVL3 (GOWN DISPOSABLE) ×2 IMPLANT
IV NS IRRIG 3000ML ARTHROMATIC (IV SOLUTION) ×2 IMPLANT
KIT PROCEDURE FLUENT (KITS) ×2 IMPLANT
KIT TURNOVER CYSTO (KITS) ×2 IMPLANT
MYOSURE XL FIBROID (MISCELLANEOUS)
PACK VAGINAL MINOR WOMEN LF (CUSTOM PROCEDURE TRAY) ×2 IMPLANT
PAD DRESSING TELFA 3X8 NADH (GAUZE/BANDAGES/DRESSINGS) ×1 IMPLANT
PAD OB MATERNITY 4.3X12.25 (PERSONAL CARE ITEMS) ×2 IMPLANT
PAD PREP 24X48 CUFFED NSTRL (MISCELLANEOUS) ×2 IMPLANT
SEAL CERVICAL OMNI LOK (ABLATOR) IMPLANT
SEAL ROD LENS SCOPE MYOSURE (ABLATOR) ×2 IMPLANT
SYR 20ML LL LF (SYRINGE) IMPLANT
SYSTEM TISS REMOVAL MYOSURE XL (MISCELLANEOUS) IMPLANT
TOWEL OR 17X26 10 PK STRL BLUE (TOWEL DISPOSABLE) ×2 IMPLANT
WATER STERILE IRR 500ML POUR (IV SOLUTION) IMPLANT

## 2021-09-17 NOTE — Anesthesia Procedure Notes (Signed)
Procedure Name: LMA Insertion Date/Time: 09/17/2021 11:16 AM  Performed by: Mechele Claude, CRNAPre-anesthesia Checklist: Patient identified, Emergency Drugs available, Suction available and Patient being monitored Patient Re-evaluated:Patient Re-evaluated prior to induction Oxygen Delivery Method: Circle system utilized Preoxygenation: Pre-oxygenation with 100% oxygen Induction Type: IV induction Ventilation: Mask ventilation without difficulty LMA: LMA inserted LMA Size: 4.0 Number of attempts: 1 Airway Equipment and Method: Bite block Placement Confirmation: positive ETCO2 Tube secured with: Tape Dental Injury: Teeth and Oropharynx as per pre-operative assessment

## 2021-09-17 NOTE — Discharge Instructions (Addendum)
  Post Anesthesia Home Care Instructions  Activity: Get plenty of rest for the remainder of the day. A responsible individual must stay with you for 24 hours following the procedure.  For the next 24 hours, DO NOT: -Drive a car -Paediatric nurse -Drink alcoholic beverages -Take any medication unless instructed by your physician -Make any legal decisions or sign important papers.  Meals: Start with liquid foods such as gelatin or soup. Progress to regular foods as tolerated. Avoid greasy, spicy, heavy foods. If nausea and/or vomiting occur, drink only clear liquids until the nausea and/or vomiting subsides. Call your physician if vomiting continues.  Special Instructions/Symptoms: Your throat may feel dry or sore from the anesthesia or the breathing tube placed in your throat during surgery. If this causes discomfort, gargle with warm salt water. The discomfort should disappear within 24 hours.    DISCHARGE INSTRUCTIONS: HYSTEROSCOPY  The following instructions have been prepared to help you care for yourself upon your return home.  May take Ibuprofen after 6 PM as needed for cramping/discomfort.  May take stool softner while taking narcotic pain medication to prevent constipation.  Drink plenty of water.  Personal hygiene:  Use sanitary pads for vaginal drainage, not tampons.  Shower the day after your procedure.  NO tub baths, pools or Jacuzzis for 2-3 weeks.  Wipe front to back after using the bathroom.  Activity and limitations:  Do NOT drive or operate any equipment for 24 hours. The effects of anesthesia are still present and drowsiness may result.  Do NOT rest in bed all day.  Walking is encouraged.  Walk up and down stairs slowly.  You may resume your normal activity in one to two days or as indicated by your physician. Sexual activity: NO intercourse for at least 2 weeks after the procedure, or as indicated by your Doctor.  Diet: Eat a light meal as desired this  evening. You may resume your usual diet tomorrow.  Return to Work: You may resume your work activities in one to two days or as indicated by Marine scientist.  What to expect after your surgery: Expect to have vaginal bleeding/discharge for 2-3 days and spotting for up to 10 days. It is not unusual to have soreness for up to 1-2 weeks. You may have a slight burning sensation when you urinate for the first day. Mild cramps may continue for a couple of days. You may have a regular period in 2-6 weeks.  Call your doctor for any of the following:  Excessive vaginal bleeding or clotting, saturating and changing one pad every hour.  Inability to urinate 6 hours after discharge from hospital.  Pain not relieved by pain medication.  Fever of 100.4 F or greater.  Unusual vaginal discharge or odor.

## 2021-09-17 NOTE — Interval H&P Note (Signed)
History and Physical Interval Note:  09/17/2021 10:46 AM  Anna [name]  has presented today for surgery, with the diagnosis of postmenopausal bleeding.  The various methods of treatment have been discussed with the patient and family. After consideration of risks, benefits and other options for treatment, the patient has consented to  Procedure(s): [place] (N/A) as a surgical intervention.  The patient's history has been reviewed, patient examined, no change in status, stable for surgery.  I have reviewed the patient's chart and labs.  Questions were answered to the patient's satisfaction.     Salvadore Dom

## 2021-09-17 NOTE — Anesthesia Preprocedure Evaluation (Addendum)
Anesthesia Evaluation  Patient identified by MRN, date of birth, ID band Patient awake    Reviewed: Allergy & Precautions, NPO status , Patient's Chart, lab work & pertinent test results  History of Anesthesia Complications Negative for: history of anesthetic complications  Airway Mallampati: II  TM Distance: >3 FB Neck ROM: Full    Dental  (+) Dental Advisory Given   Pulmonary sleep apnea (mild, does not use CPAP) ,    breath sounds clear to auscultation       Cardiovascular hypertension, Pt. on medications (-) angina+ Peripheral Vascular Disease  + dysrhythmias (WPW: s/p ablation)  Rhythm:Regular Rate:Normal  '14 ECHO: mild LVH. Systolic function was normal. EF 55-60%. Wall motion was normal; no regional wall motion abnormalities.  Mitral valve: Mildly to moderately calcified annulus. Mild MR.     Neuro/Psych Anxiety negative neurological ROS     GI/Hepatic Neg liver ROS, GERD  Medicated and Controlled,  Endo/Other  obese  Renal/GU negative Renal ROS     Musculoskeletal  (+) Arthritis ,   Abdominal (+) + obese,   Peds  Hematology negative hematology ROS (+)   Anesthesia Other Findings   Reproductive/Obstetrics                            Anesthesia Physical Anesthesia Plan  ASA: 3  Anesthesia Plan: General   Post-op Pain Management: Tylenol PO (pre-op)*   Induction: Intravenous  PONV Risk Score and Plan: 3 and Ondansetron, Dexamethasone and Treatment may vary due to age or medical condition  Airway Management Planned: LMA  Additional Equipment: None  Intra-op Plan:   Post-operative Plan:   Informed Consent: I have reviewed the patients History and Physical, chart, labs and discussed the procedure including the risks, benefits and alternatives for the proposed anesthesia with the patient or authorized representative who has indicated his/her understanding and acceptance.      Dental advisory given  Plan Discussed with: CRNA and Surgeon  Anesthesia Plan Comments:        Anesthesia Quick Evaluation

## 2021-09-17 NOTE — Op Note (Signed)
Preoperative Diagnosis: postmenopausal bleeding, thickened endometrium  Postoperative Diagnosis: same  Procedure: Hysteroscopy, dilation and curettage  Surgeon: Dr Sumner Boast  Assistants: None  Anesthesia: General via LMA  EBL: 5 cc  Fluids: 400 cc LR  Fluid deficit: 130 cc  Urine output: not recorded  Indications for surgery: The patient is a 76 yo female, who presented with postmenopausal bleeding. Work up included a normal pap smear, an ultrasound with a thickened endometrial stripe and an endometrial biopsy with inactive endometrium.  The risks of the surgery were reviewed with the patient and the consent form was signed prior to her surgery.  Findings: Hysteroscopy: thickening and possible scar tissue in the right upper uterus, no obvious polyp. Post curettage and hysteroscopy the uterine cavity was empty.   Specimens: endometrial curetting's   Procedure: The patient was taken to the operating room with an IV in place. She was placed in the dorsal lithotomy position and anesthesia was administered. She was prepped and draped in the usual sterile fashion for a vaginal procedure. She voided on the way to the OR. A weighted speculum was placed in the vagina and a single tooth tenaculum was placed on the anterior lip of the cervix. The cervix was dilated to a #6 hagar dilator. The uterus was sounded to 5-6 cm. The myosure hysteroscope was inserted into the uterine cavity. With continuous infusion of normal saline, the uterine cavity was visualized with the above findings. The hysteroscope was removed and the cavity was then curetted with the small sharp curette. The cavity had the characteristically gritty texture at the end of the curettage. A moderate amount of tissue was removed. The curette was removed and the hysteroscope was reinserted. The prior area of thickening was no longer seen. The myosure reach was used to finish the curettage under direct visualization. The myosure and  the single tooth tenaculum were removed. The speculum was removed. The patients perineum was cleansed and she was taken out of the dorsal lithotomy position.  Upon awakening the LMA was removed and the patient was transferred to the recovery room in stable and awake condition.  The sponge and instrument count were correct. There were no complications.

## 2021-09-17 NOTE — Anesthesia Postprocedure Evaluation (Signed)
Anesthesia Post Note  Patient: Anna [name]  Procedure(s) Performed: [place]     Patient location during evaluation: Phase II Anesthesia Type: General Level of consciousness: awake and alert, patient cooperative and oriented Pain management: pain level controlled Vital Signs Assessment: post-procedure vital signs reviewed and stable Respiratory status: spontaneous breathing, nonlabored ventilation and respiratory function stable Cardiovascular status: blood pressure returned to baseline and stable Postop Assessment: no apparent nausea or vomiting, able to ambulate and adequate PO intake Anesthetic complications: no Comments: Rash has resolved   No notable events documented.  Last Vitals:  Vitals:   09/17/21 1230 09/17/21 1245  BP: 137/84 130/66  Pulse: 67 63  Resp: 11 17  Temp:    SpO2: 95% 96%    Last Pain:  Vitals:   09/17/21 1315  TempSrc:   PainSc: 7                  Mathan Darroch,E. Lively Haberman

## 2021-09-17 NOTE — Transfer of Care (Addendum)
Immediate Anesthesia Transfer of Care Note  Patient: Anna [name]  Procedure(s) Performed: Procedure(s) (LRB): DILATATION & CURETTAGE/HYSTEROSCOPY WITH MYOSURE (N/A)  Patient Location: PACU  Anesthesia Type: General  Level of Consciousness: awake, alert  and oriented  Airway & Oxygen Therapy: Patient Spontanous Breathing and Patient connected to NASAL CANNULA oxygen  Post-op Assessment: Report given to PACU RN and Post -op Vital signs reviewed and stable  Post vital signs: Reviewed and stable  Complications: No apparent anesthesia complications  Last Vitals:  Vitals Value Taken Time  BP 153/71 09/17/21 1151  Temp    Pulse 79 09/17/21 1156  Resp 11 09/17/21 1156  SpO2 96 % 09/17/21 1156  Vitals shown include unvalidated device data.  Last Pain:  Vitals:   09/17/21 1023  TempSrc: Oral  PainSc: 0-No pain      Patients Stated Pain Goal: 5 (56/70/14 1030)  Complications: Improving redness and hives noted throughout body.  No tongue or lip swelling. No wheezing.  IV benadryl given in OR with some improvement noted in PACU. VSS.

## 2021-09-18 LAB — SURGICAL PATHOLOGY

## 2021-09-19 ENCOUNTER — Encounter (HOSPITAL_BASED_OUTPATIENT_CLINIC_OR_DEPARTMENT_OTHER): Payer: Self-pay | Admitting: Obstetrics and Gynecology

## 2021-09-24 ENCOUNTER — Ambulatory Visit (INDEPENDENT_AMBULATORY_CARE_PROVIDER_SITE_OTHER): Payer: Medicare Other | Admitting: Obstetrics and Gynecology

## 2021-09-24 ENCOUNTER — Encounter: Payer: Self-pay | Admitting: Obstetrics and Gynecology

## 2021-09-24 VITALS — BP 118/74

## 2021-09-24 DIAGNOSIS — Z9889 Other specified postprocedural states: Secondary | ICD-10-CM

## 2021-09-24 DIAGNOSIS — Z6831 Body mass index (BMI) 31.0-31.9, adult: Secondary | ICD-10-CM

## 2021-11-23 ENCOUNTER — Other Ambulatory Visit: Payer: Self-pay | Admitting: *Deleted

## 2021-11-23 NOTE — Telephone Encounter (Signed)
Patient called requesting refill on hydrocortisone 2.5% rectal cream, last filled in 07/18/20. Patient said Dr.Kendall last filled Rx for her. Please advise

## 2021-11-26 MED ORDER — HYDROCORTISONE (PERIANAL) 2.5 % EX CREA: 1.0000 | TOPICAL_CREAM | Freq: Four times a day (QID) | CUTANEOUS | 1 refills | Status: DC

## 2021-12-24 ENCOUNTER — Encounter: Payer: Self-pay | Admitting: Internal Medicine

## 2021-12-24 ENCOUNTER — Ambulatory Visit (INDEPENDENT_AMBULATORY_CARE_PROVIDER_SITE_OTHER): Payer: Medicare Other | Admitting: Internal Medicine

## 2021-12-24 VITALS — BP 138/78 | HR 98 | Temp 98.1°F | Ht 68.0 in | Wt 207.8 lb

## 2021-12-24 DIAGNOSIS — I10 Essential (primary) hypertension: Secondary | ICD-10-CM

## 2021-12-24 DIAGNOSIS — E7849 Other hyperlipidemia: Secondary | ICD-10-CM

## 2021-12-24 DIAGNOSIS — R5383 Other fatigue: Secondary | ICD-10-CM

## 2021-12-24 DIAGNOSIS — F409 Phobic anxiety disorder, unspecified: Secondary | ICD-10-CM

## 2021-12-24 DIAGNOSIS — R7301 Impaired fasting glucose: Secondary | ICD-10-CM

## 2021-12-24 DIAGNOSIS — K581 Irritable bowel syndrome with constipation: Secondary | ICD-10-CM

## 2021-12-24 DIAGNOSIS — R102 Pelvic and perineal pain: Secondary | ICD-10-CM

## 2021-12-24 DIAGNOSIS — Z8679 Personal history of other diseases of the circulatory system: Secondary | ICD-10-CM

## 2021-12-24 DIAGNOSIS — F5105 Insomnia due to other mental disorder: Secondary | ICD-10-CM

## 2021-12-24 DIAGNOSIS — K9089 Other intestinal malabsorption: Secondary | ICD-10-CM

## 2021-12-24 DIAGNOSIS — R944 Abnormal results of kidney function studies: Secondary | ICD-10-CM

## 2021-12-24 MED ORDER — ZOSTER VAC RECOMB ADJUVANTED 50 MCG/0.5ML IM SUSR: 0.5000 mL | Freq: Once | INTRAMUSCULAR | 0 refills | Status: AC

## 2021-12-24 MED ORDER — ALPRAZOLAM 0.5 MG PO TABS: 0.5000 mg | ORAL_TABLET | Freq: Every evening | ORAL | 2 refills | Status: DC | PRN

## 2021-12-24 NOTE — Assessment & Plan Note (Signed)
Secondary to  Alpha gal  Allergy;  Ige level was very elevated at 0.76  In March 2022  . Managed by [facility] ; oral  Gastrocrom and oral  ketotifen compounded have been prescribed.

## 2021-12-24 NOTE — Progress Notes (Unsigned)
Patient ID: Anna [name], female    DOB: 1945-10-22  Age: 76 y.o. MRN: [id number]  The patient is here for follow up and management of other chronic and acute problems.   The risk factors are reflected in the social history.  The roster of all physicians providing medical care to patient - is listed in the Snapshot section of the chart.  Activities of daily living:  The patient is 100% independent in all ADLs: dressing, toileting, feeding as well as independent mobility  Home safety : The patient has smoke detectors in the home. They wear seatbelts.  There are no firearms at home. There is no violence in the home.   There is no risks for hepatitis, STDs or HIV. There is no   history of blood transfusion. They have no travel history to infectious disease endemic areas of the world.  The patient has seen their dentist in the last six month. They have seen their eye doctor in the last year. They admit to slight hearing difficulty with regard to whispered voices and some television programs.  They have deferred audiologic testing in the last year.  They do not  have excessive sun exposure. Discussed the need for sun protection: hats, long sleeves and use of sunscreen if there is significant sun exposure.   Diet: the importance of a healthy diet is discussed. They do have a healthy diet.  The benefits of regular aerobic exercise were discussed. She walks 4 times per week ,  20 minutes.   Depression screen: there are no signs or vegative symptoms of depression- irritability, change in appetite, anhedonia, sadness/tearfullness.  Cognitive assessment: the patient manages all their financial and personal affairs and is actively engaged. They could relate day,date,year and events; recalled 2/3 objects at 3 minutes; performed clock-face test normally.  The following portions of the patient's history were reviewed and updated as appropriate: allergies, current medications, past family history, past  medical history,  past surgical history, past social history  and problem list.  Visual acuity was not assessed per patient preference since she has regular follow up with her ophthalmologist. Hearing and body mass index were assessed and reviewed.   During the course of the visit the patient was educated and counseled about appropriate screening and preventive services including : fall prevention , diabetes screening, nutrition counseling, colorectal cancer screening, and recommended immunizations.    CC: The primary encounter diagnosis was Essential hypertension. Diagnoses of Other hyperlipidemia, Fatigue, unspecified type, and Impaired fasting glucose were also pertinent to this visit.  1) GI issues,  chronic:  currently not constipated .  Having loose stools 2 per daily, none at night .  Has bought "digestive enzymes" this weekend. 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 Eating very little. Not exercising .  Receiving PT for right leg weakness.    4) Melanoma x 4th one removed last week by [facility] . Left leg   History Tiahna has a past medical history of Acid reflux, Allergy to alpha-gal, Anxiety, Bilateral cataracts, Cancer ([place]), CIN I (cervical intraepithelial neoplasia I), Colon polyps, H/O squamous cell carcinoma excision, Hemorrhoid, History of basal cell carcinoma (BCC) excision, History of melanoma excision (RIGHT LEG  --  2012), history of SVT (supraventricular tachycardia) ([place]) (10/31/2014), History of Wolff-Parkinson-White (WPW) syndrome, Hypertension, IBS (irritable bowel syndrome), Lyme disease (2005), Mild carotid artery disease ([place]), OsteoArthritis, Sleep apnea, and  UTI (urinary tract infection).   She has a past surgical history that includes Cervical biopsy w/ loop electrode excision (2005   dr Cherylann Banas); Tubal ligation; Colposcopy; melanoma excised; Cardiac electrophysiology study and  ablation (1996); Hysteroscopy with D & C (02/13/2012); SVT-CARDIAC ABLATION; Laparoscopic cholecystectomy (05-16-2005  dr cornett [facility]); transthoracic echocardiogram (12-10-2012   dr Wynonia Lawman); Breast surgery (Left, 1999); Colonoscopy; Upper gi endoscopy; Cataract extraction, bilateral; Vulvectomy (N/A, 07/25/2017); and Dilatation & curettage/hysteroscopy with myosure (N/A, 09/17/2021).   Her family history includes Breast cancer in her paternal aunt; Cancer in her mother and sister; Diabetes in her brother, father, and paternal grandfather; Heart attack in her father; Heart disease in her brother, father, paternal grandfather, and paternal grandmother; Hyperlipidemia in her father, paternal grandfather, and paternal grandmother; Stroke in her maternal grandfather, maternal grandmother, paternal grandfather, and paternal grandmother.She reports that she has never smoked. She has never used smokeless tobacco. She reports that she does not currently use alcohol. She reports that she does not use drugs.  Outpatient Medications Prior to Visit  Medication Sig Dispense Refill   acetaminophen (TYLENOL) 500 MG tablet Take 1,000 mg by mouth every 6 (six) hours as needed for moderate pain.     ALPRAZolam (XANAX) 0.25 MG tablet Take 0.25-0.5 mg by mouth at bedtime as needed.     ammonium lactate (LAC-HYDRIN) 12 % lotion Apply 1 application topically daily.     diphenhydrAMINE (BENADRYL) 12.5 MG/5ML liquid Take 37.5 mg by mouth 4 (four) times daily as needed for allergies.     EPINEPHrine 0.3 mg/0.3 mL IJ SOAJ injection Use as directed for severe allergic reaction 2 Device 2   estradiol (ESTRACE) 0.1 MG/GM vaginal cream 1 gram vaginally twice weekly (Patient taking differently: Place 1 Applicatorful vaginally 3 (three) times a week.) 42.5 g 1   hydrocortisone (ANUSOL-HC) 2.5 % rectal cream Place 1 Application rectally 4 (four) times daily. 30 g 1   losartan-hydrochlorothiazide (HYZAAR) 50-12.5 MG tablet TAKE 1 TABLET  BY MOUTH EVERY DAY (Patient taking differently: daily.) 90 tablet 1   omeprazole (PRILOSEC) 20 MG capsule Take 20 mg by mouth at bedtime.     sodium chloride (OCEAN) 0.65 % SOLN nasal spray Place 1 spray into both nostrils as needed (dryness).     VITAMIN A PO Take 1 capsule by mouth daily.     VITAMIN D PO Take 1 capsule by mouth daily.     ALPRAZolam (XANAX) 0.5 MG tablet Take 0.5 mg by mouth at bedtime. (Patient not taking: Reported on 12/24/2021)     betamethasone valerate ointment (VALISONE) 0.1 % Apply 1 application topically 2 (two) times daily. Use for up to 2 weeks as needed (Patient not taking: Reported on 12/24/2021) 30 g 0   cromolyn (GASTROCROM) 100 MG/5ML solution Take 100 mg by mouth 4 (four) times daily as needed (alphagal symptoms). (Patient not taking: Reported on 12/24/2021)     Cyanocobalamin (VITAMIN B-12 PO) Take 1 Dose by mouth daily. Liquid b 12 5000 mcg, 1 dropperful per day (Patient not taking: Reported on 12/24/2021)     No facility-administered medications prior to visit.    Review of Systems  Objective:  BP 138/78 (BP Location: Left Arm, Patient Position: Sitting, Cuff Size: Large)   Pulse 98   Temp 98.1 F (36.7 C) (Oral)   Ht '5\' 8"'$  (1.727 m)   Wt 207 lb 12.8 oz (94.3 kg)   SpO2 95%   BMI 31.60 kg/m   Physical Exam  Physical Exam  Assessment & Plan:   Problem List Items Addressed This Visit     Hyperlipidemia   Essential hypertension - Primary   Other Visit Diagnoses     Fatigue, unspecified type       Impaired fasting glucose           I have discontinued Carmelina Dane. Splitt's Cyanocobalamin (VITAMIN B-12 PO), betamethasone valerate ointment, and cromolyn. I am also having her maintain her EPINEPHrine, VITAMIN A PO, VITAMIN D PO, estradiol, omeprazole, ammonium lactate, acetaminophen, diphenhydrAMINE, sodium chloride, losartan-hydrochlorothiazide, hydrocortisone, and ALPRAZolam.  No orders of the defined types were placed in this  encounter.   Medications Discontinued During This Encounter  Medication Reason   betamethasone valerate ointment (VALISONE) 0.1 %    cromolyn (GASTROCROM) 100 MG/5ML solution    Cyanocobalamin (VITAMIN B-12 PO)    ALPRAZolam (XANAX) 0.5 MG tablet Change in therapy    Follow-up: No follow-ups on file.   Crecencio Mc, MD

## 2021-12-24 NOTE — Assessment & Plan Note (Signed)
Recommend an elimiination type diet to address her multiple food intolerances, eg Dr Idolina Primer ett's book "AIP Diet for Beginners: A Comprehensive Guide to the Autoimmune Paleo protocol"

## 2021-12-24 NOTE — Patient Instructions (Addendum)
Jinny Sanders  is a wonderful delicate white fish available in  frozen fillets at [employer]  Your diet is the key to your gut problems.  Eliminate dairy,  or Carry lactase with you   I will send you the name of the author who has written the most authoritative explanation of elimination diets and gut health    You might want to try using Relaxium for insomnia  (as seen on TV commercials) . It contains:  Melatonin 5 mg  Chamomile 25 mg Passionflower extract 75 mg GABA 100 mg Ashwaganda extract 125 mg Magnesium citrate, glycinate, oxide (100 mg)  L tryptophan 500 mg Valerest (proprietary  ingredient ; probably valeria root extract)

## 2021-12-24 NOTE — Progress Notes (Incomplete)
Patient ID: Larena Sox, female    DOB: 02/06/1946  Age: 76 y.o. MRN: [id number]  The patient is here for follow up and management of other chronic and acute problems.   The risk factors are reflected in the social history.  The roster of all physicians providing medical care to patient - is listed in the Snapshot section of the chart.  Activities of daily living:  The patient is 100% independent in all ADLs: dressing, toileting, feeding as well as independent mobility  Home safety : The patient has smoke detectors in the home. They wear seatbelts.  There are no firearms at home. There is no violence in the home.   There is no risks for hepatitis, STDs or HIV. There is no   history of blood transfusion. They have no travel history to infectious disease endemic areas of the world.  The patient has seen their dentist in the last six month. They have seen their eye doctor in the last year. They admit to slight hearing difficulty with regard to whispered voices and some television programs.  They have deferred audiologic testing in the last year.  They do not  have excessive sun exposure. Discussed the need for sun protection: hats, long sleeves and use of sunscreen if there is significant sun exposure.   Diet: the importance of a healthy diet is discussed.  She has multiple food intolerances but is often nonadherent due to work schedule , resulting in diarrhea .  The benefits of regular aerobic exercise were discussed. She walks occasionallys.   Depression screen: there are no signs or vegative symptoms of depression- irritability, change in appetite, anhedonia, sadness/tearfullness.  Cognitive assessment: the patient manages all their financial and personal affairs and is actively engaged. They could relate day,date,year and events; recalled 2/3 objects at 3 minutes; performed clock-face test normally.  The following portions of the patient's history were reviewed and updated as  appropriate: allergies, current medications, past family history, past medical history,  past surgical history, past social history  and problem list.  Visual acuity was not assessed per patient preference since she has regular follow up with her ophthalmologist. Hearing and body mass index were assessed and reviewed.   During the course of the visit the patient was educated and counseled about appropriate screening and preventive services including : fall prevention , diabetes screening, nutrition counseling, colorectal cancer screening, and recommended immunizations.    CC: The primary encounter diagnosis was Essential hypertension. Diagnoses of Other hyperlipidemia, Fatigue, unspecified type, and Impaired fasting glucose were also pertinent to this visit.  1) GI issues,  chronic:  currently not constipated .  Having loose stools 2 per daily, none at night .  Has bought "digestive enzymes" this weekend. 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 Eating very little. Not exercising .  Receiving PT for right leg weakness.    4) Melanoma x 4th one removed last week by [place] . Left leg   History [place] has a past medical history of Acid reflux, Allergy to alpha-gal, Anxiety, Bilateral cataracts, Cancer ([place]), CIN I (cervical intraepithelial neoplasia I), Colon polyps, H/O squamous cell carcinoma excision, Hemorrhoid, History of basal cell carcinoma (BCC) excision, History of melanoma excision (RIGHT LEG  --  2012), history of SVT (supraventricular tachycardia) ([place]) (10/31/2014), History of Wolff-Parkinson-White (WPW) syndrome, Hypertension, IBS (irritable bowel syndrome), Lyme disease (2005), Mild carotid artery  disease ([place]), OsteoArthritis, Sleep apnea, and UTI (urinary tract infection).   She has a past surgical history that includes Cervical biopsy w/ loop electrode excision (2005   dr Cherylann Banas); Tubal  ligation; Colposcopy; melanoma excised; Cardiac electrophysiology study and ablation (1996); Hysteroscopy with D & C (02/13/2012); SVT-CARDIAC ABLATION; Laparoscopic cholecystectomy (05-16-2005  dr [facility]); transthoracic echocardiogram (12-10-2012   dr Wynonia Lawman); Breast surgery (Left, 1999); Colonoscopy; Upper gi endoscopy; Cataract extraction, bilateral; Vulvectomy (N/A, 07/25/2017); and Dilatation & curettage/hysteroscopy with myosure (N/A, 09/17/2021).   Her family history includes Breast cancer in her paternal aunt; Cancer in her mother and sister; Diabetes in her brother, father, and paternal grandfather; Heart attack in her father; Heart disease in her brother, father, paternal grandfather, and paternal grandmother; Hyperlipidemia in her father, paternal grandfather, and paternal grandmother; Stroke in her maternal grandfather, maternal grandmother, paternal grandfather, and paternal grandmother.She reports that she has never smoked. She has never used smokeless tobacco. She reports that she does not currently use alcohol. She reports that she does not use drugs.  Outpatient Medications Prior to Visit  Medication Sig Dispense Refill  . acetaminophen (TYLENOL) 500 MG tablet Take 1,000 mg by mouth every 6 (six) hours as needed for moderate pain.    Marland Kitchen ALPRAZolam (XANAX) 0.25 MG tablet Take 0.25-0.5 mg by mouth at bedtime as needed.    Marland Kitchen ammonium lactate (LAC-HYDRIN) 12 % lotion Apply 1 application topically daily.    . diphenhydrAMINE (BENADRYL) 12.5 MG/5ML liquid Take 37.5 mg by mouth 4 (four) times daily as needed for allergies.    Marland Kitchen EPINEPHrine 0.3 mg/0.3 mL IJ SOAJ injection Use as directed for severe allergic reaction 2 Device 2  . estradiol (ESTRACE) 0.1 MG/GM vaginal cream 1 gram vaginally twice weekly (Patient taking differently: Place 1 Applicatorful vaginally 3 (three) times a week.) 42.5 g 1  . hydrocortisone (ANUSOL-HC) 2.5 % rectal cream Place 1 Application rectally 4 (four) times  daily. 30 g 1  . losartan-hydrochlorothiazide (HYZAAR) 50-12.5 MG tablet TAKE 1 TABLET BY MOUTH EVERY DAY (Patient taking differently: daily.) 90 tablet 1  . omeprazole (PRILOSEC) 20 MG capsule Take 20 mg by mouth at bedtime.    . sodium chloride (OCEAN) 0.65 % SOLN nasal spray Place 1 spray into both nostrils as needed (dryness).    Marland Kitchen VITAMIN A PO Take 1 capsule by mouth daily.    Marland Kitchen VITAMIN D PO Take 1 capsule by mouth daily.    Marland Kitchen ALPRAZolam (XANAX) 0.5 MG tablet Take 0.5 mg by mouth at bedtime. (Patient not taking: Reported on 12/24/2021)    . betamethasone valerate ointment (VALISONE) 0.1 % Apply 1 application topically 2 (two) times daily. Use for up to 2 weeks as needed (Patient not taking: Reported on 12/24/2021) 30 g 0  . cromolyn (GASTROCROM) 100 MG/5ML solution Take 100 mg by mouth 4 (four) times daily as needed (alphagal symptoms). (Patient not taking: Reported on 12/24/2021)    . Cyanocobalamin (VITAMIN B-12 PO) Take 1 Dose by mouth daily. Liquid b 12 5000 mcg, 1 dropperful per day (Patient not taking: Reported on 12/24/2021)     No facility-administered medications prior to visit.    Review of Systems  Patient denies headache, fevers, malaise, unintentional weight loss, skin rash, eye pain, sinus congestion and sinus pain, sore throat, dysphagia,  hemoptysis , cough, dyspnea, wheezing, chest pain, palpitations, orthopnea, edema, abdominal pain, nausea, melena, diarrhea, constipation, flank pain, dysuria, hematuria, urinary  Frequency, nocturia, numbness, tingling, seizures,  Focal weakness, Loss of consciousness,  Tremor, insomnia, depression, anxiety, and suicidal ideation.     Objective:  BP 138/78 (BP Location: Left Arm, Patient Position: Sitting, Cuff Size: Large)   Pulse 98   Temp 98.1 F (36.7 C) (Oral)   Ht '5\' 8"'$  (1.727 m)   Wt 207 lb 12.8 oz (94.3 kg)   SpO2 95%   BMI 31.60 kg/m   Physical Exam  General appearance: alert, cooperative and appears stated age Head:  Normocephalic, without obvious abnormality, atraumatic Eyes: conjunctivae/corneas clear. PERRL, EOM's intact. Fundi benign. Ears: normal TM's and external ear canals both ears Nose: Nares normal. Septum midline. Mucosa normal. No drainage or sinus tenderness. Throat: lips, mucosa, and tongue normal; teeth and gums normal Neck: no adenopathy, no carotid bruit, no JVD, supple, symmetrical, trachea midline and thyroid not enlarged, symmetric, no tenderness/mass/nodules Lungs: clear to auscultation bilaterally Breasts: normal appearance, no masses or tenderness Heart: regular rate and rhythm, S1, S2 normal, no murmur, click, rub or gallop Abdomen: soft, non-tender; bowel sounds normal; no masses,  no organomegaly Extremities: extremities normal, atraumatic, no cyanosis or edema Pulses: 2+ and symmetric Skin: Skin color, texture, turgor normal. No rashes or lesions Neurologic: Alert and oriented X 3, normal strength and tone. Normal symmetric reflexes. Normal coordination and gait.     Assessment & Plan:   Problem List Items Addressed This Visit     Hyperlipidemia   Essential hypertension - Primary   Other Visit Diagnoses     Fatigue, unspecified type       Impaired fasting glucose           I have discontinued Carmelina Dane. Cloyd's Cyanocobalamin (VITAMIN B-12 PO), betamethasone valerate ointment, and cromolyn. I am also having her maintain her EPINEPHrine, VITAMIN A PO, VITAMIN D PO, estradiol, omeprazole, ammonium lactate, acetaminophen, diphenhydrAMINE, sodium chloride, losartan-hydrochlorothiazide, hydrocortisone, and ALPRAZolam.  No orders of the defined types were placed in this encounter.   Medications Discontinued During This Encounter  Medication Reason  . betamethasone valerate ointment (VALISONE) 0.1 %   . cromolyn (GASTROCROM) 100 MG/5ML solution   . Cyanocobalamin (VITAMIN B-12 PO)   . ALPRAZolam (XANAX) 0.5 MG tablet Change in therapy    Follow-up: No follow-ups on  file.   Crecencio Mc, MD

## 2021-12-25 ENCOUNTER — Encounter: Payer: Self-pay | Admitting: Internal Medicine

## 2021-12-25 DIAGNOSIS — Z8679 Personal history of other diseases of the circulatory system: Secondary | ICD-10-CM | POA: Insufficient documentation

## 2021-12-25 LAB — CBC WITH DIFFERENTIAL/PLATELET
Basophils Absolute: 0.1 10*3/uL (ref 0.0–0.1)
Basophils Relative: 1.3 % (ref 0.0–3.0)
Eosinophils Absolute: 0.1 10*3/uL (ref 0.0–0.7)
Eosinophils Relative: 1.8 % (ref 0.0–5.0)
HCT: 38.9 % (ref 36.0–46.0)
Hemoglobin: 12.9 g/dL (ref 12.0–15.0)
Lymphocytes Relative: 30.9 % (ref 12.0–46.0)
Lymphs Abs: 2.4 10*3/uL (ref 0.7–4.0)
MCHC: 33.2 g/dL (ref 30.0–36.0)
MCV: 95 fl (ref 78.0–100.0)
Monocytes Absolute: 0.6 10*3/uL (ref 0.1–1.0)
Monocytes Relative: 7.5 % (ref 3.0–12.0)
Neutro Abs: 4.6 10*3/uL (ref 1.4–7.7)
Neutrophils Relative %: 58.5 % (ref 43.0–77.0)
Platelets: 333 10*3/uL (ref 150.0–400.0)
RBC: 4.09 Mil/uL (ref 3.87–5.11)
RDW: 13.9 % (ref 11.5–15.5)
WBC: 7.8 10*3/uL (ref 4.0–10.5)

## 2021-12-25 LAB — COMPREHENSIVE METABOLIC PANEL
ALT: 12 U/L (ref 0–35)
AST: 13 U/L (ref 0–37)
Albumin: 4.2 g/dL (ref 3.5–5.2)
Alkaline Phosphatase: 80 U/L (ref 39–117)
BUN: 15 mg/dL (ref 6–23)
CO2: 25 mEq/L (ref 19–32)
Calcium: 9.4 mg/dL (ref 8.4–10.5)
Chloride: 104 mEq/L (ref 96–112)
Creatinine, Ser: 1.03 mg/dL (ref 0.40–1.20)
GFR: 53.42 mL/min — ABNORMAL LOW (ref >60.00)
Glucose, Bld: 121 mg/dL — ABNORMAL HIGH (ref 70–99)
Potassium: 3.5 mEq/L (ref 3.5–5.1)
Sodium: 139 mEq/L (ref 135–145)
Total Bilirubin: 0.3 mg/dL (ref 0.2–1.2)
Total Protein: 7.3 g/dL (ref 6.0–8.3)

## 2021-12-25 LAB — LDL CHOLESTEROL, DIRECT: Direct LDL: 164 mg/dL

## 2021-12-25 LAB — TSH: TSH: 1.41 u[IU]/mL (ref 0.35–5.50)

## 2021-12-25 LAB — LIPID PANEL
Cholesterol: 239 mg/dL — ABNORMAL HIGH (ref 0–200)
HDL: 56.6 mg/dL (ref >39.00)
LDL Cholesterol: 146 mg/dL — ABNORMAL HIGH (ref 0–99)
NonHDL: 181.96
Total CHOL/HDL Ratio: 4
Triglycerides: 178 mg/dL — ABNORMAL HIGH (ref 0.0–149.0)
VLDL: 35.6 mg/dL (ref 0.0–40.0)

## 2021-12-25 LAB — HEMOGLOBIN A1C: Hgb A1c MFr Bld: 6.3 % (ref 4.6–6.5)

## 2021-12-25 NOTE — Addendum Note (Signed)
Addended by: Crecencio Mc on: 12/25/2021 01:03 PM   Modules accepted: Orders

## 2021-12-25 NOTE — Assessment & Plan Note (Signed)
Managed with alprazolam 0.5 mg qhs.  Advised to try to reduce use of benzodiazepine with use of natural supplements

## 2021-12-26 ENCOUNTER — Ambulatory Visit (INDEPENDENT_AMBULATORY_CARE_PROVIDER_SITE_OTHER): Payer: Medicare Other

## 2021-12-26 ENCOUNTER — Encounter: Payer: Self-pay | Admitting: Internal Medicine

## 2021-12-26 ENCOUNTER — Other Ambulatory Visit (INDEPENDENT_AMBULATORY_CARE_PROVIDER_SITE_OTHER): Payer: Medicare Other

## 2021-12-26 DIAGNOSIS — Z23 Encounter for immunization: Secondary | ICD-10-CM | POA: Diagnosis not present

## 2021-12-26 DIAGNOSIS — R102 Pelvic and perineal pain: Secondary | ICD-10-CM

## 2021-12-27 LAB — URINALYSIS, ROUTINE W REFLEX MICROSCOPIC
Bilirubin Urine: NEGATIVE
Hgb urine dipstick: NEGATIVE
Ketones, ur: NEGATIVE
Nitrite: NEGATIVE
Specific Gravity, Urine: 1.01 (ref 1.000–1.030)
Total Protein, Urine: NEGATIVE
Urine Glucose: NEGATIVE
Urobilinogen, UA: 0.2 (ref 0.0–1.0)
pH: 5.5 (ref 5.0–8.0)

## 2021-12-29 ENCOUNTER — Telehealth: Payer: Self-pay | Admitting: Family Medicine

## 2021-12-29 LAB — URINE CULTURE
MICRO NUMBER:: 13975753
SPECIMEN QUALITY:: ADEQUATE

## 2021-12-29 MED ORDER — CEPHALEXIN 500 MG PO CAPS: 500.0000 mg | ORAL_CAPSULE | Freq: Two times a day (BID) | ORAL | 0 refills | Status: AC

## 2021-12-29 MED ORDER — CIPROFLOXACIN HCL 500 MG PO TABS: 500.0000 mg | ORAL_TABLET | Freq: Two times a day (BID) | ORAL | 0 refills | Status: AC

## 2021-12-29 NOTE — Telephone Encounter (Signed)
After calling in prescription based on pt's culture/sensitivity results, pt called back to [facility] and insisted I be contacted b/c she wanted Cipro.  'keflex doesn't work'.  Tried to explain that, per the culture, this would be an effective medication and that we try and avoid Cipro due to the risk of tendon rupture in older adults.  Since pt is insisting on Cipro, will send prescription to pharmacy for her to decide which she wants to take after being given the information to make an informed decision.

## 2021-12-29 NOTE — Telephone Encounter (Signed)
Pt developed worsening abdominal pain, burning w/ urination, low back pain, and temp of 100.4  States this feels like previous UTIs.  She checked MyChart and saw that she did have UTI from urine done earlier this week.  Asking for abx.  Given her multiple allergies and sensitivities, will start Keflex.

## 2021-12-29 NOTE — Addendum Note (Signed)
Addended by: Midge Minium on: 12/29/2021 07:57 PM   Modules accepted: Orders

## 2022-01-01 NOTE — Telephone Encounter (Signed)
Do you want pt to start with the weekly b12 injections or just start doing the monthly injections since she is just switching from oral to injections?

## 2022-01-02 ENCOUNTER — Other Ambulatory Visit (INDEPENDENT_AMBULATORY_CARE_PROVIDER_SITE_OTHER): Payer: Medicare Other

## 2022-01-02 DIAGNOSIS — R944 Abnormal results of kidney function studies: Secondary | ICD-10-CM

## 2022-01-02 LAB — BASIC METABOLIC PANEL
BUN: 14 mg/dL (ref 6–23)
CO2: 28 mEq/L (ref 19–32)
Calcium: 9.1 mg/dL (ref 8.4–10.5)
Chloride: 106 mEq/L (ref 96–112)
Creatinine, Ser: 0.89 mg/dL (ref 0.40–1.20)
GFR: 63.65 mL/min (ref >60.00)
Glucose, Bld: 106 mg/dL — ABNORMAL HIGH (ref 70–99)
Potassium: 4.1 mEq/L (ref 3.5–5.1)
Sodium: 142 mEq/L (ref 135–145)

## 2022-03-08 ENCOUNTER — Other Ambulatory Visit: Payer: Self-pay | Admitting: Internal Medicine

## 2022-03-18 ENCOUNTER — Other Ambulatory Visit: Payer: Self-pay | Admitting: Internal Medicine

## 2022-06-20 ENCOUNTER — Ambulatory Visit
Admission: EM | Admit: 2022-06-20 | Discharge: 2022-06-20 | Disposition: A | Discharge status: Home / Self Care | Payer: Medicare Other | Attending: Urgent Care | Admitting: Urgent Care

## 2022-06-20 DIAGNOSIS — N3001 Acute cystitis with hematuria: Secondary | ICD-10-CM

## 2022-06-20 DIAGNOSIS — R300 Dysuria: Secondary | ICD-10-CM | POA: Diagnosis present

## 2022-06-20 LAB — POCT URINALYSIS DIP (MANUAL ENTRY)
Bilirubin, UA: NEGATIVE
Glucose, UA: NEGATIVE mg/dL
Ketones, POC UA: NEGATIVE mg/dL
Nitrite, UA: POSITIVE — AB
Protein Ur, POC: 30 mg/dL — AB
Spec Grav, UA: >=1.03 — AB (ref 1.010–1.025)
Urobilinogen, UA: 0.2 E.U./dL
pH, UA: 5.5 (ref 5.0–8.0)

## 2022-06-20 MED ORDER — NITROFURANTOIN MONOHYD MACRO 100 MG PO CAPS: 100.0000 mg | ORAL_CAPSULE | Freq: Two times a day (BID) | ORAL | 0 refills | Status: DC

## 2022-06-20 NOTE — ED Provider Notes (Signed)
Roderic Palau    CSN: [id number] Arrival date & time: 06/20/22  1422      History   Chief Complaint Chief Complaint  Patient presents with   Urinary Urgency    HPI Payshence Ringgold is a 77 y.o. female.   HPI  Presents to urgent care with complaint of urinary urgency" bladder pressure" x 1-1/2 weeks.  Past Medical History:  Diagnosis Date   Acid reflux    Allergy to alpha-gal    Anxiety    Bilateral cataracts    Cancer (HCC)    Basal Cell Carcinoma, Melanoma   CIN I (cervical intraepithelial neoplasia I)    Colon polyps    benign   H/O squamous cell carcinoma excision    multiple areas  on body per pt   Hemorrhoid    History of basal cell carcinoma (BCC) excision    History of melanoma excision RIGHT LEG  --  2012   history of SVT (supraventricular tachycardia) ([place]) 10/31/2014   resolved   History of Wolff-Parkinson-White (WPW) syndrome    1996  s/p  ablation   Hypertension    IBS (irritable bowel syndrome)    Lyme disease 2005   Mild carotid artery disease ([place])    per duplex 12-10-2012  bilateral ICA 1-39% stenosis   OsteoArthritis    Sleep apnea    does not use cpap, going back for cpap titration, mild osa per pt   UTI (urinary tract infection)    finished 7 day course on 08-28-2021 all symptoms resolved    Patient Active Problem List   Diagnosis Date Noted   History of Wolff-Parkinson-White (WPW) syndrome 12/25/2021   Post-menopausal bleeding 05/13/2021   Gastroesophageal reflux disease 03/13/2021   Hematochezia 03/13/2021   Personal history of colonic polyps 03/13/2021   OSA (obstructive sleep apnea) 11/09/2020   Edema 09/20/2020   Essential hypertension 09/20/2020   B12 deficiency 06/24/2020   Vitamin D deficiency 06/24/2020   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   Allergy to alpha-gal 06/20/2020   Intestinal malabsorption 06/20/2020   Irritable bowel syndrome with constipation 01/04/2020   Prolapsed internal hemorrhoids,  grade 4 01/04/2020   Internal and external bleeding hemorrhoids 01/04/2020   Melanoma ([place]) 03/22/2019   Heart palpitations 03/22/2019   Hyperlipidemia 04/27/2014   Chronic osteoarthritis 04/27/2014    Past Surgical History:  Procedure Laterality Date   BREAST SURGERY Left 1999   BREAST MASS EXCISED, benign   CARDIAC ELECTROPHYSIOLOGY STUDY AND ABLATION  1996   SUCCESSFUL ABLATION OF WOLFE-PARKINSON-WHITE SYNDROME  (NO ISSUES SINCE)   CATARACT EXTRACTION, BILATERAL     CERVICAL BIOPSY  W/ LOOP ELECTRODE EXCISION  2005   dr gottsegen   COLONOSCOPY     2019 or 2020   [place] N/A 09/17/2021   Procedure: DILATATION & CURETTAGE/HYSTEROSCOPY WITH MYOSURE;  Surgeon: Salvadore Dom, MD;  Location: [place];  Service: Gynecology;  Laterality: N/A;   HYSTEROSCOPY WITH D & C  02/13/2012   Procedure: DILATATION AND CURETTAGE /HYSTEROSCOPY;  Surgeon: Bennetta Laos, MD;  Location: [place];  Service: Gynecology;  Laterality: N/A;  deficit - 710   LAPAROSCOPIC CHOLECYSTECTOMY  05-16-2005  dr Brantley [facility]   melanoma excised     x  3 , 2 on legs and 1 on arm, "not the kind  that spreads"   SVT-CARDIAC ABLATION     TRANSTHORACIC ECHOCARDIOGRAM  12-10-2012  dr Wynonia Lawman   mild LVH, ef 55-60%/  mild MR/  mild LAE and RAE   TUBAL LIGATION     UPPER GI ENDOSCOPY     VULVECTOMY N/A 07/25/2017   Procedure: WIDE EXCISION VULVECTOMY;  Surgeon: Marti Sleigh, MD;  Location[facility];  Service: Gynecology;  Laterality: N/A;    OB History     Gravida  2   Para  2   Term  2   Preterm      AB      Living  2      SAB      IAB      Ectopic      Multiple      Live Births               Home Medications    Prior to Admission medications   Medication Sig Start Date End Date Taking? Authorizing Provider  acetaminophen (TYLENOL) 500 MG tablet Take 1,000 mg by  mouth every 6 (six) hours as needed for moderate pain.    [provider]  ALPRAZolam Duanne Moron) 0.25 MG tablet Take 0.25-0.5 mg by mouth at bedtime as needed. 12/14/21   [provider]  ALPRAZolam Duanne Moron) 0.5 MG tablet TAKE 1 TABLET (0.5 MG TOTAL) BY MOUTH AT BEDTIME AS NEEDED FOR ANXIETY OR SLEEP. 03/19/22   Crecencio Mc, MD  ammonium lactate (LAC-HYDRIN) 12 % lotion Apply 1 application topically daily. 05/14/21   [provider]  diphenhydrAMINE (BENADRYL) 12.5 MG/5ML liquid Take 37.5 mg by mouth 4 (four) times daily as needed for allergies.    [provider]  EPINEPHrine 0.3 mg/0.3 mL IJ SOAJ injection Use as directed for severe allergic reaction 06/10/18   Kennith Gain, MD  estradiol (ESTRACE) 0.1 MG/GM vaginal cream 1 gram vaginally twice weekly Patient taking differently: Place 1 Applicatorful vaginally 3 (three) times a week. 04/06/21   Salvadore Dom, MD  hydrocortisone (ANUSOL-HC) 2.5 % rectal cream Place 1 Application rectally 4 (four) times daily. 11/26/21   Salvadore Dom, MD  losartan-hydrochlorothiazide [facility]) 50-12.5 MG tablet TAKE 1 TABLET BY MOUTH EVERY DAY 03/08/22   Crecencio Mc, MD  omeprazole (PRILOSEC) 20 MG capsule Take 20 mg by mouth at bedtime. 04/26/21   [provider]  sodium chloride (OCEAN) 0.65 % SOLN nasal spray Place 1 spray into both nostrils as needed (dryness).    [provider]  VITAMIN A PO Take 1 capsule by mouth daily.    [provider]  VITAMIN D PO Take 1 capsule by mouth daily.    [provider]    Family History Family History  Problem Relation Age of Onset   Cancer Mother        ESOPHAGEAL CANCER   Diabetes Father    Hyperlipidemia Father    Heart disease Father    Heart attack Father    Heart disease Brother    Breast cancer Paternal Aunt        Age 8's   Hyperlipidemia Paternal Grandmother    Heart disease Paternal Grandmother    Stroke  Paternal Grandmother    Diabetes Paternal Grandfather    Hyperlipidemia Paternal Grandfather    Heart disease Paternal Grandfather    Stroke Paternal Grandfather    Cancer Sister        skin   Stroke Maternal Grandmother    Stroke Maternal Grandfather    Diabetes Brother     Social History Social  History   Tobacco Use   Smoking status: Never   Smokeless tobacco: Never  Vaping Use   Vaping Use: Never used  Substance Use Topics   Alcohol use: Not Currently   Drug use: No     Allergies   Iodinated contrast media, Alpha-gal, Gluten meal, Latex, Milk-related compounds, Onion, Shellfish allergy, Sulfa antibiotics, Trazodone, Azithromycin, and Wheat   Review of Systems Review of Systems   Physical Exam Triage Vital Signs ED Triage Vitals  Enc Vitals Group     BP 06/20/22 1454 (!) 155/82     Pulse Rate 06/20/22 1454 93     Resp 06/20/22 1454 18     Temp 06/20/22 1454 97.9 F (36.6 C)     Temp Source 06/20/22 1454 Temporal     SpO2 06/20/22 1454 100 %     Weight --      Height --      Head Circumference --      Peak Flow --      Pain Score 06/20/22 1455 0     Pain Loc --      Pain Edu? --      Excl. in [place]? --    No data found.  Updated Vital Signs BP (!) 155/82 (BP Location: Left Arm)   Pulse 93   Temp 97.9 F (36.6 C) (Temporal)   Resp 18   SpO2 100%   Visual Acuity Right Eye Distance:   Left Eye Distance:   Bilateral Distance:    Right Eye Near:   Left Eye Near:    Bilateral Near:     Physical Exam Vitals reviewed.  Constitutional:      Appearance: Normal appearance.  Skin:    General: Skin is warm and dry.  Neurological:     General: No focal deficit present.     Mental Status: She is alert and oriented to person, place, and time.  Psychiatric:        Mood and Affect: Mood normal.        Behavior: Behavior normal.      UC Treatments / Results  Labs (all labs ordered are listed, but only abnormal results are displayed) Labs Reviewed   POCT URINALYSIS DIP (MANUAL ENTRY) - Abnormal; Notable for the following components:      Result Value   Clarity, UA cloudy (*)    Spec Grav, UA >=1.030 (*)    Blood, UA trace-intact (*)    Protein Ur, POC =30 (*)    Nitrite, UA Positive (*)    Leukocytes, UA Small (1+) (*)    All other components within normal limits  URINE CULTURE  CERVICOVAGINAL ANCILLARY ONLY    EKG   Radiology No results found.  Procedures Procedures (including critical care time)  Medications Ordered in UC Medications - No data to display  Initial Impression / Assessment and Plan / UC Course  I have reviewed the triage vital signs and the nursing notes.  Pertinent labs & imaging results that were available during my care of the patient were reviewed by me and considered in my medical decision making (see chart for details).   UA result is suggestive of urinary tract infection with small leukocytes present.  Will treat with Macrobid.  GFR greater than 60.  Vaginal swab was obtained with results pending.   Final Clinical Impressions(s) / UC Diagnoses   Final diagnoses:  Dysuria   Discharge Instructions   None    ED Prescriptions   None  PDMP not reviewed this encounter.   Rose Phi, FNP 06/20/22 1520

## 2022-06-20 NOTE — Discharge Instructions (Signed)
Follow up here or with your primary care provider if your symptoms are worsening or not improving with treatment.     

## 2022-06-20 NOTE — ED Triage Notes (Signed)
Patient presents to UC to rule out UTI or yeast. Urinary urgency and bladder pressure x 1.5 weeks. Hx of UTIs.

## 2022-06-21 ENCOUNTER — Other Ambulatory Visit: Payer: Self-pay | Admitting: Internal Medicine

## 2022-06-21 LAB — CERVICOVAGINAL ANCILLARY ONLY
Bacterial Vaginitis (gardnerella): NEGATIVE
Candida Glabrata: NEGATIVE
Candida Vaginitis: NEGATIVE
Chlamydia: NEGATIVE
Comment: NEGATIVE
Comment: NEGATIVE
Comment: NEGATIVE
Comment: NEGATIVE
Comment: NEGATIVE
Comment: NORMAL
Neisseria Gonorrhea: NEGATIVE
Trichomonas: NEGATIVE

## 2022-06-21 NOTE — Telephone Encounter (Signed)
Refilled: 03/19/2022 Last OV: 12/24/2021 Next OV: 06/26/2022

## 2022-06-22 LAB — URINE CULTURE: Culture: >=100000 — AB

## 2022-06-23 MED ORDER — CEPHALEXIN 500 MG PO CAPS: 500.0000 mg | ORAL_CAPSULE | Freq: Two times a day (BID) | ORAL | 0 refills | Status: AC

## 2022-06-23 NOTE — Telephone Encounter (Signed)
Patient contacted Urgent Care regarding results of her urine culture, requesting additional medications to be prescribed.  Medications and results discussed with Provider Barkley Boards NP.   See orders sent into [facility] pharmacy on file.

## 2022-06-24 ENCOUNTER — Other Ambulatory Visit: Payer: Self-pay | Admitting: *Deleted

## 2022-06-24 ENCOUNTER — Telehealth: Payer: Self-pay | Admitting: *Deleted

## 2022-06-24 NOTE — Telephone Encounter (Signed)
Keflex was already sent in by the UC, per pt she didn't pick up rx due to having it before and it didn't work. I advised pt to pick up rx and start and she will follow-up Monday 07/01/22

## 2022-06-24 NOTE — Telephone Encounter (Signed)
This patient is currently on macrobid since Thursday for UTI (seen a walk in clinic). She says it isn't helping and she wants to come in today. Can you see her?   no need, she had a urine culture done that was macrobid resistant   dee, can you please just send her in keflex 500mg  bid x5 days?  1  SV and then she can follow up in clinic if she doesn't get better on the new abx

## 2022-06-26 ENCOUNTER — Other Ambulatory Visit: Payer: Self-pay | Admitting: Internal Medicine

## 2022-06-26 ENCOUNTER — Ambulatory Visit (INDEPENDENT_AMBULATORY_CARE_PROVIDER_SITE_OTHER): Payer: Medicare Other | Admitting: Internal Medicine

## 2022-06-26 ENCOUNTER — Encounter: Payer: Self-pay | Admitting: Internal Medicine

## 2022-06-26 VITALS — BP 116/70 | HR 81 | Temp 98.3°F | Ht 68.0 in | Wt 209.4 lb

## 2022-06-26 DIAGNOSIS — N952 Postmenopausal atrophic vaginitis: Secondary | ICD-10-CM | POA: Diagnosis not present

## 2022-06-26 DIAGNOSIS — E7849 Other hyperlipidemia: Secondary | ICD-10-CM | POA: Diagnosis not present

## 2022-06-26 DIAGNOSIS — N390 Urinary tract infection, site not specified: Secondary | ICD-10-CM | POA: Insufficient documentation

## 2022-06-26 DIAGNOSIS — E559 Vitamin D deficiency, unspecified: Secondary | ICD-10-CM

## 2022-06-26 DIAGNOSIS — I10 Essential (primary) hypertension: Secondary | ICD-10-CM | POA: Diagnosis not present

## 2022-06-26 DIAGNOSIS — Z1211 Encounter for screening for malignant neoplasm of colon: Secondary | ICD-10-CM

## 2022-06-26 DIAGNOSIS — K2100 Gastro-esophageal reflux disease with esophagitis, without bleeding: Secondary | ICD-10-CM

## 2022-06-26 DIAGNOSIS — N950 Postmenopausal bleeding: Secondary | ICD-10-CM

## 2022-06-26 DIAGNOSIS — R7301 Impaired fasting glucose: Secondary | ICD-10-CM | POA: Diagnosis not present

## 2022-06-26 DIAGNOSIS — Z91018 Allergy to other foods: Secondary | ICD-10-CM

## 2022-06-26 DIAGNOSIS — B9689 Other specified bacterial agents as the cause of diseases classified elsewhere: Secondary | ICD-10-CM

## 2022-06-26 DIAGNOSIS — E538 Deficiency of other specified B group vitamins: Secondary | ICD-10-CM

## 2022-06-26 DIAGNOSIS — Z1231 Encounter for screening mammogram for malignant neoplasm of breast: Secondary | ICD-10-CM

## 2022-06-26 DIAGNOSIS — E785 Hyperlipidemia, unspecified: Secondary | ICD-10-CM

## 2022-06-26 MED ORDER — HYDROCORTISONE (PERIANAL) 2.5 % EX CREA: 1.0000 | TOPICAL_CREAM | Freq: Four times a day (QID) | CUTANEOUS | 1 refills | Status: DC

## 2022-06-26 MED ORDER — LOSARTAN POTASSIUM-HCTZ 50-12.5 MG PO TABS: 1.0000 | ORAL_TABLET | Freq: Every day | ORAL | 1 refills | Status: DC

## 2022-06-26 MED ORDER — ESTRADIOL 0.1 MG/GM VA CREA: TOPICAL_CREAM | VAGINAL | 1 refills | Status: DC

## 2022-06-26 MED ORDER — EPINEPHRINE 0.3 MG/0.3ML IJ SOAJ: INTRAMUSCULAR | 2 refills | Status: AC

## 2022-06-26 NOTE — Assessment & Plan Note (Signed)
Sensitive to cephalexin.  Continue 7 day course.

## 2022-06-26 NOTE — Patient Instructions (Signed)
The book I referred to today when we were discussing your GI issues is Dr Alexandra Bennett's book "AIP Diet for Beginners: A Comprehensive Guide to the Autoimmune Paleo protocol"     

## 2022-06-26 NOTE — Progress Notes (Unsigned)
Subjective:  Patient ID: Anna [name], female    DOB: 13-Apr-1945  Age: 77 y.o. MRN: [id number]  CC: The primary encounter diagnosis was Colon cancer screening. Diagnoses of Essential hypertension, Other hyperlipidemia, Impaired fasting glucose, Atrophic vaginitis, and Encounter for screening mammogram for malignant neoplasm of breast were also pertinent to this visit.   HPI Anna [name] presents for  Chief Complaint  Patient presents with   Medical Management of Chronic Issues    6 month follow up     1) recurrent UTI :  1st testing in 6 months   started keflex 2 days ago, was given macrobid initially  (culture data reviewed).  Seeing Hollice Espy for repeat evaluation next week Feels she never has complete resolution of infection due to persistent bloating and cramping  2) blood in stools:  repeat colonoscopy needed has hemorrhoids .    3)  GLUTEN ALLERGIES BEING RETESTED TOMORROW.  DIFFICULT TO FOLLOW A HIGH FIBER DIET DUE TO FOOD ALLERGIES .  EGG ADDED     Outpatient Medications Prior to Visit  Medication Sig Dispense Refill   acetaminophen (TYLENOL) 500 MG tablet Take 1,000 mg by mouth every 6 (six) hours as needed for moderate pain.     ALPRAZolam (XANAX) 0.5 MG tablet TAKE 1 TABLET (0.5 MG TOTAL) BY MOUTH AT BEDTIME AS NEEDED FOR ANXIETY OR SLEEP. 30 tablet 0   cephALEXin (KEFLEX) 500 MG capsule Take 1 capsule (500 mg total) by mouth 2 (two) times daily for 5 days. 10 capsule 0   diphenhydrAMINE (BENADRYL) 12.5 MG/5ML liquid Take 37.5 mg by mouth 4 (four) times daily as needed for allergies.     omeprazole (PRILOSEC) 20 MG capsule Take 20 mg by mouth at bedtime.     sodium chloride (OCEAN) 0.65 % SOLN nasal spray Place 1 spray into both nostrils as needed (dryness).     VITAMIN D PO Take 1 capsule by mouth daily.     EPINEPHrine 0.3 mg/0.3 mL IJ SOAJ injection Use as directed for severe allergic reaction 2 Device 2   estradiol (ESTRACE) 0.1 MG/GM vaginal cream 1  gram vaginally twice weekly (Patient taking differently: Place 1 Applicatorful vaginally 3 (three) times a week.) 42.5 g 1   hydrocortisone (ANUSOL-HC) 2.5 % rectal cream Place 1 Application rectally 4 (four) times daily. 30 g 1   ALPRAZolam (XANAX) 0.25 MG tablet Take 0.25-0.5 mg by mouth at bedtime as needed. (Patient not taking: Reported on 06/26/2022)     ammonium lactate (LAC-HYDRIN) 12 % lotion Apply 1 application topically daily. (Patient not taking: Reported on 06/26/2022)     losartan-hydrochlorothiazide (HYZAAR) 50-12.5 MG tablet TAKE 1 TABLET BY MOUTH EVERY DAY 90 tablet 1   nitrofurantoin, macrocrystal-monohydrate, (MACROBID) 100 MG capsule Take 1 capsule (100 mg total) by mouth 2 (two) times daily. (Patient not taking: Reported on 06/26/2022) 10 capsule 0   VITAMIN A PO Take 1 capsule by mouth daily. (Patient not taking: Reported on 06/26/2022)     No facility-administered medications prior to visit.    Review of Systems;  Patient denies headache, fevers, malaise, unintentional weight loss, skin rash, eye pain, sinus congestion and sinus pain, sore throat, dysphagia,  hemoptysis , cough, dyspnea, wheezing, chest pain, palpitations, orthopnea, edema, abdominal pain, nausea, melena, diarrhea, constipation, flank pain, dysuria, hematuria, urinary  Frequency, nocturia, numbness, tingling, seizures,  Focal weakness, Loss of consciousness,  Tremor, insomnia, depression, anxiety, and suicidal ideation.      Objective:  BP 116/70  Pulse 81   Temp 98.3 F (36.8 C) (Oral)   Ht 5\' 8"  (1.727 m)   Wt 209 lb 6.4 oz (95 kg)   SpO2 96%   BMI 31.84 kg/m   BP Readings from Last 3 Encounters:  06/26/22 116/70  06/20/22 (!) 155/82  12/24/21 138/78    Wt Readings from Last 3 Encounters:  06/26/22 209 lb 6.4 oz (95 kg)  12/24/21 207 lb 12.8 oz (94.3 kg)  09/17/21 210 lb (95.3 kg)    Physical Exam  Lab Results  Component Value Date   HGBA1C 6.3 12/24/2021    Lab Results  Component  Value Date   CREATININE 0.89 01/02/2022   CREATININE 1.03 12/24/2021   CREATININE 0.70 09/17/2021    Lab Results  Component Value Date   WBC 7.8 12/24/2021   HGB 12.9 12/24/2021   HCT 38.9 12/24/2021   PLT 333.0 12/24/2021   GLUCOSE 106 (H) 01/02/2022   CHOL 239 (H) 12/24/2021   TRIG 178.0 (H) 12/24/2021   HDL 56.60 12/24/2021   LDLDIRECT 164.0 12/24/2021   LDLCALC 146 (H) 12/24/2021   ALT 12 12/24/2021   AST 13 12/24/2021   NA 142 01/02/2022   K 4.1 01/02/2022   CL 106 01/02/2022   CREATININE 0.89 01/02/2022   BUN 14 01/02/2022   CO2 28 01/02/2022   TSH 1.41 12/24/2021   INR 1.03 12/10/2012   HGBA1C 6.3 12/24/2021   MICROALBUR 4.2 (H) 09/19/2020    No results found.  Assessment & Plan:  .Colon cancer screening -     Ambulatory referral to Gastroenterology  Essential hypertension -     Comprehensive metabolic panel -     Microalbumin / creatinine urine ratio  Other hyperlipidemia -     Lipid panel -     LDL cholesterol, direct  Impaired fasting glucose -     Comprehensive metabolic panel -     Hemoglobin A1c  Atrophic vaginitis -     Estradiol; 1 gram vaginally twice weekly  Dispense: 42.5 g; Refill: 1  Encounter for screening mammogram for malignant neoplasm of breast -     3D Screening Mammogram, Left and Right; Future  Other orders -     Losartan Potassium-HCTZ; Take 1 tablet by mouth daily.  Dispense: 90 tablet; Refill: 1 -     EPINEPHrine; Use as directed for severe allergic reaction  Dispense: 2 each; Refill: 2 -     Hydrocortisone (Perianal); Place 1 Application rectally 4 (four) times daily.  Dispense: 30 g; Refill: 1     I provided 30 minutes of face-to-face time during this encounter reviewing patient's last visit with me, patient's  most recent visit with cardiology,  nephrology,  and neurology,  recent surgical and non surgical procedures, previous  labs and imaging studies, counseling on currently addressed issues,  and post visit ordering  to diagnostics and therapeutics .   Follow-up: No follow-ups on file.   Crecencio Mc, MD

## 2022-06-27 LAB — COMPREHENSIVE METABOLIC PANEL
ALT: 19 IU/L (ref 0–32)
AST: 19 IU/L (ref 0–40)
Albumin/Globulin Ratio: 1.6 (ref 1.2–2.2)
Albumin: 4.4 g/dL (ref 3.8–4.8)
Alkaline Phosphatase: 108 IU/L (ref 44–121)
BUN/Creatinine Ratio: 15 (ref 12–28)
BUN: 13 mg/dL (ref 8–27)
Bilirubin Total: 0.4 mg/dL (ref 0.0–1.2)
CO2: 24 mmol/L (ref 20–29)
Calcium: 9.5 mg/dL (ref 8.7–10.3)
Chloride: 102 mmol/L (ref 96–106)
Creatinine, Ser: 0.88 mg/dL (ref 0.57–1.00)
Globulin, Total: 2.8 g/dL (ref 1.5–4.5)
Glucose: 104 mg/dL — ABNORMAL HIGH (ref 70–99)
Potassium: 4.1 mmol/L (ref 3.5–5.2)
Sodium: 141 mmol/L (ref 134–144)
Total Protein: 7.2 g/dL (ref 6.0–8.5)
eGFR: 68 mL/min/{1.73_m2} (ref >59)

## 2022-06-27 LAB — LIPID PANEL
Chol/HDL Ratio: 4.4 ratio (ref 0.0–4.4)
Cholesterol, Total: 258 mg/dL — ABNORMAL HIGH (ref 100–199)
HDL: 59 mg/dL (ref >39)
LDL Chol Calc (NIH): 170 mg/dL — ABNORMAL HIGH (ref 0–99)
Triglycerides: 161 mg/dL — ABNORMAL HIGH (ref 0–149)
VLDL Cholesterol Cal: 29 mg/dL (ref 5–40)

## 2022-06-27 LAB — MICROALBUMIN / CREATININE URINE RATIO
Creatinine, Urine: 115 mg/dL
Microalb/Creat Ratio: 8 mg/g creat (ref 0–29)
Microalbumin, Urine: 9.3 ug/mL

## 2022-06-27 LAB — HEMOGLOBIN A1C
Est. average glucose Bld gHb Est-mCnc: 131 mg/dL
Hgb A1c MFr Bld: 6.2 % — ABNORMAL HIGH (ref 4.8–5.6)

## 2022-06-27 LAB — LDL CHOLESTEROL, DIRECT: LDL Direct: 162 mg/dL — ABNORMAL HIGH (ref 0–99)

## 2022-06-27 NOTE — Assessment & Plan Note (Addendum)
Her hysteroscopy  was done in Feb 2023  and an endometrial polyp was suggested  with inactive endometrium

## 2022-06-27 NOTE — Assessment & Plan Note (Signed)
Well controlled on current regimen of losartan/hct at 50/12.5 mg dose. Lytes and  Renal function are normal,  no changes today.  Lab Results  Component Value Date   CREATININE 0.88 06/26/2022   Lab Results  Component Value Date   NA 141 06/26/2022   K 4.1 06/26/2022   CL 102 06/26/2022   CO2 24 06/26/2022

## 2022-06-30 ENCOUNTER — Encounter: Payer: Self-pay | Admitting: Internal Medicine

## 2022-06-30 NOTE — Assessment & Plan Note (Addendum)
10 yr risk of CAD using the AHA risk calculator is 16%.  Patient has  incidental evidence of thoracic and aortic atherosclerosis noted on prior post traumatic CT done in 2022  will dicuss trial of statin therapy at next visit

## 2022-07-01 ENCOUNTER — Encounter: Payer: Self-pay | Admitting: Physician Assistant

## 2022-07-01 ENCOUNTER — Ambulatory Visit (INDEPENDENT_AMBULATORY_CARE_PROVIDER_SITE_OTHER): Payer: Medicare Other | Admitting: Physician Assistant

## 2022-07-01 VITALS — BP 164/84 | HR 81 | Ht 68.0 in | Wt 208.6 lb

## 2022-07-01 DIAGNOSIS — R351 Nocturia: Secondary | ICD-10-CM | POA: Diagnosis not present

## 2022-07-01 DIAGNOSIS — N3281 Overactive bladder: Secondary | ICD-10-CM | POA: Diagnosis not present

## 2022-07-01 DIAGNOSIS — R3915 Urgency of urination: Secondary | ICD-10-CM | POA: Diagnosis not present

## 2022-07-01 DIAGNOSIS — N390 Urinary tract infection, site not specified: Secondary | ICD-10-CM

## 2022-07-01 DIAGNOSIS — R300 Dysuria: Secondary | ICD-10-CM | POA: Diagnosis not present

## 2022-07-01 LAB — URINALYSIS, COMPLETE
Bilirubin, UA: NEGATIVE
Glucose, UA: NEGATIVE
Ketones, UA: NEGATIVE
Nitrite, UA: NEGATIVE
Protein,UA: NEGATIVE
RBC, UA: NEGATIVE
Specific Gravity, UA: 1.02 (ref 1.005–1.030)
Urobilinogen, Ur: 0.2 mg/dL (ref 0.2–1.0)
pH, UA: 5 (ref 5.0–7.5)

## 2022-07-01 LAB — MICROSCOPIC EXAMINATION: Epithelial Cells (non renal): >10 /hpf — AB (ref 0–10)

## 2022-07-01 LAB — BLADDER SCAN AMB NON-IMAGING

## 2022-07-01 MED ORDER — GEMTESA 75 MG PO TABS: 75.0000 mg | ORAL_TABLET | Freq: Every day | ORAL | 0 refills | Status: DC

## 2022-07-01 NOTE — Patient Instructions (Signed)
Recurrent UTI Prevention Strategies  Start taking an over-the-counter cranberry supplement for urinary tract health. Take this once or twice daily on an empty stomach, e.g. right before bed. Start taking an over-the-counter d-mannose supplement. Take this daily per packaging instructions. Start taking an over-the-counter probiotic containing the bacterial species called Lactobacillus. Take this daily. Continue vaginal estrogen cream. Apply a pea-sized amount around the opening of the urethra three times weekly forever.

## 2022-07-01 NOTE — Progress Notes (Signed)
07/01/2022 10:32 AM   Anna [name] [birth year] [id number]  CC: Chief Complaint  Patient presents with   Urinary Tract Infection   HPI: Anna [name] is a 77 y.o. female with PMH recurrent UTI and atrophic vaginitis who presents today for follow-up of UTI.   She was seen at [facility] on 06/20/2022 with reports of 10 days of urinary urgency and bladder pressure.  UA dip was nitrite positive with 1+ leukocytes and trace intact blood.  She was started on empiric Macrobid, however her urine culture finalized with ampicillin and Macrobid resistant Klebsiella pneumoniae.  She was subsequently switched to Keflex 500 mg twice daily x 5 days.  Today she reports she completed Keflex yesterday, however she continues to have some dysuria, stable compared to prior.  She was seen in clinic most recently by Dr. Erlene Quan on 05/08/2021.  She states she is overdue for follow-up, but has been having frequent UTI symptoms and seeking care for these with her PCP or at walk-in clinics due to the timing of symptom onset, usually around the weekend.    Overall she describes chronic bladder pressure, bloating, frequency every 2 hours, nocturia x 5-6, strong urgency, and rare stress and urge incontinence, for which she wears 2 pads daily, which are damp.  She has intermittent dysuria and fatigue which she is unsure is related to her urinary symptoms.  She denies a history of nephrolithiasis or gross hematuria.  She has been diagnosed with mild sleep apnea, but does not use CPAP.  She reports she feels like her bladder symptoms never really clear up.  She uses topical vaginal estrogen cream daily and takes align 2-3 times per week.  Per chart review, she has grown Klebsiella pneumoniae 4 times over the past year.  In-office UA today positive for 1+ leukocytes; urine microscopy with 11-30 WBCs/HPF and >10 epithelial cells/hpf. PVR 1mL.  PMH: Past Medical History:  Diagnosis Date    Acid reflux    Allergy to alpha-gal    Anxiety    Bilateral cataracts    Cancer    Basal Cell Carcinoma, Melanoma   CIN I (cervical intraepithelial neoplasia I)    Colon polyps    benign   H/O squamous cell carcinoma excision    multiple areas  on body per pt   Hemorrhoid    History of basal cell carcinoma (BCC) excision    History of melanoma excision RIGHT LEG  --  2012   history of SVT (supraventricular tachycardia) ([place]) 10/31/2014   resolved   History of Wolff-Parkinson-White (WPW) syndrome    1996  s/p  ablation   Hypertension    IBS (irritable bowel syndrome)    Lyme disease 2005   Mild carotid artery disease    per duplex 12-10-2012  bilateral ICA 1-39% stenosis   OsteoArthritis    Sleep apnea    does not use cpap, going back for cpap titration, mild osa per pt   UTI (urinary tract infection)    finished 7 day course on 08-28-2021 all symptoms resolved    Surgical History: Past Surgical History:  Procedure Laterality Date   BREAST SURGERY Left 1999   BREAST MASS EXCISED, benign   CARDIAC ELECTROPHYSIOLOGY STUDY AND ABLATION  1996   SUCCESSFUL ABLATION OF WOLFE-PARKINSON-WHITE SYNDROME  (NO ISSUES SINCE)   CATARACT EXTRACTION, BILATERAL     CERVICAL BIOPSY  W/ LOOP ELECTRODE EXCISION  2005   dr gottsegen   COLONOSCOPY  2019 or [place] N/A 09/17/2021   Procedure: DILATATION & CURETTAGE/HYSTEROSCOPY WITH MYOSURE;  Surgeon: Salvadore Dom, MD;  Location: [facility];  Service: Gynecology;  Laterality: N/A;   HYSTEROSCOPY WITH D & C  02/13/2012   Procedure: DILATATION AND CURETTAGE /HYSTEROSCOPY;  Surgeon: Bennetta Laos, MD;  Location: [place];  Service: Gynecology;  Laterality: N/A;  deficit - [place]  05-16-2005  dr Brantley Stage [facility]   melanoma excised     x  3 , 2 on legs and 1 on arm, "not the kind  that spreads"   SVT-CARDIAC  ABLATION     TRANSTHORACIC ECHOCARDIOGRAM  12-10-2012   dr Wynonia Lawman   mild LVH, ef 55-60%/  mild MR/  mild LAE and RAE   TUBAL LIGATION     UPPER GI ENDOSCOPY     VULVECTOMY N/A 07/25/2017   Procedure: WIDE EXCISION VULVECTOMY;  Surgeon: Marti Sleigh, MD;  Location: [facility];  Service: Gynecology;  Laterality: N/A;    Home Medications:  Allergies as of 07/01/2022       Reactions   Iodinated Contrast Media Hypertension   Patient experienced elevated heart rate, hypertension and had to be taken to the ED   Alpha-gal    facial redness, upset stomach   Gluten Meal Dermatitis   Bloating    Latex Other (See Comments)   Milk-related Compounds    facial flushing,  upset stomach, diarrhea   Onion    Other reaction(s): Not available   Shellfish Allergy Hives   Sulfa Antibiotics Hives, Nausea Only, Other (See Comments)   Trazodone    Severe sleepiness   Azithromycin Diarrhea, Palpitations, Other (See Comments)   Cramping    Shellfish-derived Products    Other Reaction(s): abdominal pain   Wheat Rash   Stomach pain        Medication List        Accurate as of July 01, 2022 10:32 AM. If you have any questions, ask your nurse or doctor.          acetaminophen 500 MG tablet Commonly known as: TYLENOL Take 1,000 mg by mouth every 6 (six) hours as needed for moderate pain.   ALPRAZolam 0.5 MG tablet Commonly known as: XANAX TAKE 1 TABLET (0.5 MG TOTAL) BY MOUTH AT BEDTIME AS NEEDED FOR ANXIETY OR SLEEP.   diphenhydrAMINE 12.5 MG/5ML liquid Commonly known as: BENADRYL Take 37.5 mg by mouth 4 (four) times daily as needed for allergies.   EPINEPHrine 0.3 mg/0.3 mL Soaj injection Commonly known as: EPI-PEN Use as directed for severe allergic reaction   estradiol 0.1 MG/GM vaginal cream Commonly known as: ESTRACE 1 gram vaginally twice weekly   hydrocortisone 2.5 % rectal cream Commonly known as: ANUSOL-HC Place 1 Application rectally 4 (four)  times daily.   losartan-hydrochlorothiazide 50-12.5 MG tablet Commonly known as: HYZAAR Take 1 tablet by mouth daily.   omeprazole 20 MG capsule Commonly known as: PRILOSEC Take 20 mg by mouth at bedtime.   sodium chloride 0.65 % Soln nasal spray Commonly known as: OCEAN Place 1 spray into both nostrils as needed (dryness).   VITAMIN D PO Take 1 capsule by mouth daily.        Allergies:  Allergies  Allergen Reactions   Iodinated Contrast Media Hypertension    Patient experienced elevated heart rate, hypertension and had to be taken to the ED  Alpha-Gal     facial redness, upset stomach   Gluten Meal Dermatitis    Bloating    Latex Other (See Comments)   Milk-Related Compounds     facial flushing,  upset stomach, diarrhea   Onion     Other reaction(s): Not available   Shellfish Allergy Hives   Sulfa Antibiotics Hives, Nausea Only and Other (See Comments)   Trazodone     Severe sleepiness   Azithromycin Diarrhea, Palpitations and Other (See Comments)    Cramping    Shellfish-Derived Products     Other Reaction(s): abdominal pain   Wheat Rash    Stomach pain    Family History: Family History  Problem Relation Age of Onset   Cancer Mother        ESOPHAGEAL CANCER   Diabetes Father    Hyperlipidemia Father    Heart disease Father    Heart attack Father    Heart disease Brother    Breast cancer Paternal Aunt        Age 48's   Hyperlipidemia Paternal Grandmother    Heart disease Paternal Grandmother    Stroke Paternal Grandmother    Diabetes Paternal Grandfather    Hyperlipidemia Paternal Grandfather    Heart disease Paternal Grandfather    Stroke Paternal Grandfather    Cancer Sister        skin   Stroke Maternal Grandmother    Stroke Maternal Grandfather    Diabetes Brother     Social History:   reports that she has never smoked. She has never used smokeless tobacco. She reports that she does not currently use alcohol. She reports that she does  not use drugs.  Physical Exam: BP (!) 164/84   Pulse 81   Ht 5\' 8"  (1.727 m)   Wt 208 lb 9.6 oz (94.6 kg)   BMI 31.72 kg/m   Constitutional:  Alert and oriented, no acute distress, nontoxic appearing HEENT: [place], AT Cardiovascular: No clubbing, cyanosis, or edema Respiratory: Normal respiratory effort, no increased work of breathing Skin: No rashes, bruises or suspicious lesions Neurologic: Grossly intact, no focal deficits, moving all 4 extremities Psychiatric: Normal mood and affect  Laboratory Data: Results for orders placed or performed in visit on 07/01/22  Microscopic Examination   Urine  Result Value Ref Range   WBC, UA 11-30 (A) 0 - 5 /hpf   RBC, Urine 0-2 0 - 2 /hpf   Epithelial Cells (non renal) >10 (A) 0 - 10 /hpf   Bacteria, UA Few None seen/Few  Urinalysis, Complete  Result Value Ref Range   Specific Gravity, UA 1.020 1.005 - 1.030   pH, UA 5.0 5.0 - 7.5   Color, UA Yellow Yellow   Appearance Ur Hazy (A) Clear   Leukocytes,UA 1+ (A) Negative   Protein,UA Negative Negative/Trace   Glucose, UA Negative Negative   Ketones, UA Negative Negative   RBC, UA Negative Negative   Bilirubin, UA Negative Negative   Urobilinogen, Ur 0.2 0.2 - 1.0 mg/dL   Nitrite, UA Negative Negative   Microscopic Examination See below:   BLADDER SCAN AMB NON-IMAGING  Result Value Ref Range   Scan Result 31ml    Assessment & Plan:   1. Recurrent UTI Recurrent UTI versus chronic Klebsiella urinary colonization.  Chronic underlying bladder symptoms including urgency, frequency, nocturia, and intermittent dysuria, timing unclear relative to positive cultures.  She is emptying appropriately.  Will obtain renal ultrasound and cystoscopy for further evaluation.  UA today appears contaminated,  low suspicion for persistent UTI given culture appropriate therapy completed yesterday.  We discussed UTI prevention recommendations including continuing vaginal estrogen cream, starting cranberry and  d-mannose supplements, and starting a lactobacillus containing probiotic. - Urinalysis, Complete - BLADDER SCAN AMB NON-IMAGING - US RENAL; Future - CULTURE, URINE COMPREHENSIVE  2. OAB (overactive bladder) Chronic urgency, frequency, nocturia, SUI, and UUI consistent with OAB wet with mixed urge and stress incontinence.  We discussed the significant symptomatic overlap between recurrent UTIs and OAB.  I recommended starting an OAB medication, samples of Gemtesa x 6 weeks provided today. - Vibegron (GEMTESA) 75 MG TABS; Take 1 tablet (75 mg total) by mouth daily.  Dispense: 42 tablet; Refill: 0  3. Nocturia We discussed that nocturia may be related to #2, however her sleep apnea is very likely also playing a role here.  May consider pursuing CPAP in the future based on symptom bother.  I spent 45 minutes on the day of the encounter to include pre-visit record review, face-to-face time with the patient, and post-visit ordering of tests.   Return in about 4 weeks (around 07/29/2022) for Cysto, RUS results, sx recheck with PVR with Dr. Erlene Quan.  Debroah Loop, PA-C  [facility] [place] [address] [place], [place] [address] [phone]

## 2022-07-04 LAB — CULTURE, URINE COMPREHENSIVE

## 2022-07-10 ENCOUNTER — Telehealth: Payer: Self-pay

## 2022-07-10 NOTE — Telephone Encounter (Signed)
Pt Anna [name] on triage voicemail requesting results of urine cx.  Called pt informed her of negative urine culture. Pt voiced understanding.

## 2022-07-12 ENCOUNTER — Ambulatory Visit
Admission: RE | Admit: 2022-07-12 | Discharge: 2022-07-12 | Disposition: A | Discharge status: Home / Self Care | Payer: Medicare Other | Source: Ambulatory Visit | Attending: Physician Assistant | Admitting: Physician Assistant

## 2022-07-12 DIAGNOSIS — N390 Urinary tract infection, site not specified: Secondary | ICD-10-CM | POA: Diagnosis present

## 2022-07-20 ENCOUNTER — Other Ambulatory Visit: Payer: Self-pay | Admitting: Family

## 2022-07-25 ENCOUNTER — Other Ambulatory Visit: Payer: Self-pay | Admitting: Internal Medicine

## 2022-07-25 MED ORDER — ALPRAZOLAM 0.5 MG PO TABS: 0.5000 mg | ORAL_TABLET | Freq: Every evening | ORAL | 5 refills | Status: DC | PRN

## 2022-07-25 NOTE — Telephone Encounter (Signed)
Pt need a refill on ALPRAZolam sent to [facility]

## 2022-07-25 NOTE — Telephone Encounter (Signed)
Refilled: 06/21/2022 Last OV: 06/26/2022 Next OV: 08/14/2022

## 2022-07-30 ENCOUNTER — Ambulatory Visit (INDEPENDENT_AMBULATORY_CARE_PROVIDER_SITE_OTHER): Payer: Medicare Other | Admitting: Urology

## 2022-07-30 VITALS — BP 128/83 | HR 81 | Ht 68.0 in | Wt 208.4 lb

## 2022-07-30 DIAGNOSIS — N390 Urinary tract infection, site not specified: Secondary | ICD-10-CM

## 2022-07-30 DIAGNOSIS — Z87440 Personal history of urinary (tract) infections: Secondary | ICD-10-CM

## 2022-07-30 DIAGNOSIS — N3281 Overactive bladder: Secondary | ICD-10-CM

## 2022-07-30 LAB — URINALYSIS, COMPLETE
Bilirubin, UA: NEGATIVE
Glucose, UA: NEGATIVE
Ketones, UA: NEGATIVE
Nitrite, UA: NEGATIVE
Protein,UA: NEGATIVE
RBC, UA: NEGATIVE
Specific Gravity, UA: 1.02 (ref 1.005–1.030)
Urobilinogen, Ur: 0.2 mg/dL (ref 0.2–1.0)
pH, UA: 5.5 (ref 5.0–7.5)

## 2022-07-30 LAB — MICROSCOPIC EXAMINATION: Epithelial Cells (non renal): >10 /hpf — AB (ref 0–10)

## 2022-07-30 NOTE — Progress Notes (Signed)
   07/30/22  CC:  Chief Complaint  Patient presents with   Cysto    HPI: 77 year old female who presents today for further evaluation of recurrent UTIs.  Please see previous notes for details.  She did have a renal ultrasound on 07/12/2022 which was unremarkable.  Notably, she was given Gemtesa samples at her last appointment for urinary frequency type symptoms.  She does think that this is helped especially with her urgency type symptoms.  Blood pressure 128/83, pulse 81, height 5\' 8"  (1.727 m), weight 208 lb 6 oz (94.5 kg). NED. A&Ox3.   No respiratory distress   Abd soft, NT, ND Normal external genitalia with patent urethral meatus  Cystoscopy Procedure Note  Patient identification was confirmed, informed consent was obtained, and patient was prepped using Betadine solution.  Lidocaine jelly was administered per urethral meatus.    Procedure: - Flexible cystoscope introduced, without any difficulty.   - Thorough search of the bladder revealed:    normal urethral meatus    normal urothelium    no stones    no ulcers     no tumors    no urethral polyps    no trabeculation  - Ureteral orifices were normal in position and appearance.  Post-Procedure: - Patient tolerated the procedure well  Assessment/ Plan:  1. Recurrent UTI Cystoscopy and renal ultrasound were unremarkable, no obvious contributing factors  Continue topical estrogen cream at least 3 times per month  Follow-up if she has any UTI type symptoms - Urinalysis, Complete  2. OAB (overactive bladder) Has done well with Gemtesa 75 mg, will prescribe this  If prohibitively expensive, will seek to find other alternative - Urinalysis, Complete    F/u 1 year Sam   Vanna Scotland, MD

## 2022-08-14 ENCOUNTER — Ambulatory Visit: Payer: Medicare Other | Admitting: Internal Medicine

## 2022-08-14 ENCOUNTER — Encounter: Payer: Self-pay | Admitting: Internal Medicine

## 2022-08-14 ENCOUNTER — Ambulatory Visit: Payer: BLUE CROSS/BLUE SHIELD | Attending: Internal Medicine

## 2022-08-14 VITALS — BP 130/84 | HR 73 | Temp 97.7°F | Ht 68.0 in | Wt 207.0 lb

## 2022-08-14 DIAGNOSIS — E559 Vitamin D deficiency, unspecified: Secondary | ICD-10-CM

## 2022-08-14 DIAGNOSIS — N3281 Overactive bladder: Secondary | ICD-10-CM | POA: Diagnosis not present

## 2022-08-14 DIAGNOSIS — R42 Dizziness and giddiness: Secondary | ICD-10-CM

## 2022-08-14 DIAGNOSIS — K30 Functional dyspepsia: Secondary | ICD-10-CM

## 2022-08-14 DIAGNOSIS — R140 Abdominal distension (gaseous): Secondary | ICD-10-CM

## 2022-08-14 DIAGNOSIS — N390 Urinary tract infection, site not specified: Secondary | ICD-10-CM | POA: Diagnosis not present

## 2022-08-14 DIAGNOSIS — E538 Deficiency of other specified B group vitamins: Secondary | ICD-10-CM

## 2022-08-14 DIAGNOSIS — G4733 Obstructive sleep apnea (adult) (pediatric): Secondary | ICD-10-CM

## 2022-08-14 DIAGNOSIS — R5383 Other fatigue: Secondary | ICD-10-CM

## 2022-08-14 DIAGNOSIS — Z1211 Encounter for screening for malignant neoplasm of colon: Secondary | ICD-10-CM

## 2022-08-14 NOTE — Assessment & Plan Note (Signed)
Improved with gemtesa (urology)

## 2022-08-14 NOTE — Patient Instructions (Signed)
I have ordered a "ZIO Monitor"  to observe and record your heart rhythms .  It will be sent to your home .  If you have any trouble figuring out how to put it on,  call the office and schedule a RN visit    Home sleep study ordered to evaluate your sleep apnea and START TREATING IT.   Gastric emptying study to evaluate how efficiently your stomach is processing food

## 2022-08-14 NOTE — Progress Notes (Addendum)
Subjective:  Patient ID: Anna [name], female    DOB: January 13, 1946  Age: 77 y.o. MRN: [id number]  CC: The primary encounter diagnosis was Colon cancer screening. Diagnoses of Recurrent UTI, Overactive bladder, B12 deficiency, Vitamin D deficiency, Dizziness, OSA (obstructive sleep apnea), Functional dyspepsia, Other fatigue, and Bloating symptom were also pertinent to this visit.   HPI [place] Matczak presents for  Chief Complaint  Patient presents with   Medical Management of Chronic Issues   1) lifeline screening done  all reportedly normal.    2) Profound fatigue limiting social interactions and activities. Denies depression.    Gets light headed when she walks.  Afraid to walk /fear of falling.  .  Feels like her head is in a fog. History of cardiac ablation for WPW in 1996.  Last cardiology appt was 2023 [facility]) .  Could not walk the treadmill stress last year.  Has UNTREATED OSA diagnosed 2 years ago.  Never returned for the CPAP  3)  has appt with GI Dr Dulce Sellar at [facility] June for EGD/colonoscopy.  Has abdominal pain/fullness that occurs  after eating anything.  Taking omeprazole,  has tried bid, tried taking omeprazole  and famotidine  .  No change taking 20 mg qhs   no prior Gastric emptying study   4) Food allergies:  multiple complicated by   alpha gal:  following diet   5) HLD  does not  want to start statin for 10 yr risk of 16% Outpatient Medications Prior to Visit  Medication Sig Dispense Refill   acetaminophen (TYLENOL) 500 MG tablet Take 1,000 mg by mouth every 6 (six) hours as needed for moderate pain.     ALPRAZolam (XANAX) 0.5 MG tablet Take 1 tablet (0.5 mg total) by mouth at bedtime as needed for anxiety or sleep. 30 tablet 5   diphenhydrAMINE (BENADRYL) 12.5 MG/5ML liquid Take 37.5 mg by mouth 4 (four) times daily as needed for allergies.     EPINEPHrine 0.3 mg/0.3 mL IJ SOAJ injection Use as directed for severe allergic reaction 2 each 2   estradiol  (ESTRACE) 0.1 MG/GM vaginal cream 1 gram vaginally twice weekly 42.5 g 1   hydrocortisone (ANUSOL-HC) 2.5 % rectal cream Place 1 Application rectally 4 (four) times daily. 30 g 1   losartan-hydrochlorothiazide (HYZAAR) 50-12.5 MG tablet Take 1 tablet by mouth daily. 90 tablet 1   omeprazole (PRILOSEC) 20 MG capsule Take 20 mg by mouth at bedtime.     sodium chloride (OCEAN) 0.65 % SOLN nasal spray Place 1 spray into both nostrils as needed (dryness).     Vibegron (GEMTESA) 75 MG TABS Take 1 tablet (75 mg total) by mouth daily. 42 tablet 0   VITAMIN D PO Take 1 capsule by mouth daily.     No facility-administered medications prior to visit.    Review of Systems;  Patient denies headache, fevers, malaise, unintentional weight loss, skin rash, eye pain, sinus congestion and sinus pain, sore throat, dysphagia,  hemoptysis , cough, dyspnea, wheezing, chest pain, palpitations, orthopnea, edema, abdominal pain, nausea, melena, diarrhea, constipation, flank pain, dysuria, hematuria, urinary  Frequency, nocturia, numbness, tingling, seizures,  Focal weakness, Loss of consciousness,  Tremor, insomnia, depression, anxiety, and suicidal ideation.      Objective:  BP 130/84   Pulse 73   Temp 97.7 F (36.5 C) (Oral)   Ht 5\' 8"  (1.727 m)   Wt 207 lb (93.9 kg)   SpO2 99%   BMI 31.47 kg/m  BP Readings from Last 3 Encounters:  09/02/22 134/80  08/14/22 130/84  07/30/22 128/83    Wt Readings from Last 3 Encounters:  09/02/22 204 lb 12.8 oz (92.9 kg)  09/02/22 207 lb (93.9 kg)  08/14/22 207 lb (93.9 kg)    Physical Exam Vitals reviewed.  Constitutional:      General: She is not in acute distress.    Appearance: Normal appearance. She is normal weight. She is not ill-appearing, toxic-appearing or diaphoretic.  HENT:     Head: Normocephalic.  Eyes:     General: No scleral icterus.       Right eye: No discharge.        Left eye: No discharge.     Conjunctiva/sclera: Conjunctivae normal.   Cardiovascular:     Rate and Rhythm: Normal rate and regular rhythm.     Heart sounds: Normal heart sounds.  Pulmonary:     Effort: Pulmonary effort is normal. No respiratory distress.     Breath sounds: Normal breath sounds.  Musculoskeletal:        General: Normal range of motion.  Skin:    General: Skin is warm and dry.  Neurological:     General: No focal deficit present.     Mental Status: She is alert and oriented to person, place, and time. Mental status is at baseline.  Psychiatric:        Mood and Affect: Mood normal.        Behavior: Behavior normal.        Thought Content: Thought content normal.        Judgment: Judgment normal.    Lab Results  Component Value Date   HGBA1C 6.2 (H) 06/26/2022   HGBA1C 6.3 12/24/2021    Lab Results  Component Value Date   CREATININE 0.88 06/26/2022   CREATININE 0.89 01/02/2022   CREATININE 1.03 12/24/2021    Lab Results  Component Value Date   WBC 7.8 12/24/2021   HGB 12.9 12/24/2021   HCT 38.9 12/24/2021   PLT 333.0 12/24/2021   GLUCOSE 104 (H) 06/26/2022   CHOL 258 (H) 06/26/2022   TRIG 161 (H) 06/26/2022   HDL 59 06/26/2022   LDLDIRECT 162 (H) 06/26/2022   LDLCALC 170 (H) 06/26/2022   ALT 19 06/26/2022   AST 19 06/26/2022   NA 141 06/26/2022   K 4.1 06/26/2022   CL 102 06/26/2022   CREATININE 0.88 06/26/2022   BUN 13 06/26/2022   CO2 24 06/26/2022   TSH 1.41 12/24/2021   INR 1.03 12/10/2012   HGBA1C 6.2 (H) 06/26/2022   MICROALBUR 4.2 (H) 09/19/2020    US RENAL  Result Date: 07/12/2022 CLINICAL DATA:  Recurrent urinary tract infection EXAM: RENAL / URINARY TRACT ULTRASOUND COMPLETE COMPARISON:  CT abdomen pelvis 01/17/2010 FINDINGS: Right Kidney: Renal measurements: 10.4 x 4.9 x 4.2 cm = volume: 112.6 mL. Echogenicity within normal limits. No mass or hydronephrosis visualized. Left Kidney: Renal measurements: 11.5 x 4.5 x 4.7 cm = volume: 128.6 mL. Echogenicity within normal limits. No mass or  hydronephrosis visualized. Bladder: Appears normal for degree of bladder distention. Other: None. IMPRESSION: No hydronephrosis. Electronically Signed   By: Annia Belt M.D.   On: 07/12/2022 15:05    Assessment & Plan:  .Colon cancer screening  Recurrent UTI Assessment & Plan: Cystoscopy negative April 2024.  Estrogen cream prescribed.   Orders: -     Urinalysis, Routine w reflex microscopic -     Urine Culture  Overactive bladder Assessment &  Plan: Improved with gemtesa (urology)   B12 deficiency -     B12 and Folate Panel -     LONG TERM MONITOR (3-14 DAYS); Future  Vitamin D deficiency -     VITAMIN D 25 Hydroxy (Vit-D Deficiency, Fractures)  Dizziness Assessment & Plan: History of ablation for WPW syndrome.  ZIO monitor ordered   Orders: -     LONG TERM MONITOR (3-14 DAYS); Future  OSA (obstructive sleep apnea) -     PSG SLEEP STUDY; Future  Functional dyspepsia -     NM GASTRIC EMPTYING; Future  Other fatigue Assessment & Plan: I suspect the source is her untreated OSA  given lack of serologic evidence of anemia,   underactive thryoid, etc.  Sleep study (home ) ordered    Bloating symptom Assessment & Plan: Rule out gastroparesis      I provided 32 minutes of face-to-face time during this encounter reviewing patient's last visit with me, patient's  most recent visit with cardiology, and immunology,  recent surgical and non surgical procedures, previous  labs and imaging studies, counseling on currently addressed issues,  and post visit ordering to diagnostics and therapeutics .   Follow-up: Return in about 3 months (around 11/14/2022).   Sherlene Shams, MD

## 2022-08-14 NOTE — Assessment & Plan Note (Signed)
Cystoscopy negative April 2024.  Estrogen cream prescribed.

## 2022-08-15 DIAGNOSIS — R42 Dizziness and giddiness: Secondary | ICD-10-CM | POA: Insufficient documentation

## 2022-08-15 DIAGNOSIS — R5383 Other fatigue: Secondary | ICD-10-CM | POA: Insufficient documentation

## 2022-08-15 DIAGNOSIS — R140 Abdominal distension (gaseous): Secondary | ICD-10-CM | POA: Insufficient documentation

## 2022-08-15 LAB — URINALYSIS, ROUTINE W REFLEX MICROSCOPIC
Bilirubin Urine: NEGATIVE
Hgb urine dipstick: NEGATIVE
Ketones, ur: NEGATIVE
Nitrite: NEGATIVE
RBC / HPF: NONE SEEN (ref 0–?)
Specific Gravity, Urine: <=1.005 — AB (ref 1.000–1.030)
Total Protein, Urine: NEGATIVE
Urine Glucose: NEGATIVE
Urobilinogen, UA: 0.2 (ref 0.0–1.0)
pH: 6 (ref 5.0–8.0)

## 2022-08-15 LAB — VITAMIN D 25 HYDROXY (VIT D DEFICIENCY, FRACTURES): VITD: 14.19 ng/mL — ABNORMAL LOW (ref 30.00–100.00)

## 2022-08-15 LAB — URINE CULTURE
MICRO NUMBER:: 14960043
SPECIMEN QUALITY:: ADEQUATE

## 2022-08-15 LAB — B12 AND FOLATE PANEL
Folate: 12 ng/mL (ref >5.9)
Vitamin B-12: 253 pg/mL (ref 211–911)

## 2022-08-15 NOTE — Assessment & Plan Note (Signed)
History of ablation for WPW syndrome.  ZIO monitor ordered

## 2022-08-15 NOTE — Assessment & Plan Note (Signed)
I suspect the source is her untreated OSA  given lack of serologic evidence of anemia,   underactive thryoid, etc.  Sleep study (home ) ordered

## 2022-08-15 NOTE — Assessment & Plan Note (Signed)
Rule out gastroparesis

## 2022-08-16 LAB — HM MAMMOGRAPHY

## 2022-08-17 ENCOUNTER — Other Ambulatory Visit: Payer: Self-pay | Admitting: Internal Medicine

## 2022-08-17 DIAGNOSIS — E559 Vitamin D deficiency, unspecified: Secondary | ICD-10-CM

## 2022-08-17 MED ORDER — ERGOCALCIFEROL 1.25 MG (50000 UT) PO CAPS: 50000.0000 [IU] | ORAL_CAPSULE | ORAL | 3 refills | Status: DC

## 2022-08-17 NOTE — Assessment & Plan Note (Signed)
Recurrent,  continue weekly megadosing

## 2022-08-19 ENCOUNTER — Encounter: Payer: Self-pay | Admitting: Internal Medicine

## 2022-08-21 DIAGNOSIS — E538 Deficiency of other specified B group vitamins: Secondary | ICD-10-CM | POA: Diagnosis not present

## 2022-08-21 DIAGNOSIS — R42 Dizziness and giddiness: Secondary | ICD-10-CM

## 2022-09-02 ENCOUNTER — Ambulatory Visit (INDEPENDENT_AMBULATORY_CARE_PROVIDER_SITE_OTHER): Payer: Medicare Other

## 2022-09-02 ENCOUNTER — Encounter: Payer: Self-pay | Admitting: Internal Medicine

## 2022-09-02 ENCOUNTER — Ambulatory Visit (INDEPENDENT_AMBULATORY_CARE_PROVIDER_SITE_OTHER): Payer: Medicare Other | Admitting: Internal Medicine

## 2022-09-02 VITALS — BP 134/80 | HR 77 | Temp 97.5°F | Ht 68.0 in | Wt 204.8 lb

## 2022-09-02 VITALS — Ht 68.0 in | Wt 207.0 lb

## 2022-09-02 DIAGNOSIS — R0902 Hypoxemia: Secondary | ICD-10-CM | POA: Diagnosis not present

## 2022-09-02 DIAGNOSIS — Z780 Asymptomatic menopausal state: Secondary | ICD-10-CM | POA: Diagnosis not present

## 2022-09-02 DIAGNOSIS — G629 Polyneuropathy, unspecified: Secondary | ICD-10-CM

## 2022-09-02 DIAGNOSIS — R5383 Other fatigue: Secondary | ICD-10-CM

## 2022-09-02 DIAGNOSIS — E538 Deficiency of other specified B group vitamins: Secondary | ICD-10-CM

## 2022-09-02 DIAGNOSIS — R42 Dizziness and giddiness: Secondary | ICD-10-CM

## 2022-09-02 DIAGNOSIS — Z0000 Encounter for general adult medical examination without abnormal findings: Secondary | ICD-10-CM

## 2022-09-02 DIAGNOSIS — J984 Other disorders of lung: Secondary | ICD-10-CM

## 2022-09-02 DIAGNOSIS — I700 Atherosclerosis of aorta: Secondary | ICD-10-CM | POA: Insufficient documentation

## 2022-09-02 DIAGNOSIS — G4733 Obstructive sleep apnea (adult) (pediatric): Secondary | ICD-10-CM

## 2022-09-02 MED ORDER — CYANOCOBALAMIN 1000 MCG/ML IJ SOLN
1000.0000 ug | Freq: Once | INTRAMUSCULAR | Status: AC
Start: 2022-09-02 — End: 2022-09-02
  Administered 2022-09-02: 1000 ug via INTRAMUSCULAR

## 2022-09-02 NOTE — Assessment & Plan Note (Signed)
I suspect the source is  multifactorial and will improve with treatmentof B12 deficiency,   OSA ,  and wahtever pulmonary process is causing her restrictive pattern on recent PFTs

## 2022-09-02 NOTE — Assessment & Plan Note (Signed)
History of ablation for WPW syndrome.  She has been wearing the ZIO monitor  fo rthe last 12 days.   Symptoms may also be  related to B12 deficiency

## 2022-09-02 NOTE — Progress Notes (Addendum)
I connected with  Harmani Kowall on 09/02/22 by a audio enabled telemedicine application and verified that I am speaking with the correct person using two identifiers.  Patient Location: Home  Provider Location: Office/Clinic  I discussed the limitations of evaluation and management by telemedicine. The patient expressed understanding and agreed to proceed.  Subjective:   Anna [name] is a 77 y.o. female who presents for Medicare Annual (Subsequent) preventive examination.  Review of Systems     Cardiac Risk Factors include: advanced age (>66men, >8 women);dyslipidemia;hypertension     Objective:    Today's Vitals   09/02/22 1049  Weight: 207 lb (93.9 kg)  Height: 5\' 8"  (1.727 m)   Body mass index is 31.47 kg/m.     09/02/2022   10:32 AM 09/17/2021   10:16 AM 08/24/2021    9:15 AM 05/22/2021    3:22 PM 12/17/2020   11:27 AM 06/02/2020    4:51 PM 08/12/2017    2:50 PM  Advanced Directives  Does Patient Have a Medical Advance Directive? No Yes Yes Yes No No Yes  Type of Special educational needs teacher of [place];Living will Living will Healthcare Power of [place];Living will   Living will  Does patient want to make changes to medical advance directive?    No - Patient declined     Copy of Healthcare Power of Attorney in Chart?  No - copy requested  No - copy requested     Would patient like information on creating a medical advance directive? No - Patient declined     No - Patient declined     Current Medications (verified) Outpatient Encounter Medications as of 09/02/2022  Medication Sig   acetaminophen (TYLENOL) 500 MG tablet Take 1,000 mg by mouth every 6 (six) hours as needed for moderate pain.   ALPRAZolam (XANAX) 0.5 MG tablet Take 1 tablet (0.5 mg total) by mouth at bedtime as needed for anxiety or sleep.   diphenhydrAMINE (BENADRYL) 12.5 MG/5ML liquid Take 37.5 mg by mouth 4 (four) times daily as needed for allergies.   EPINEPHrine 0.3 mg/0.3 mL IJ SOAJ  injection Use as directed for severe allergic reaction   ergocalciferol (DRISDOL) 1.25 MG (50000 UT) capsule Take 1 capsule (50,000 Units total) by mouth once a week.   estradiol (ESTRACE) 0.1 MG/GM vaginal cream 1 gram vaginally twice weekly   hydrocortisone (ANUSOL-HC) 2.5 % rectal cream Place 1 Application rectally 4 (four) times daily.   losartan-hydrochlorothiazide (HYZAAR) 50-12.5 MG tablet Take 1 tablet by mouth daily.   omeprazole (PRILOSEC) 20 MG capsule Take 20 mg by mouth at bedtime.   sodium chloride (OCEAN) 0.65 % SOLN nasal spray Place 1 spray into both nostrils as needed (dryness).   Vibegron (GEMTESA) 75 MG TABS Take 1 tablet (75 mg total) by mouth daily.   VITAMIN D PO Take 1 capsule by mouth daily.   No facility-administered encounter medications on file as of 09/02/2022.    Allergies (verified) Iodinated contrast media, Alpha-gal, Gluten meal, Latex, Milk-related compounds, Onion, Shellfish allergy, Sulfa antibiotics, Trazodone, Azithromycin, Shellfish-derived products, and Wheat   History: Past Medical History:  Diagnosis Date   Acid reflux    Allergy to alpha-gal    Anxiety    Bilateral cataracts    Cancer (HCC)    Basal Cell Carcinoma, Melanoma   CIN I (cervical intraepithelial neoplasia I)    Colon polyps    benign   H/O squamous cell carcinoma excision    multiple areas  on body per pt   Hemorrhoid    History of basal cell carcinoma (BCC) excision    History of melanoma excision RIGHT LEG  --  2012   history of SVT (supraventricular tachycardia) (HCC) 10/31/2014   resolved   History of Wolff-Parkinson-White (WPW) syndrome    1996  s/p  ablation   Hypertension    IBS (irritable bowel syndrome)    Lyme disease 2005   Mild carotid artery disease (HCC)    per duplex 12-10-2012  bilateral ICA 1-39% stenosis   OsteoArthritis    Sleep apnea    does not use cpap, going back for cpap titration, mild osa per pt   UTI (urinary tract infection)    finished 7  day course on 08-28-2021 all symptoms resolved   Past Surgical History:  Procedure Laterality Date   BREAST SURGERY Left 1999   BREAST MASS EXCISED, benign   CARDIAC ELECTROPHYSIOLOGY STUDY AND ABLATION  1996   SUCCESSFUL ABLATION OF WOLFE-PARKINSON-WHITE SYNDROME  (NO ISSUES SINCE)   CATARACT EXTRACTION, BILATERAL     CERVICAL BIOPSY  W/ LOOP ELECTRODE EXCISION  2005   dr gottsegen   COLONOSCOPY     2019 or 2020   COLPOSCOPY     DILATATION & CURETTAGE/HYSTEROSCOPY WITH MYOSURE N/A 09/17/2021   Procedure: DILATATION & CURETTAGE/HYSTEROSCOPY WITH MYOSURE;  Surgeon: Romualdo Bolk, MD;  Location: [facility];  Service: Gynecology;  Laterality: N/A;   HYSTEROSCOPY WITH D & C  02/13/2012   Procedure: DILATATION AND CURETTAGE /HYSTEROSCOPY;  Surgeon: Trellis Paganini, MD;  Location: [facility];  Service: Gynecology;  Laterality: N/A;  deficit - 710   LAPAROSCOPIC CHOLECYSTECTOMY  05-16-2005  dr Luisa Hart [facility]   melanoma excised     x  3 , 2 on legs and 1 on arm, "not the kind  that spreads"   SVT-CARDIAC ABLATION     TRANSTHORACIC ECHOCARDIOGRAM  12-10-2012   dr Donnie Aho   mild LVH, ef 55-60%/  mild MR/  mild LAE and RAE   TUBAL LIGATION     UPPER GI ENDOSCOPY     VULVECTOMY N/A 07/25/2017   Procedure: WIDE EXCISION VULVECTOMY;  Surgeon: De Blanch, MD;  Location: [facility];  Service: Gynecology;  Laterality: N/A;   Family History  Problem Relation Age of Onset   Cancer Mother        ESOPHAGEAL CANCER   Diabetes Father    Hyperlipidemia Father    Heart disease Father    Heart attack Father    Heart disease Brother    Breast cancer Paternal Aunt        Age 60's   Hyperlipidemia Paternal Grandmother    Heart disease Paternal Grandmother    Stroke Paternal Grandmother    Diabetes Paternal Grandfather    Hyperlipidemia Paternal Grandfather    Heart disease Paternal Grandfather    Stroke Paternal Grandfather    Cancer  Sister        skin   Stroke Maternal Grandmother    Stroke Maternal Grandfather    Diabetes Brother    Social History   Socioeconomic History   Marital status: Divorced    Spouse name: Not on file   Number of children: 2   Years of education: Not on file   Highest education level: Not on file  Occupational History   Not on file  Tobacco Use   Smoking status: Never   Smokeless tobacco: Never  Vaping Use  Vaping Use: Never used  Substance and Sexual Activity   Alcohol use: Not Currently   Drug use: No   Sexual activity: Not Currently    Birth control/protection: Surgical    Comment: 1st intercourse 77 yo--Fewer than 5 partners  Other Topics Concern   Not on file  Social History Narrative   Not on file   Social Determinants of Health   Financial Resource Strain: Low Risk  (09/02/2022)   Overall Financial Resource Strain (CARDIA)    Difficulty of Paying Living Expenses: Not hard at all  Food Insecurity: No Food Insecurity (09/02/2022)   Hunger Vital Sign    Worried About Running Out of Food in the Last Year: Never true    Ran Out of Food in the Last Year: Never true  Transportation Needs: No Transportation Needs (09/02/2022)   PRAPARE - Administrator, Civil Service (Medical): No    Lack of Transportation (Non-Medical): No  Physical Activity: Insufficiently Active (09/02/2022)   Exercise Vital Sign    Days of Exercise per Week: 3 days    Minutes of Exercise per Session: 30 min  Stress: No Stress Concern Present (09/02/2022)   [employer] of Occupational Health - Occupational Stress Questionnaire    Feeling of Stress : Only a little  Social Connections: Moderately Isolated (09/02/2022)   Social Connection and Isolation Panel [NHANES]    Frequency of Communication with Friends and Family: Three times a week    Frequency of Social Gatherings with Friends and Family: Twice a week    Attends Religious Services: More than 4 times per year    Active Member of [employer]  or Organizations: No    Attends Engineer, structural: Never    Marital Status: Divorced    Tobacco Counseling Counseling given: Not Answered   Clinical Intake:  Pre-visit preparation completed: Yes  Pain : No/denies pain     Nutritional Risks: None Diabetes: No  How often do you need to have someone help you when you read instructions, pamphlets, or other written materials from your doctor or pharmacy?: 1 - Never  Diabetic?no  Interpreter Needed?: No  Information entered by :: Kennedy Bucker, LPN   Activities of Daily Living    09/02/2022   10:32 AM 09/17/2021   10:26 AM  In your present state of health, do you have any difficulty performing the following activities:  Hearing? 0 1  Comment  (R) ear more than (L)  Vision? 0 0  Difficulty concentrating or making decisions? 0 0  Walking or climbing stairs? 0 0  Dressing or bathing? 0 0  Doing errands, shopping? 0   Preparing Food and eating ? N   Using the Toilet? N   In the past six months, have you accidently leaked urine? N   Do you have problems with loss of bowel control? N   Managing your Medications? N   Managing your Finances? N   Housekeeping or managing your Housekeeping? N     Patient Care Team: Sherlene Shams, MD as PCP - General (Internal Medicine) Karie Soda, MD as Consulting Physician (General Surgery) Willis Modena, MD as Consulting Physician (Gastroenterology) De Blanch, MD as Attending Physician (Gynecologic Oncology) Dalia Heading, MD as Consulting Physician (Cardiology) Vanna Scotland, MD as Consulting Physician (Urology)  Indicate any recent Medical Services you may have received from other than [facility] providers in the past year (date may be approximate).     Assessment:   This is a  routine wellness examination for Anna [name].  Hearing/Vision screen Hearing Screening - Comments:: No aids Vision Screening - Comments:: Readers- Dr.Bell  Dietary issues and  exercise activities discussed: Current Exercise Habits: Home exercise routine, Type of exercise: walking, Time (Minutes): 30, Frequency (Times/Week): 3, Weekly Exercise (Minutes/Week): 90, Intensity: Mild   Goals Addressed             This Visit's Progress    DIET - EAT MORE FRUITS AND VEGETABLES         Depression Screen    09/02/2022   10:30 AM 08/14/2022    2:13 PM 06/26/2022   10:41 AM 12/24/2021    3:11 PM 08/24/2021    9:12 AM 06/22/2021    3:18 PM 05/11/2021    2:17 PM  PHQ 2/9 Scores  PHQ - 2 Score 0 6 0 1 0 0 0  PHQ- 9 Score 0 16  6       Fall Risk    09/02/2022   10:32 AM 08/14/2022    2:12 PM 06/26/2022   10:41 AM 12/24/2021    2:11 PM 08/24/2021    9:19 AM  Fall Risk   Falls in the past year? 0 0 0 0 0  Number falls in past yr: 0 0 0  0  Injury with Fall? 0 0 0    Risk for fall due to : No Fall Risks No Fall Risks No Fall Risks No Fall Risks   Follow up Falls prevention discussed;Falls evaluation completed Falls evaluation completed Falls evaluation completed Falls evaluation completed Falls evaluation completed    FALL RISK PREVENTION PERTAINING TO THE HOME:  Any stairs in or around the home? No  If so, are there any without handrails? No  Home free of loose throw rugs in walkways, pet beds, electrical cords, etc? Yes  Adequate lighting in your home to reduce risk of falls? Yes   ASSISTIVE DEVICES UTILIZED TO PREVENT FALLS:  Life alert? No  Use of a cane, walker or w/c? No  Grab bars in the bathroom? No  Shower chair or bench in shower? Yes  Elevated toilet seat or a handicapped toilet? No   Cognitive Function:        09/02/2022   10:44 AM  6CIT Screen  What Year? 0 points  What month? 0 points  What time? 0 points  Count back from 20 0 points  Months in reverse 0 points    Immunizations Immunization History  Administered Date(s) Administered   Fluad Quad(high Dose 65+) 12/22/2020, 12/26/2021   Influenza,inj,Quad PF,6+ Mos 03/03/2012    PFIZER(Purple Top)SARS-COV-2 Vaccination 05/07/2019, 05/28/2019, 01/18/2020   Pneumococcal Polysaccharide-23 04/23/2012   Tdap 08/23/2015    TDAP status: Up to date  Flu Vaccine status: Up to date  Pneumococcal vaccine status: Up to date  Covid-19 vaccine status: Completed vaccines  Qualifies for Shingles Vaccine? Yes   Zostavax completed No   Shingrix Completed?: No.    Education has been provided regarding the importance of this vaccine. Patient has been advised to call insurance company to determine out of pocket expense if they have not yet received this vaccine. Advised may also receive vaccine at local pharmacy or Health Dept. Verbalized acceptance and understanding.  Screening Tests Health Maintenance  Topic Date Due   Hepatitis C Screening  Never done   Zoster Vaccines- Shingrix (1 of 2) Never done   Pneumonia Vaccine 44+ Years old (2 of 2 - PCV) 04/23/2013   COVID-19 Vaccine (4 - 2023-24  season) 11/30/2021   Colonoscopy  02/05/2022   INFLUENZA VACCINE  10/31/2022   Medicare Annual Wellness (AWV)  09/02/2023   DTaP/Tdap/Td (2 - Td or Tdap) 08/22/2025   DEXA SCAN  Completed   HPV VACCINES  Aged Out    Health Maintenance  Health Maintenance Due  Topic Date Due   Hepatitis C Screening  Never done   Zoster Vaccines- Shingrix (1 of 2) Never done   Pneumonia Vaccine 67+ Years old (2 of 2 - PCV) 04/23/2013   COVID-19 Vaccine (4 - 2023-24 season) 11/30/2021   Colonoscopy  02/05/2022    Colorectal cancer screening: Type of screening: Colonoscopy. Completed 2021 PER PATIENT. Repeat every 3 years  Mammogram status: Completed 2024 PER PATIENT. Repeat every year  Bone Density status: Ordered 09/02/22. Pt provided with contact info and advised to call to schedule appt.  Lung Cancer Screening: (Low Dose CT Chest recommended if Age 27-80 years, 30 pack-year currently smoking OR have quit w/in 15years.) does not qualify.    Additional Screening:  Hepatitis C Screening: does  not qualify; Completed NO  Vision Screening: Recommended annual ophthalmology exams for early detection of glaucoma and other disorders of the eye. Is the patient up to date with their annual eye exam?  Yes  Who is the provider or what is the name of the office in which the patient attends annual eye exams? Dr.Bell If pt is not established with a provider, would they like to be referred to a provider to establish care? No .   Dental Screening: Recommended annual dental exams for proper oral hygiene  Community Resource Referral / Chronic Care Management: CRR required this visit?  No   CCM required this visit?  No      Plan:     I have personally reviewed and noted the following in the patient's chart:   Medical and social history Use of alcohol, tobacco or illicit drugs  Current medications and supplements including opioid prescriptions. Patient is not currently taking opioid prescriptions. Functional ability and status Nutritional status Physical activity Advanced directives List of other physicians Hospitalizations, surgeries, and ER visits in previous 12 months Vitals Screenings to include cognitive, depression, and falls Referrals and appointments  In addition, I have reviewed and discussed with patient certain preventive protocols, quality metrics, and best practice recommendations. A written personalized care plan for preventive services as well as general preventive health recommendations were provided to patient.     Hal Hope, LPN   04/06/1094   Nurse Notes: none     I have reviewed the above information and agree with above.   Duncan Dull, MD

## 2022-09-02 NOTE — Patient Instructions (Addendum)
Your B12 deficiency may explain MANY OF YOUR SYMPTOMS.   (CERTAINLY THE NUMBNESS!)    I recommend 4 weekly B12 injections,  then continued MONTHLY INJECTIONS THEREAFTER    PULMONOLOGY REFERRAL IN PROCESS TO EVALUATE YOUR LUNGS   PLEASE GO OVER TO [facility] THIS WEEK AND GET A CHEST X RAY    I AM ORDERING CPAP AND SUPPLEMENTAL OXYGEN TO WEAR WHEN SLEEPING AND WHEN EXERCISING
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
## 2022-09-02 NOTE — Patient Instructions (Signed)
Anna [name] , Thank you for taking time to come for your Medicare Wellness Visit. I appreciate your ongoing commitment to your health goals. Please review the following plan we discussed and let me know if I can assist you in the future.   These are the goals we discussed:  Goals       DIET - EAT MORE FRUITS AND VEGETABLES      Increase physical activity (pt-stated)      Use treadmill/stationary bike Weight loss goal 40lb        This is a list of the screening recommended for you and due dates:  Health Maintenance  Topic Date Due   Hepatitis C Screening  Never done   Zoster (Shingles) Vaccine (1 of 2) Never done   Pneumonia Vaccine (2 of 2 - PCV) 04/23/2013   COVID-19 Vaccine (4 - 2023-24 season) 11/30/2021   Colon Cancer Screening  02/05/2022   Flu Shot  10/31/2022   Medicare Annual Wellness Visit  09/02/2023   DTaP/Tdap/Td vaccine (2 - Td or Tdap) 08/22/2025   DEXA scan (bone density measurement)  Completed   HPV Vaccine  Aged Out    Advanced directives: no  Conditions/risks identified: none  Next appointment: Follow up in one year for your annual wellness visit 09/03/23 @ 9:45 am by phone   Preventive Care 65 Years and Older, Female Preventive care refers to lifestyle choices and visits with your health care provider that can promote health and wellness. What does preventive care include? A yearly physical exam. This is also called an annual well check. Dental exams once or twice a year. Routine eye exams. Ask your health care provider how often you should have your eyes checked. Personal lifestyle choices, including: Daily care of your teeth and gums. Regular physical activity. Eating a healthy diet. Avoiding tobacco and drug use. Limiting alcohol use. Practicing safe sex. Taking low-dose aspirin every day. Taking vitamin and mineral supplements as recommended by your health care provider. What happens during an annual well check? The services and screenings done by  your health care provider during your annual well check will depend on your age, overall health, lifestyle risk factors, and family history of disease. Counseling  Your health care provider may ask you questions about your: Alcohol use. Tobacco use. Drug use. Emotional well-being. Home and relationship well-being. Sexual activity. Eating habits. History of falls. Memory and ability to understand (cognition). Work and work Astronomer. Reproductive health. Screening  You may have the following tests or measurements: Height, weight, and BMI. Blood pressure. Lipid and cholesterol levels. These may be checked every 5 years, or more frequently if you are over 32 years old. Skin check. Lung cancer screening. You may have this screening every year starting at age 54 if you have a 30-pack-year history of smoking and currently smoke or have quit within the past 15 years. Fecal occult blood test (FOBT) of the stool. You may have this test every year starting at age 74. Flexible sigmoidoscopy or colonoscopy. You may have a sigmoidoscopy every 5 years or a colonoscopy every 10 years starting at age 77. Hepatitis C blood test. Hepatitis B blood test. Sexually transmitted disease (STD) testing. Diabetes screening. This is done by checking your blood sugar (glucose) after you have not eaten for a while (fasting). You may have this done every 1-3 years. Bone density scan. This is done to screen for osteoporosis. You may have this done starting at age 42. Mammogram. This may be done  every 1-2 years. Talk to your health care provider about how often you should have regular mammograms. Talk with your health care provider about your test results, treatment options, and if necessary, the need for more tests. Vaccines  Your health care provider may recommend certain vaccines, such as: Influenza vaccine. This is recommended every year. Tetanus, diphtheria, and acellular pertussis (Tdap, Td) vaccine. You  may need a Td booster every 10 years. Zoster vaccine. You may need this after age 28. Pneumococcal 13-valent conjugate (PCV13) vaccine. One dose is recommended after age 66. Pneumococcal polysaccharide (PPSV23) vaccine. One dose is recommended after age 37. Talk to your health care provider about which screenings and vaccines you need and how often you need them. This information is not intended to replace advice given to you by your health care provider. Make sure you discuss any questions you have with your health care provider. Document Released: 04/14/2015 Document Revised: 12/06/2015 Document Reviewed: 01/17/2015 Elsevier Interactive Patient Education  2017 [employer].  Fall Prevention in the Home Falls can cause injuries. They can happen to people of all ages. There are many things you can do to make your home safe and to help prevent falls. What can I do on the outside of my home? Regularly fix the edges of walkways and driveways and fix any cracks. Remove anything that might make you trip as you walk through a door, such as a raised step or threshold. Trim any bushes or trees on the path to your home. Use bright outdoor lighting. Clear any walking paths of anything that might make someone trip, such as rocks or tools. Regularly check to see if handrails are loose or broken. Make sure that both sides of any steps have handrails. Any raised decks and porches should have guardrails on the edges. Have any leaves, snow, or ice cleared regularly. Use sand or salt on walking paths during winter. Clean up any spills in your garage right away. This includes oil or grease spills. What can I do in the bathroom? Use night lights. Install grab bars by the toilet and in the tub and shower. Do not use towel bars as grab bars. Use non-skid mats or decals in the tub or shower. If you need to sit down in the shower, use a plastic, non-slip stool. Keep the floor dry. Clean up any water that spills  on the floor as soon as it happens. Remove soap buildup in the tub or shower regularly. Attach bath mats securely with double-sided non-slip rug tape. Do not have throw rugs and other things on the floor that can make you trip. What can I do in the bedroom? Use night lights. Make sure that you have a light by your bed that is easy to reach. Do not use any sheets or blankets that are too big for your bed. They should not hang down onto the floor. Have a firm chair that has side arms. You can use this for support while you get dressed. Do not have throw rugs and other things on the floor that can make you trip. What can I do in the kitchen? Clean up any spills right away. Avoid walking on wet floors. Keep items that you use a lot in easy-to-reach places. If you need to reach something above you, use a strong step stool that has a grab bar. Keep electrical cords out of the way. Do not use floor polish or wax that makes floors slippery. If you must use wax, use  non-skid floor wax. Do not have throw rugs and other things on the floor that can make you trip. What can I do with my stairs? Do not leave any items on the stairs. Make sure that there are handrails on both sides of the stairs and use them. Fix handrails that are broken or loose. Make sure that handrails are as long as the stairways. Check any carpeting to make sure that it is firmly attached to the stairs. Fix any carpet that is loose or worn. Avoid having throw rugs at the top or bottom of the stairs. If you do have throw rugs, attach them to the floor with carpet tape. Make sure that you have a light switch at the top of the stairs and the bottom of the stairs. If you do not have them, ask someone to add them for you. What else can I do to help prevent falls? Wear shoes that: Do not have high heels. Have rubber bottoms. Are comfortable and fit you well. Are closed at the toe. Do not wear sandals. If you use a stepladder: Make  sure that it is fully opened. Do not climb a closed stepladder. Make sure that both sides of the stepladder are locked into place. Ask someone to hold it for you, if possible. Clearly mark and make sure that you can see: Any grab bars or handrails. First and last steps. Where the edge of each step is. Use tools that help you move around (mobility aids) if they are needed. These include: Canes. Walkers. Scooters. Crutches. Turn on the lights when you go into a dark area. Replace any light bulbs as soon as they burn out. Set up your furniture so you have a clear path. Avoid moving your furniture around. If any of your floors are uneven, fix them. If there are any pets around you, be aware of where they are. Review your medicines with your doctor. Some medicines can make you feel dizzy. This can increase your chance of falling. Ask your doctor what other things that you can do to help prevent falls. This information is not intended to replace advice given to you by your health care provider. Make sure you discuss any questions you have with your health care provider. Document Released: 01/12/2009 Document Revised: 08/24/2015 Document Reviewed: 04/22/2014 Elsevier Interactive Patient Education  2017 [employer].

## 2022-09-02 NOTE — Assessment & Plan Note (Signed)
CONFIRMED BY REPEAT STUDY  MAY 2024 WITH 2 NIGHT HOME STUDY.  AHI was 16.4 , with 40 minutes of hypoxia noted ( 02 sat  90%) .  2nd Night:  AHI 14.7 with 30 minutes of hypoxia . CPAP WITH SUPPLEMENTAL OXYGEN ORDERED
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
## 2022-09-05 LAB — PROTEIN ELECTROPHORESIS, SERUM
Albumin ELP: 4 g/dL (ref 3.8–4.8)
Alpha 1: 0.3 g/dL (ref 0.2–0.3)
Alpha 2: 0.9 g/dL (ref 0.5–0.9)
Beta 2: 0.6 g/dL — ABNORMAL HIGH (ref 0.2–0.5)
Beta Globulin: 0.5 g/dL (ref 0.4–0.6)
Gamma Globulin: 0.9 g/dL (ref 0.8–1.7)
Total Protein: 7.2 g/dL (ref 6.1–8.1)

## 2022-09-05 LAB — INTRINSIC FACTOR ANTIBODIES: Intrinsic Factor: POSITIVE — AB

## 2022-09-06 ENCOUNTER — Encounter: Payer: Self-pay | Admitting: Internal Medicine

## 2022-09-06 LAB — IFE AND PE, RANDOM URINE
% BETA, Urine: 0 %
ALBUMIN, U: 100 %
ALPHA 1 URINE: 0 %
ALPHA-2-GLOBULIN, U: 0 %
GAMMA GLOBULIN URINE: 0 %
Protein, Ur: 8.2 mg/dL

## 2022-09-10 ENCOUNTER — Ambulatory Visit
Admission: RE | Admit: 2022-09-10 | Discharge: 2022-09-10 | Disposition: A | Discharge status: Home / Self Care | Payer: Medicare Other | Attending: Internal Medicine | Admitting: Internal Medicine

## 2022-09-10 ENCOUNTER — Ambulatory Visit
Admission: RE | Admit: 2022-09-10 | Discharge: 2022-09-10 | Disposition: A | Discharge status: Home / Self Care | Payer: Medicare Other | Source: Ambulatory Visit | Attending: Internal Medicine | Admitting: Internal Medicine

## 2022-09-10 DIAGNOSIS — R0902 Hypoxemia: Secondary | ICD-10-CM | POA: Insufficient documentation

## 2022-09-10 DIAGNOSIS — J984 Other disorders of lung: Secondary | ICD-10-CM | POA: Diagnosis present

## 2022-09-12 ENCOUNTER — Ambulatory Visit: Payer: Medicare Other

## 2022-09-12 DIAGNOSIS — E538 Deficiency of other specified B group vitamins: Secondary | ICD-10-CM | POA: Diagnosis not present

## 2022-09-12 MED ORDER — CYANOCOBALAMIN 1000 MCG/ML IJ SOLN
1000.0000 ug | Freq: Once | INTRAMUSCULAR | Status: AC
Start: 2022-09-12 — End: 2022-09-12
  Administered 2022-09-12: 1000 ug via INTRAMUSCULAR

## 2022-09-12 NOTE — Progress Notes (Signed)
Patient arrived for a B12 injection and it was administered into her left deltoid. Patient tolerated the injection well and did not show any signs of distress and voice any concerns.

## 2022-09-19 ENCOUNTER — Ambulatory Visit: Payer: BLUE CROSS/BLUE SHIELD

## 2022-09-26 ENCOUNTER — Ambulatory Visit: Payer: Medicare Other | Admitting: *Deleted

## 2022-09-26 DIAGNOSIS — E538 Deficiency of other specified B group vitamins: Secondary | ICD-10-CM | POA: Diagnosis not present

## 2022-09-26 MED ORDER — CYANOCOBALAMIN 1000 MCG/ML IJ SOLN
1000.0000 ug | Freq: Once | INTRAMUSCULAR | Status: AC
Start: 2022-09-26 — End: 2022-09-26
  Administered 2022-09-26: 1000 ug via INTRAMUSCULAR

## 2022-09-26 NOTE — Progress Notes (Signed)
Pt received B12 injection in right deltoid muscle. Pt tolerated it well with no complaints or concerns.  

## 2022-09-27 ENCOUNTER — Ambulatory Visit: Payer: Medicare Other

## 2022-10-08 ENCOUNTER — Ambulatory Visit: Payer: BLUE CROSS/BLUE SHIELD

## 2022-10-10 ENCOUNTER — Ambulatory Visit (INDEPENDENT_AMBULATORY_CARE_PROVIDER_SITE_OTHER): Payer: Medicare Other

## 2022-10-10 DIAGNOSIS — E538 Deficiency of other specified B group vitamins: Secondary | ICD-10-CM

## 2022-10-10 MED ORDER — CYANOCOBALAMIN 1000 MCG/ML IJ SOLN
1000.0000 ug | Freq: Once | INTRAMUSCULAR | Status: AC
Start: 2022-10-10 — End: 2022-10-10
  Administered 2022-10-10: 1000 ug via INTRAMUSCULAR

## 2022-10-10 NOTE — Progress Notes (Signed)
Patient arrived for a B12 injection and it was administered into her left deltoid. Patient tolerated the B12 injection well and did not show any sings of distress or voice any concerns.

## 2022-10-14 ENCOUNTER — Ambulatory Visit: Payer: BLUE CROSS/BLUE SHIELD | Admitting: Internal Medicine

## 2022-10-15 ENCOUNTER — Ambulatory Visit (INDEPENDENT_AMBULATORY_CARE_PROVIDER_SITE_OTHER): Payer: Medicare Other | Admitting: Student in an Organized Health Care Education/Training Program

## 2022-10-15 ENCOUNTER — Telehealth: Payer: Self-pay

## 2022-10-15 ENCOUNTER — Other Ambulatory Visit
Admission: RE | Admit: 2022-10-15 | Discharge: 2022-10-15 | Disposition: A | Discharge status: Home / Self Care | Payer: Medicare Other | Source: Ambulatory Visit | Attending: Student in an Organized Health Care Education/Training Program | Admitting: Student in an Organized Health Care Education/Training Program

## 2022-10-15 ENCOUNTER — Encounter: Payer: Self-pay | Admitting: Student in an Organized Health Care Education/Training Program

## 2022-10-15 ENCOUNTER — Encounter: Payer: Self-pay | Admitting: Internal Medicine

## 2022-10-15 ENCOUNTER — Ambulatory Visit (INDEPENDENT_AMBULATORY_CARE_PROVIDER_SITE_OTHER): Payer: Medicare Other | Admitting: Internal Medicine

## 2022-10-15 VITALS — BP 144/90 | HR 90 | Temp 98.4°F | Ht 68.0 in | Wt 209.4 lb

## 2022-10-15 VITALS — BP 124/70 | HR 76 | Temp 97.8°F | Ht 68.0 in | Wt 209.8 lb

## 2022-10-15 DIAGNOSIS — N952 Postmenopausal atrophic vaginitis: Secondary | ICD-10-CM

## 2022-10-15 DIAGNOSIS — R0602 Shortness of breath: Secondary | ICD-10-CM

## 2022-10-15 DIAGNOSIS — F411 Generalized anxiety disorder: Secondary | ICD-10-CM

## 2022-10-15 DIAGNOSIS — R1319 Other dysphagia: Secondary | ICD-10-CM | POA: Diagnosis not present

## 2022-10-15 DIAGNOSIS — E782 Mixed hyperlipidemia: Secondary | ICD-10-CM

## 2022-10-15 DIAGNOSIS — R140 Abdominal distension (gaseous): Secondary | ICD-10-CM

## 2022-10-15 DIAGNOSIS — I10 Essential (primary) hypertension: Secondary | ICD-10-CM

## 2022-10-15 DIAGNOSIS — G4733 Obstructive sleep apnea (adult) (pediatric): Secondary | ICD-10-CM

## 2022-10-15 DIAGNOSIS — E538 Deficiency of other specified B group vitamins: Secondary | ICD-10-CM

## 2022-10-15 DIAGNOSIS — F410 Panic disorder [episodic paroxysmal anxiety] without agoraphobia: Secondary | ICD-10-CM

## 2022-10-15 DIAGNOSIS — I700 Atherosclerosis of aorta: Secondary | ICD-10-CM

## 2022-10-15 DIAGNOSIS — R0902 Hypoxemia: Secondary | ICD-10-CM

## 2022-10-15 LAB — CBC WITH DIFFERENTIAL/PLATELET
Abs Immature Granulocytes: 0.02 10*3/uL (ref 0.00–0.07)
Basophils Absolute: 0.1 10*3/uL (ref 0.0–0.1)
Basophils Relative: 1 %
Eosinophils Absolute: 0.2 10*3/uL (ref 0.0–0.5)
Eosinophils Relative: 3 %
HCT: 35.6 % — ABNORMAL LOW (ref 36.0–46.0)
Hemoglobin: 12 g/dL (ref 12.0–15.0)
Immature Granulocytes: 0 %
Lymphocytes Relative: 36 %
Lymphs Abs: 3.2 10*3/uL (ref 0.7–4.0)
MCH: 30.3 pg (ref 26.0–34.0)
MCHC: 33.7 g/dL (ref 30.0–36.0)
MCV: 89.9 fL (ref 80.0–100.0)
Monocytes Absolute: 0.7 10*3/uL (ref 0.1–1.0)
Monocytes Relative: 8 %
Neutro Abs: 4.6 10*3/uL (ref 1.7–7.7)
Neutrophils Relative %: 52 %
Platelets: 316 10*3/uL (ref 150–400)
RBC: 3.96 MIL/uL (ref 3.87–5.11)
RDW: 13.6 % (ref 11.5–15.5)
WBC: 8.8 10*3/uL (ref 4.0–10.5)
nRBC: 0 % (ref 0.0–0.2)

## 2022-10-15 LAB — COMPREHENSIVE METABOLIC PANEL
ALT: 15 U/L (ref 0–35)
AST: 14 U/L (ref 0–37)
Albumin: 4.1 g/dL (ref 3.5–5.2)
Alkaline Phosphatase: 83 U/L (ref 39–117)
BUN: 14 mg/dL (ref 6–23)
CO2: 30 mEq/L (ref 19–32)
Calcium: 9.6 mg/dL (ref 8.4–10.5)
Chloride: 103 mEq/L (ref 96–112)
Creatinine, Ser: 0.83 mg/dL (ref 0.40–1.20)
GFR: 68.83 mL/min (ref >60.00)
Glucose, Bld: 106 mg/dL — ABNORMAL HIGH (ref 70–99)
Potassium: 3.8 mEq/L (ref 3.5–5.1)
Sodium: 138 mEq/L (ref 135–145)
Total Bilirubin: 0.4 mg/dL (ref 0.2–1.2)
Total Protein: 7.5 g/dL (ref 6.0–8.3)

## 2022-10-15 LAB — CK: Total CK: 62 U/L (ref 7–177)

## 2022-10-15 MED ORDER — LOSARTAN POTASSIUM-HCTZ 50-12.5 MG PO TABS: 1.0000 | ORAL_TABLET | Freq: Every day | ORAL | 1 refills | Status: DC

## 2022-10-15 MED ORDER — HYDROCORTISONE (PERIANAL) 2.5 % EX CREA: 1.0000 | TOPICAL_CREAM | Freq: Four times a day (QID) | CUTANEOUS | 1 refills | Status: DC

## 2022-10-15 MED ORDER — ESTRADIOL 0.1 MG/GM VA CREA
TOPICAL_CREAM | VAGINAL | 1 refills | Status: DC
Start: 2022-10-15 — End: 2023-06-03

## 2022-10-15 MED ORDER — CYANOCOBALAMIN 1000 MCG/ML IJ SOLN
1000.0000 ug | Freq: Once | INTRAMUSCULAR | Status: AC
Start: 2022-10-15 — End: 2022-10-15
  Administered 2022-10-15: 1000 ug via INTRAMUSCULAR

## 2022-10-15 MED ORDER — ROSUVASTATIN CALCIUM 20 MG PO TABS: 20.0000 mg | ORAL_TABLET | ORAL | 0 refills | Status: DC

## 2022-10-15 NOTE — Telephone Encounter (Signed)
noted 

## 2022-10-15 NOTE — Patient Instructions (Addendum)
Your 4th weekly b12 injection was today  Schedule monthly b12 injections starting in August   Please start generic Crestor EVERY OTHER DAY to prevent stroke and heart attacks.  Return in 3 weeks for labs  A modified swallow evaluation has been ordered .  This does not involve contrast or chemicals

## 2022-10-15 NOTE — Telephone Encounter (Signed)
LMTCB. Need to schedule pt a non fasting lab appt in 3 weeks.

## 2022-10-15 NOTE — Progress Notes (Signed)
Synopsis: Referred in for shortness of breath by Sherlene Shams, MD  Assessment & Plan:   1. Shortness of breath  Presenting for the evaluation of shortness of breath that is mostly exertional with associated dizziness.  Patient was worked up by her primary care physician and had a monitor for 14 days that did not show any arrhythmias.  Echocardiogram performed 1 year ago was within normal and did not show any valvulopathy.  CT scan in 2022 appears to be normal on my review.  History and exam do not point in any one direction, and given she's had a cardiac workup, my differential currently includes both obstructive and restrictive pulmonary disorders (ILD, obesity) as well as anemia. I will obtain a pulmonary function test with spirometry, lung volumes, and DLCO to assess the nature of the patient's dyspnea and lung function.  I will also obtain high-resolution chest CT to rule out any parenchymal lung disease. Finally, I will order a CBC to rule out anemia as a contributor to her dyspnea.  - Pulmonary Function Test [facility] Only; Future - CT CHEST HIGH RESOLUTION; Future - CBC with Differential/Platelet; Future   Return in about 2 months (around 12/16/2022).  I spent 60 minutes caring for this patient today, including preparing to see the patient, obtaining a medical history , reviewing a separately obtained history, performing a medically appropriate examination and/or evaluation, counseling and educating the patient/family/caregiver, ordering medications, tests, or procedures, documenting clinical information in the electronic health record, and independently interpreting results (not separately reported/billed) and communicating results to the patient/family/caregiver  Raechel Chute, MD [facility] 10/15/2022 2:34 PM    End of visit medications:  No orders of the defined types were placed in this encounter.    Current Outpatient Medications:    acetaminophen  (TYLENOL) 500 MG tablet, Take 1,000 mg by mouth every 6 (six) hours as needed for moderate pain., Disp: , Rfl:    ALPRAZolam (XANAX) 0.5 MG tablet, Take 1 tablet (0.5 mg total) by mouth at bedtime as needed for anxiety or sleep., Disp: 30 tablet, Rfl: 5   diphenhydrAMINE (BENADRYL) 12.5 MG/5ML liquid, Take 37.5 mg by mouth 4 (four) times daily as needed for allergies., Disp: , Rfl:    EPINEPHrine 0.3 mg/0.3 mL IJ SOAJ injection, Use as directed for severe allergic reaction, Disp: 2 each, Rfl: 2   ergocalciferol (DRISDOL) 1.25 MG (50000 UT) capsule, Take 1 capsule (50,000 Units total) by mouth once a week., Disp: 12 capsule, Rfl: 3   estradiol (ESTRACE) 0.1 MG/GM vaginal cream, 1 gram vaginally twice weekly, Disp: 42.5 g, Rfl: 1   hydrocortisone (ANUSOL-HC) 2.5 % rectal cream, Place 1 Application rectally 4 (four) times daily., Disp: 30 g, Rfl: 1   losartan-hydrochlorothiazide (HYZAAR) 50-12.5 MG tablet, Take 1 tablet by mouth daily., Disp: 90 tablet, Rfl: 1   omeprazole (PRILOSEC) 20 MG capsule, Take 20 mg by mouth at bedtime., Disp: , Rfl:    rosuvastatin (CRESTOR) 20 MG tablet, Take 1 tablet (20 mg total) by mouth every other day., Disp: 45 tablet, Rfl: 0   sodium chloride (OCEAN) 0.65 % SOLN nasal spray, Place 1 spray into both nostrils as needed (dryness)., Disp: , Rfl:    Vibegron (GEMTESA) 75 MG TABS, Take 1 tablet (75 mg total) by mouth daily., Disp: 42 tablet, Rfl: 0   Subjective:   PATIENT ID: Anna [name] GENDER: female DOB: [birth year], MRN: [id number]  Chief Complaint  Patient presents with   pulmonary consult  SOB with exertion and prod cough with small amount of clear sputum.     HPI  Patient is a pleasant 77 year old female presenting to clinic for the evaluation of shortness of breath.  Patient feels that she first started noticing symptoms around a little over a year ago while on vacation with her son in [place], [place].  She was unable to keep up with  activities and had to take multiple breaks to rest.  Over the past year, the symptoms have somewhat worsened and are associated with feeling of dizziness.  She feels she is unable to walk the same distances that she used to in the past.  She was previously very active and would play tennis 4 times a week.  Today, she walked from the parking lot and had to take multiple breaks to get over to our clinic.  She also has difficulty going up a flight of stairs and has to stop halfway to catch her breath.  She also experiences dizziness around that time as well. 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  The symptoms are not worse when she lays flat or when she bends over.  She does not wake up from sleep with said symptoms.  She does have an occasional cough that she thinks is secondary to her reflux disease.  She does report 35 pounds of weight gain over the past few years. She does not have any chest tightness, chest pain, wheezing, sputum production, night sweats, fevers, or chills.  Patient has a past medical history of [employer] which was diagnosed and ablated in the 90s.  She also has a history of hypertension, sleep apnea, and dysphagia.  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  She is a never smoker and denies any occupational exposures.  She does report having been through houses that have mold but denies any mold in her own house.  She had a dog that passed away in November 05, 2018.  She does not currently have any pets and denies ever having any birds.  Ancillary information including prior medications, full medical/surgical/family/social histories, and PFTs (when available) are listed below and have been reviewed.   Review of Systems  Constitutional:  Negative for chills, fever and weight loss (weight gain reported).  Respiratory:  Positive for shortness of breath. Negative for wheezing.   Cardiovascular:  Negative for chest pain and  palpitations.  Neurological:  Positive for dizziness and weakness.     Objective:   Vitals:   10/15/22 1359  BP: (!) 144/90  Pulse: 90  Temp: 98.4 F (36.9 C)  TempSrc: Temporal  SpO2: 95%  Weight: 209 lb 6.4 oz (95 kg)  Height: 5\' 8"  (1.727 m)   95% on RA BMI Readings from Last 3 Encounters:  10/15/22 31.84 kg/m  10/15/22 31.90 kg/m  09/02/22 31.14 kg/m   Wt Readings from Last 3 Encounters:  10/15/22 209 lb 6.4 oz (95 kg)  10/15/22 209 lb 12.8 oz (95.2 kg)  09/02/22 204 lb 12.8 oz (92.9 kg)    Physical Exam Constitutional:      Appearance: Normal appearance. She is obese.  HENT:     Head: Normocephalic.     Mouth/Throat:     Mouth: Mucous membranes are moist.  Eyes:     Extraocular Movements: Extraocular movements intact.  Cardiovascular:     Rate and Rhythm: Normal rate and regular rhythm.     Pulses: Normal  pulses.     Heart sounds: Normal heart sounds.  Pulmonary:     Effort: Pulmonary effort is normal. No respiratory distress.     Breath sounds: Normal breath sounds. No wheezing or rales.  Abdominal:     General: Abdomen is flat.     Palpations: Abdomen is soft.  Musculoskeletal:     Right lower leg: No edema.     Left lower leg: No edema.  Neurological:     General: No focal deficit present.     Mental Status: She is alert and oriented to person, place, and time. Mental status is at baseline.     Ancillary Information    Past Medical History:  Diagnosis Date   Acid reflux    Allergy to alpha-gal    Anxiety    Bilateral cataracts    Cancer (HCC)    Basal Cell Carcinoma, Melanoma   CIN I (cervical intraepithelial neoplasia I)    Colon polyps    benign   H/O squamous cell carcinoma excision    multiple areas  on body per pt   Hemorrhoid    History of basal cell carcinoma (BCC) excision    History of melanoma excision RIGHT LEG  --  2012   history of SVT (supraventricular tachycardia) (HCC) 10/31/2014   resolved   History of  Wolff-Parkinson-White (WPW) syndrome    1996  s/p  ablation   Hypertension    IBS (irritable bowel syndrome)    Lyme disease 2005   Mild carotid artery disease (HCC)    per duplex 12-10-2012  bilateral ICA 1-39% stenosis   OsteoArthritis    Sleep apnea    does not use cpap, going back for cpap titration, mild osa per pt   UTI (urinary tract infection)    finished 7 day course on 08-28-2021 all symptoms resolved     Family History  Problem Relation Age of Onset   Cancer Mother        ESOPHAGEAL CANCER   Diabetes Father    Hyperlipidemia Father    Heart disease Father    Heart attack Father    Heart disease Brother    Breast cancer Paternal Aunt        Age 86's   Hyperlipidemia Paternal Grandmother    Heart disease Paternal Grandmother    Stroke Paternal Grandmother    Diabetes Paternal Grandfather    Hyperlipidemia Paternal Grandfather    Heart disease Paternal Grandfather    Stroke Paternal Grandfather    Cancer Sister        skin   Stroke Maternal Grandmother    Stroke Maternal Grandfather    Diabetes Brother      Past Surgical History:  Procedure Laterality Date   BREAST SURGERY Left 1999   BREAST MASS EXCISED, benign   CARDIAC ELECTROPHYSIOLOGY STUDY AND ABLATION  1996   SUCCESSFUL ABLATION OF WOLFE-PARKINSON-WHITE SYNDROME  (NO ISSUES SINCE)   CATARACT EXTRACTION, BILATERAL     CERVICAL BIOPSY  W/ LOOP ELECTRODE EXCISION  2005   dr gottsegen   COLONOSCOPY     2019 or 2020   COLPOSCOPY     DILATATION & CURETTAGE/HYSTEROSCOPY WITH MYOSURE N/A 09/17/2021   Procedure: DILATATION & CURETTAGE/HYSTEROSCOPY WITH MYOSURE;  Surgeon: Romualdo Bolk, MD;  Location: [facility];  Service: Gynecology;  Laterality: N/A;   HYSTEROSCOPY WITH D & C  02/13/2012   Procedure: DILATATION AND CURETTAGE /HYSTEROSCOPY;  Surgeon: Trellis Paganini, MD;  Location: [facility];  Service: Gynecology;  Laterality: N/A;  deficit - 710   LAPAROSCOPIC  CHOLECYSTECTOMY  05-16-2005  dr [facility]   melanoma excised     x  3 , 2 on legs and 1 on arm, "not the kind  that spreads"   SVT-CARDIAC ABLATION     TRANSTHORACIC ECHOCARDIOGRAM  12-10-2012   dr Donnie Aho   mild LVH, ef 55-60%/  mild MR/  mild LAE and RAE   TUBAL LIGATION     UPPER GI ENDOSCOPY     VULVECTOMY N/A 07/25/2017   Procedure: WIDE EXCISION VULVECTOMY;  Surgeon: De Blanch, MD;  Location: [facility];  Service: Gynecology;  Laterality: N/A;    Social History   Socioeconomic History   Marital status: Divorced    Spouse name: Not on file   Number of children: 2   Years of education: Not on file   Highest education level: Not on file  Occupational History   Not on file  Tobacco Use   Smoking status: Never   Smokeless tobacco: Never  Vaping Use   Vaping status: Never Used  Substance and Sexual Activity   Alcohol use: Not Currently   Drug use: No   Sexual activity: Not Currently    Birth control/protection: Surgical    Comment: 1st intercourse 77 yo--Fewer than 5 partners  Other Topics Concern   Not on file  Social History Narrative   Not on file   Social Determinants of Health   Financial Resource Strain: Low Risk  (09/02/2022)   Overall Financial Resource Strain (CARDIA)    Difficulty of Paying Living Expenses: Not hard at all  Food Insecurity: No Food Insecurity (09/02/2022)   Hunger Vital Sign    Worried About Running Out of Food in the Last Year: Never true    Ran Out of Food in the Last Year: Never true  Transportation Needs: No Transportation Needs (09/02/2022)   PRAPARE - Administrator, Civil Service (Medical): No    Lack of Transportation (Non-Medical): No  Physical Activity: Insufficiently Active (09/02/2022)   Exercise Vital Sign    Days of Exercise per Week: 3 days    Minutes of Exercise per Session: 30 min  Stress: No Stress Concern Present (09/02/2022)   [employer] of Occupational Health - Occupational  Stress Questionnaire    Feeling of Stress : Only a little  Social Connections: Moderately Isolated (09/02/2022)   Social Connection and Isolation Panel [NHANES]    Frequency of Communication with Friends and Family: Three times a week    Frequency of Social Gatherings with Friends and Family: Twice a week    Attends Religious Services: More than 4 times per year    Active Member of [employer] or Organizations: No    Attends Banker Meetings: Never    Marital Status: Divorced  Catering manager Violence: Not At Risk (09/02/2022)   Humiliation, Afraid, Rape, and Kick questionnaire    Fear of Current or Ex-Partner: No    Emotionally Abused: No    Physically Abused: No    Sexually Abused: No     Allergies  Allergen Reactions   Iodinated Contrast Media Hypertension    Patient experienced elevated heart rate, hypertension and had to be taken to the ED   Alpha-Gal     facial redness, upset stomach   Gluten Meal Dermatitis    Bloating    Latex Other (See Comments)   Milk-Related Compounds     facial  flushing,  upset stomach, diarrhea   Onion     Other reaction(s): Not available   Shellfish Allergy Hives   Sulfa Antibiotics Hives, Nausea Only and Other (See Comments)   Trazodone     Severe sleepiness   Azithromycin Diarrhea, Palpitations and Other (See Comments)    Cramping    Shellfish-Derived Products     Other Reaction(s): abdominal pain   Wheat Rash    Stomach pain     CBC    Component Value Date/Time   WBC 7.8 12/24/2021 1458   RBC 4.09 12/24/2021 1458   HGB 12.9 12/24/2021 1458   HGB 14.5 01/27/2012 1716   HCT 38.9 12/24/2021 1458   HCT 37.0 06/19/2020 1557   PLT 333.0 12/24/2021 1458   PLT 303 01/27/2012 1716   MCV 95.0 12/24/2021 1458   MCV 96 01/27/2012 1716   MCH 32.3 05/22/2021 1525   MCHC 33.2 12/24/2021 1458   RDW 13.9 12/24/2021 1458   RDW 13.3 01/27/2012 1716   LYMPHSABS 2.4 12/24/2021 1458   MONOABS 0.6 12/24/2021 1458   EOSABS 0.1 12/24/2021  1458   BASOSABS 0.1 12/24/2021 1458    Pulmonary Functions Testing Results:     No data to display          Outpatient Medications Prior to Visit  Medication Sig Dispense Refill   acetaminophen (TYLENOL) 500 MG tablet Take 1,000 mg by mouth every 6 (six) hours as needed for moderate pain.     ALPRAZolam (XANAX) 0.5 MG tablet Take 1 tablet (0.5 mg total) by mouth at bedtime as needed for anxiety or sleep. 30 tablet 5   diphenhydrAMINE (BENADRYL) 12.5 MG/5ML liquid Take 37.5 mg by mouth 4 (four) times daily as needed for allergies.     EPINEPHrine 0.3 mg/0.3 mL IJ SOAJ injection Use as directed for severe allergic reaction 2 each 2   ergocalciferol (DRISDOL) 1.25 MG (50000 UT) capsule Take 1 capsule (50,000 Units total) by mouth once a week. 12 capsule 3   estradiol (ESTRACE) 0.1 MG/GM vaginal cream 1 gram vaginally twice weekly 42.5 g 1   hydrocortisone (ANUSOL-HC) 2.5 % rectal cream Place 1 Application rectally 4 (four) times daily. 30 g 1   losartan-hydrochlorothiazide (HYZAAR) 50-12.5 MG tablet Take 1 tablet by mouth daily. 90 tablet 1   omeprazole (PRILOSEC) 20 MG capsule Take 20 mg by mouth at bedtime.     rosuvastatin (CRESTOR) 20 MG tablet Take 1 tablet (20 mg total) by mouth every other day. 45 tablet 0   sodium chloride (OCEAN) 0.65 % SOLN nasal spray Place 1 spray into both nostrils as needed (dryness).     Vibegron (GEMTESA) 75 MG TABS Take 1 tablet (75 mg total) by mouth daily. 42 tablet 0   No facility-administered medications prior to visit.
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
## 2022-10-15 NOTE — Telephone Encounter (Signed)
Patient returned office phone call and labs scheduled.

## 2022-10-15 NOTE — Telephone Encounter (Signed)
At check-out today, check-out note states patient needs lab appointment in three weeks.  I do not see lab orders in the system, so we need to know if these are fasting or non-fasting labs.  I spoke with Sandy Salaam, CMA, and she states she will follow-up with Dr. Duncan Dull and patient.

## 2022-10-16 DIAGNOSIS — R1310 Dysphagia, unspecified: Secondary | ICD-10-CM | POA: Insufficient documentation

## 2022-10-16 DIAGNOSIS — F410 Panic disorder [episodic paroxysmal anxiety] without agoraphobia: Secondary | ICD-10-CM | POA: Insufficient documentation
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
## 2022-10-16 NOTE — Assessment & Plan Note (Signed)
Well controlled on current regimen of losartan/hct at 50/12.5 mg dose. Lytes and  Renal function are normal,  no changes today.  Lab Results  Component Value Date   CREATININE 0.83 10/15/2022   Lab Results  Component Value Date   NA 138 10/15/2022   K 3.8 10/15/2022   CL 103 10/15/2022   CO2 30 10/15/2022

## 2022-10-16 NOTE — Assessment & Plan Note (Signed)
She was referred for a gastric emptying study to rule out gastroparesis but has deferred due to concern about side effects of the isotope.  Reassurance given

## 2022-10-16 NOTE — Assessment & Plan Note (Signed)
4th weekly injection was done today.  Begin monthly injections for maintenance

## 2022-10-16 NOTE — Assessment & Plan Note (Signed)
Etiology unclear ; ddx includes achalasia,  stricture and ass  modified barium swallow ordered

## 2022-10-16 NOTE — Assessment & Plan Note (Signed)
CONFIRMED BY REPEAT STUDY  MAY 2024 WITH 2 NIGHT HOME STUDY.  AHI was 16.4 , with 40 minutes of hypoxia noted ( 02 sat  90%) .  2nd Night:  AHI 14.7 with 30 minutes of hypoxia . CPAP WITH SUPPLEMENTAL OXYGEN ORDERED . She did not tolerate the first mask due to mouth breathing.  She is waiting for the 2nd mask

## 2022-10-16 NOTE — Assessment & Plan Note (Signed)
Reviewed prior attempts to treat with SSRI, which were not tolerated.  Counselled patient  and offered newere SSRI therapy; she declines

## 2022-10-16 NOTE — Assessment & Plan Note (Signed)
Etiology unclear.  Hypoxemia  to 87% on room air was documented  on 6 minute walk done during home sleep study which  improved to 91% with 2 LPM.  PFTS were also done and  noted restrictive physiology (FEV1/FVC 188% of predicted.   Pulmonology referral in progress

## 2022-10-17 ENCOUNTER — Ambulatory Visit
Admission: RE | Admit: 2022-10-17 | Discharge: 2022-10-17 | Disposition: A | Discharge status: Home / Self Care | Payer: Medicare Other | Source: Ambulatory Visit | Attending: Student in an Organized Health Care Education/Training Program | Admitting: Student in an Organized Health Care Education/Training Program

## 2022-10-17 DIAGNOSIS — R0602 Shortness of breath: Secondary | ICD-10-CM | POA: Diagnosis present

## 2022-10-30 ENCOUNTER — Encounter (INDEPENDENT_AMBULATORY_CARE_PROVIDER_SITE_OTHER): Payer: Self-pay

## 2022-11-04 ENCOUNTER — Encounter: Payer: Self-pay | Admitting: Student in an Organized Health Care Education/Training Program

## 2022-11-04 NOTE — Telephone Encounter (Signed)
PCP ordered the CPAP.

## 2022-11-05 ENCOUNTER — Other Ambulatory Visit: Payer: Self-pay

## 2022-11-05 ENCOUNTER — Emergency Department: Payer: Medicare Other

## 2022-11-05 ENCOUNTER — Emergency Department
Admission: EM | Admit: 2022-11-05 | Discharge: 2022-11-05 | Disposition: A | Discharge status: Home / Self Care | Payer: Medicare Other | Attending: Emergency Medicine | Admitting: Emergency Medicine

## 2022-11-05 ENCOUNTER — Other Ambulatory Visit (INDEPENDENT_AMBULATORY_CARE_PROVIDER_SITE_OTHER): Payer: Medicare Other

## 2022-11-05 DIAGNOSIS — R42 Dizziness and giddiness: Secondary | ICD-10-CM

## 2022-11-05 DIAGNOSIS — E782 Mixed hyperlipidemia: Secondary | ICD-10-CM

## 2022-11-05 DIAGNOSIS — I10 Essential (primary) hypertension: Secondary | ICD-10-CM | POA: Diagnosis not present

## 2022-11-05 LAB — BASIC METABOLIC PANEL
Anion gap: 10 (ref 5–15)
BUN: 17 mg/dL (ref 8–23)
CO2: 24 mmol/L (ref 22–32)
Calcium: 9.1 mg/dL (ref 8.9–10.3)
Chloride: 105 mmol/L (ref 98–111)
Creatinine, Ser: 1.07 mg/dL — ABNORMAL HIGH (ref 0.44–1.00)
GFR, Estimated: 54 mL/min — ABNORMAL LOW (ref >60)
Glucose, Bld: 99 mg/dL (ref 70–99)
Potassium: 3.3 mmol/L — ABNORMAL LOW (ref 3.5–5.1)
Sodium: 139 mmol/L (ref 135–145)

## 2022-11-05 LAB — CBC
HCT: 37.3 % (ref 36.0–46.0)
Hemoglobin: 12.4 g/dL (ref 12.0–15.0)
MCH: 31.1 pg (ref 26.0–34.0)
MCHC: 33.2 g/dL (ref 30.0–36.0)
MCV: 93.5 fL (ref 80.0–100.0)
Platelets: 339 10*3/uL (ref 150–400)
RBC: 3.99 MIL/uL (ref 3.87–5.11)
RDW: 14.1 % (ref 11.5–15.5)
WBC: 8 10*3/uL (ref 4.0–10.5)
nRBC: 0 % (ref 0.0–0.2)

## 2022-11-05 LAB — COMPREHENSIVE METABOLIC PANEL
ALT: 18 U/L (ref 0–35)
AST: 17 U/L (ref 0–37)
Albumin: 4.1 g/dL (ref 3.5–5.2)
Alkaline Phosphatase: 71 U/L (ref 39–117)
BUN: 14 mg/dL (ref 6–23)
CO2: 29 mEq/L (ref 19–32)
Calcium: 9.7 mg/dL (ref 8.4–10.5)
Chloride: 104 mEq/L (ref 96–112)
Creatinine, Ser: 1.01 mg/dL (ref 0.40–1.20)
GFR: 54.36 mL/min — ABNORMAL LOW (ref >60.00)
Glucose, Bld: 109 mg/dL — ABNORMAL HIGH (ref 70–99)
Potassium: 3.8 mEq/L (ref 3.5–5.1)
Sodium: 141 mEq/L (ref 135–145)
Total Bilirubin: 0.3 mg/dL (ref 0.2–1.2)
Total Protein: 7.3 g/dL (ref 6.0–8.3)

## 2022-11-05 LAB — TROPONIN I (HIGH SENSITIVITY): Troponin I (High Sensitivity): 7 ng/L (ref <18)

## 2022-11-05 NOTE — ED Triage Notes (Signed)
Pt presents to ER with c/o HTN that has been ongoing for appx 2 weeks.  Pt states she takes losartan at home, and her PCP has been increasing med dosage, but states she checked her BP today, and states it was 209/90.  Pt reports feeling like she is having some tunnel vision, slight sob, and left side neck pain that started PTA.  Pt is otherwise A&O x4 and in NAD in triage.

## 2022-11-05 NOTE — ED Provider Notes (Signed)
[facility] Provider Note    Event Date/Time   First MD Initiated Contact with Patient 11/05/22 2042     (approximate)   History   Chief Complaint Hypertension   HPI  Anna [name] is a 77 y.o. female with past medical history of hypertension, Wolff-Parkinson-White status post ablation, anxiety, and arthritis who presents to the ED complaining of elevated blood pressure.  Patient reports that earlier today she started feeling dizzy with some "tunnel vision", generalized weakness, and malaise.  She went to check her blood pressure and got multiple readings with the systolic greater than 200.  She states she has been compliant with her blood pressure medications, denies any associated chest pain or shortness of breath.  She has not had any vision changes, speech changes, focal numbness or weakness.     Physical Exam   Triage Vital Signs: ED Triage Vitals [11/05/22 1935]  Encounter Vitals Group     BP (!) 206/74     Systolic BP Percentile      Diastolic BP Percentile      Pulse Rate 91     Resp 20     Temp 98.4 F (36.9 C)     Temp Source Oral     SpO2 97 %     Weight      Height      Head Circumference      Peak Flow      Pain Score 3     Pain Loc      Pain Education      Exclude from Growth Chart     Most recent vital signs: Vitals:   11/05/22 1935  BP: (!) 206/74  Pulse: 91  Resp: 20  Temp: 98.4 F (36.9 C)  SpO2: 97%    Constitutional: Alert and oriented. Eyes: Conjunctivae are normal. Head: Atraumatic. Nose: No congestion/rhinnorhea. Mouth/Throat: Mucous membranes are moist.  Cardiovascular: Normal rate, regular rhythm. Grossly normal heart sounds.  2+ radial pulses bilaterally. Respiratory: Normal respiratory effort.  No retractions. Lungs CTAB. Gastrointestinal: Soft and nontender. No distention. Musculoskeletal: No lower extremity tenderness nor edema.  Neurologic:  Normal speech and language. No gross focal neurologic  deficits are appreciated.    ED Results / Procedures / Treatments   Labs (all labs ordered are listed, but only abnormal results are displayed) Labs Reviewed  BASIC METABOLIC PANEL - Abnormal; Notable for the following components:      Result Value   Potassium 3.3 (*)    Creatinine, Ser 1.07 (*)    GFR, Estimated 54 (*)    All other components within normal limits  CBC  TROPONIN I (HIGH SENSITIVITY)     EKG  ED ECG REPORT I, Chesley Noon, the attending physician, personally viewed and interpreted this ECG.   Date: 11/05/2022  EKG Time: 19:40  Rate: 89  Rhythm: normal sinus rhythm  Axis: Normal  Intervals:right bundle branch block  ST&T Change: Inferior T wave inversions, similar to previous  RADIOLOGY CT head reviewed and interpreted by me with no hemorrhage or midline shift.  PROCEDURES:  Critical Care performed: No  Procedures   MEDICATIONS ORDERED IN ED: Medications - No data to display   IMPRESSION / MDM / ASSESSMENT AND PLAN / ED COURSE  I reviewed the triage vital signs and the nursing notes.  77 y.o. female with past medical history of hypertension, Wolff-Parkinson-White status post ablation, anxiety, and arthritis who presents to the ED complaining of dizziness and elevated blood pressure at home starting earlier today.  Patient's presentation is most consistent with acute presentation with potential threat to life or bodily function.  Differential diagnosis includes, but is not limited to, stroke, TIA, ACS, anemia, electrolyte abnormality, AKI, arrhythmia, uncontrolled hypertension.  Patient nontoxic-appearing and in no acute distress, vital signs remarkable for elevated blood pressure but otherwise reassuring.  At the time of my assessment, patient states that her symptoms have resolved and she has a nonfocal neurologic exam.  CT head is negative for acute process, chest x-ray also unremarkable.  EKG shows no evidence  of arrhythmia or ischemia and labs are reassuring with troponin within normal limits, no significant anemia, electrolyte abnormality, or AKI.  Blood pressure improved on recheck with systolic in the 160s, given no hypertensive emergency patient is appropriate for outpatient management.  She was counseled to follow-up with her PCP for recheck of blood pressure and return to the ED for new or worsening symptoms.  Patient agrees with plan.      FINAL CLINICAL IMPRESSION(S) / ED DIAGNOSES   Final diagnoses:  Uncontrolled hypertension  Dizziness     Rx / DC Orders   ED Discharge Orders     None        Note:  This document was prepared using Dragon voice recognition software and may include unintentional dictation errors.   Chesley Noon, MD 11/05/22 2059

## 2022-11-06 ENCOUNTER — Telehealth: Payer: Self-pay | Admitting: Internal Medicine

## 2022-11-06 DIAGNOSIS — I10 Essential (primary) hypertension: Secondary | ICD-10-CM

## 2022-11-06 MED ORDER — LOSARTAN POTASSIUM-HCTZ 50-12.5 MG PO TABS: 2.0000 | ORAL_TABLET | Freq: Every day | ORAL | Status: DC

## 2022-11-06 NOTE — Assessment & Plan Note (Signed)
Given recent ER visit for systolic 210 with "tunnel vision" will increase dose of losartan hct to 100/25 .   RN visit one week

## 2022-11-06 NOTE — Telephone Encounter (Signed)
Pt is aware and gave a verbal understanding.  

## 2022-11-06 NOTE — Telephone Encounter (Signed)
Pt would like to be called concerning her BP

## 2022-11-06 NOTE — Telephone Encounter (Signed)
Spoke with pt and she stated that she went to the ED last night because her bp was elevated to 209/90. Pt was having symptoms of tunnel vision, left side neck pain and severe HA at the time. Pt was discharged from hospital and advised to follow up with PCP. Pt is scheduled for 11/18/2022, no sooner appts. BP this morning is 155/88 no symptoms.

## 2022-11-11 ENCOUNTER — Telehealth: Payer: Self-pay

## 2022-11-11 NOTE — Telephone Encounter (Signed)
FYI

## 2022-11-11 NOTE — Telephone Encounter (Signed)
I called patient today to reschedule her blood pressure check with our nurse from tomorrow to Friday (11/15/2022).  Patient states she would like for Dr. Duncan Dull to know that she had a GI visit today and her blood pressure was 125/75 at her visit.  Patient states her GI had a cancellation and she will be able to have her endoscopy and colonoscopy done on 11/13/2022.  Patient states she already has an appointment scheduled to meet with Dr. Darrick Huntsman on 11/18/2022, so she will hold off on having her blood pressure check with our nurse.

## 2022-11-12 ENCOUNTER — Ambulatory Visit: Payer: BLUE CROSS/BLUE SHIELD

## 2022-11-12 ENCOUNTER — Telehealth: Payer: Self-pay

## 2022-11-12 NOTE — Telephone Encounter (Signed)
Transition Care Management Unsuccessful Follow-up Telephone Call  Date of discharge and from where:  [place] 8/6  Attempts:  1st Attempt  Reason for unsuccessful TCM follow-up call:  No answer/busy   Lenard Forth [facility] Terrell Guide, [facility] [phone] [address] [place], [place], [place] [address] Phone: [phone] Email: [email or web address]

## 2022-11-13 ENCOUNTER — Telehealth: Payer: Self-pay

## 2022-11-13 LAB — HM COLONOSCOPY

## 2022-11-13 NOTE — Telephone Encounter (Signed)
Transition Care Management Follow-up Telephone Call Date of discharge and from where: [place] 8/6 How have you been since you were released from the hospital? Pretty good having a colonoscopy today Any questions or concerns? No  Items Reviewed: Did the pt receive and understand the discharge instructions provided? Yes  Medications obtained and verified? No  MEDICATIONS WERE GIVEN Other? No  Any new allergies since your discharge? No  Dietary orders reviewed? No Do you have support at home? Yes    Follow up appointments reviewed:  PCP Hospital f/u appt confirmed? Yes  Scheduled to see PCP on 8/19 @ . Specialist Hospital f/u appt confirmed? No  Scheduled to see  on  @ . Are transportation arrangements needed? No  If their condition worsens, is the pt aware to call PCP or go to the Emergency Dept.? Yes Was the patient provided with contact information for the PCP's office or ED? Yes Was to pt encouraged to call back with questions or concerns? Yes

## 2022-11-14 NOTE — Telephone Encounter (Signed)
Spoke to the patient. She's still having some of these issues with the CPAP. She's also been seen by GI and had endoscopy. She is going to hold the CPAP given her AHI was only 14.7 with mild sleep apnea, get her PFT's, and see me for follow up at which point we can discuss re-trial of CPAP.

## 2022-11-18 ENCOUNTER — Ambulatory Visit (INDEPENDENT_AMBULATORY_CARE_PROVIDER_SITE_OTHER): Payer: Medicare Other | Admitting: Internal Medicine

## 2022-11-18 VITALS — BP 122/70 | HR 77 | Temp 98.0°F | Ht 68.0 in | Wt 203.4 lb

## 2022-11-18 DIAGNOSIS — K643 Fourth degree hemorrhoids: Secondary | ICD-10-CM

## 2022-11-18 DIAGNOSIS — Z1211 Encounter for screening for malignant neoplasm of colon: Secondary | ICD-10-CM

## 2022-11-18 DIAGNOSIS — E538 Deficiency of other specified B group vitamins: Secondary | ICD-10-CM | POA: Diagnosis not present

## 2022-11-18 DIAGNOSIS — R140 Abdominal distension (gaseous): Secondary | ICD-10-CM

## 2022-11-18 DIAGNOSIS — R002 Palpitations: Secondary | ICD-10-CM

## 2022-11-18 DIAGNOSIS — G4733 Obstructive sleep apnea (adult) (pediatric): Secondary | ICD-10-CM | POA: Diagnosis not present

## 2022-11-18 DIAGNOSIS — R1319 Other dysphagia: Secondary | ICD-10-CM

## 2022-11-18 MED ORDER — CYANOCOBALAMIN 1000 MCG/ML IJ SOLN
1000.0000 ug | Freq: Once | INTRAMUSCULAR | Status: AC
Start: 2022-11-18 — End: 2022-11-18
  Administered 2022-11-18: 1000 ug via INTRAMUSCULAR

## 2022-11-18 MED ORDER — LOSARTAN POTASSIUM-HCTZ 50-12.5 MG PO TABS: 1.0000 | ORAL_TABLET | Freq: Every day | ORAL | Status: DC

## 2022-11-18 NOTE — Patient Instructions (Addendum)
I agree with NO MORE CPAP. Sleeping on the wedge may provide a treatment for both the reflux and the snoring /apnea   You can try using famoitidine ( generic for Pepcid)   dose is   20 mg once or twice daily if  acid reflux symptoms recur   Eosinophilic esophagitis is a biopsy diagnosis.  Follow up with Dr Dulce Sellar to discuss the results and the treatment    MAKE YOURSELF START USING YOUR EXERCISE MACHINE.  EVEN IF YO START WITH 5 MINUTES !  "JUST DO IT"

## 2022-11-18 NOTE — Progress Notes (Unsigned)
Subjective:  Patient ID: Anna [name], female    DOB: 30-Sep-1945  Age: 77 y.o. MRN: [id number]  CC: The primary encounter diagnosis was Colon cancer screening. Diagnoses of Heart palpitations, B12 deficiency, Prolapsed internal hemorrhoids, grade 4, OSA (obstructive sleep apnea), Esophageal dysphagia, and Bloating symptom were also pertinent to this visit.   HPIFYI Chief Complaint  Patient presents with   Medical Management of Chronic Issues    CPAP compliance   Seen one month ago.   1) OSA diagnosed by sleep study in May.  She has given the CPAP another try and simply CANNOT TOLERATE CPAP  DUE TO  recurrence of throat  pain after 15 minutes   2) HTN :  reviewed ER visit in mid August for HTN systolic pressure of  206   .  CT head was done to rule out CVA .  BP  improved to 160 without interventions.  BP has been normal  since the ER visit WITHOUT doubling her dose of losartan as instructed;  bp normal today   3) ODYNOPHAGIA/DYSPHAGIA:  EGD done by Dr Dulce Sellar at [facility] last week.  .  Report reviewed:  changes suggestive of EE (biopsies pending) . He agreed with stopping omeprazole due to allergic reaction she had (blisters in mouth  which have resolved(.  Using Tums   4) Hematochezia:  colonoscopy was done at same time  Flat polyps removed,  path report pending   Internal hemorrhoids noted and were presumed to be the  source of bleeding .  Not pursuiing banding  5) B12 deficiency :due for injections   Outpatient Medications Prior to Visit  Medication Sig Dispense Refill   acetaminophen (TYLENOL) 500 MG tablet Take 1,000 mg by mouth every 6 (six) hours as needed for moderate pain.     ALPRAZolam (XANAX) 0.5 MG tablet Take 1 tablet (0.5 mg total) by mouth at bedtime as needed for anxiety or sleep. 30 tablet 5   diphenhydrAMINE (BENADRYL) 12.5 MG/5ML liquid Take 37.5 mg by mouth 4 (four) times daily as needed for allergies.     EPINEPHrine 0.3 mg/0.3 mL IJ SOAJ injection Use as  directed for severe allergic reaction 2 each 2   ergocalciferol (DRISDOL) 1.25 MG (50000 UT) capsule Take 1 capsule (50,000 Units total) by mouth once a week. 12 capsule 3   estradiol (ESTRACE) 0.1 MG/GM vaginal cream 1 gram vaginally twice weekly 42.5 g 1   hydrocortisone (ANUSOL-HC) 2.5 % rectal cream Place 1 Application rectally 4 (four) times daily. 30 g 1   rosuvastatin (CRESTOR) 20 MG tablet Take 1 tablet (20 mg total) by mouth every other day. 45 tablet 0   sodium chloride (OCEAN) 0.65 % SOLN nasal spray Place 1 spray into both nostrils as needed (dryness).     losartan-hydrochlorothiazide (HYZAAR) 50-12.5 MG tablet Take 2 tablets by mouth daily.     Vibegron (GEMTESA) 75 MG TABS Take 1 tablet (75 mg total) by mouth daily. (Patient not taking: Reported on 11/18/2022) 42 tablet 0   omeprazole (PRILOSEC) 20 MG capsule Take 20 mg by mouth at bedtime. (Patient not taking: Reported on 11/18/2022)     No facility-administered medications prior to visit.    Review of Systems;  Patient denies headache, fevers, malaise, unintentional weight loss, skin rash, eye pain, sinus congestion and sinus pain, sore throat, dysphagia,  hemoptysis , cough, dyspnea, wheezing, chest pain, palpitations, orthopnea, edema, abdominal pain, nausea, melena, diarrhea, constipation, flank pain, dysuria, hematuria, urinary  Frequency, nocturia,  numbness, tingling, seizures,  Focal weakness, Loss of consciousness,  Tremor, insomnia, depression, anxiety, and suicidal ideation.      Objective:  BP 122/70   Pulse 77   Temp 98 F (36.7 C) (Oral)   Ht 5\' 8"  (1.727 m)   Wt 203 lb 6.4 oz (92.3 kg)   SpO2 97%   BMI 30.93 kg/m   BP Readings from Last 3 Encounters:  11/18/22 122/70  11/05/22 (!) 206/74  10/15/22 (!) 144/90    Wt Readings from Last 3 Encounters:  11/18/22 203 lb 6.4 oz (92.3 kg)  10/15/22 209 lb 6.4 oz (95 kg)  10/15/22 209 lb 12.8 oz (95.2 kg)    Physical Exam Vitals reviewed.  Constitutional:       General: She is not in acute distress.    Appearance: She is obese. She is not ill-appearing, toxic-appearing or diaphoretic.  HENT:     Head: Normocephalic.  Eyes:     General: No scleral icterus.       Right eye: No discharge.        Left eye: No discharge.     Conjunctiva/sclera: Conjunctivae normal.  Cardiovascular:     Rate and Rhythm: Normal rate and regular rhythm.     Heart sounds: Normal heart sounds.  Pulmonary:     Effort: Pulmonary effort is normal. No respiratory distress.     Breath sounds: Normal breath sounds.  Musculoskeletal:        General: Normal range of motion.  Skin:    General: Skin is warm and dry.  Neurological:     General: No focal deficit present.     Mental Status: She is alert and oriented to person, place, and time. Mental status is at baseline.  Psychiatric:        Mood and Affect: Mood normal.        Behavior: Behavior normal.        Thought Content: Thought content normal.        Judgment: Judgment normal.     Lab Results  Component Value Date   HGBA1C 6.2 (H) 06/26/2022   HGBA1C 6.3 12/24/2021    Lab Results  Component Value Date   CREATININE 1.07 (H) 11/05/2022   CREATININE 1.01 11/05/2022   CREATININE 0.83 10/15/2022    Lab Results  Component Value Date   WBC 8.0 11/05/2022   HGB 12.4 11/05/2022   HCT 37.3 11/05/2022   PLT 339 11/05/2022   GLUCOSE 99 11/05/2022   CHOL 258 (H) 06/26/2022   TRIG 161 (H) 06/26/2022   HDL 59 06/26/2022   LDLDIRECT 162 (H) 06/26/2022   LDLCALC 170 (H) 06/26/2022   ALT 18 11/05/2022   AST 17 11/05/2022   NA 139 11/05/2022   K 3.3 (L) 11/05/2022   CL 105 11/05/2022   CREATININE 1.07 (H) 11/05/2022   BUN 17 11/05/2022   CO2 24 11/05/2022   TSH 1.41 12/24/2021   INR 1.03 12/10/2012   HGBA1C 6.2 (H) 06/26/2022   MICROALBUR 4.2 (H) 09/19/2020    DG Chest 1 View  Result Date: 11/05/2022 CLINICAL DATA:  Hypertension with mild shortness of breath. EXAM: CHEST  1 VIEW COMPARISON:  October 10, 2022 FINDINGS: The cardiac silhouette is borderline in size. Both lungs are clear. The visualized skeletal structures are unremarkable. IMPRESSION: Borderline cardiomegaly without acute or active cardiopulmonary disease. Electronically Signed   By: Aram Candela M.D.   On: 11/05/2022 20:05   CT HEAD WO CONTRAST ( )  Result  Date: 11/05/2022 CLINICAL DATA:  Headache and elevated blood pressure. EXAM: CT HEAD WITHOUT CONTRAST TECHNIQUE: Contiguous axial images were obtained from the base of the skull through the vertex without intravenous contrast. RADIATION DOSE REDUCTION: This exam was performed according to the departmental dose-optimization program which includes automated exposure control, adjustment of the mA and/or kV according to patient size and/or use of iterative reconstruction technique. COMPARISON:  June 02, 2020 FINDINGS: Brain: There is mild cerebral atrophy with widening of the extra-axial spaces and ventricular dilatation. There are areas of decreased attenuation within the white matter tracts of the supratentorial brain, consistent with microvascular disease changes. Vascular: No hyperdense vessel or unexpected calcification. Skull: A stable, chronic benign-appearing 7 mm diameter lytic focus is seen within the posterior parietal region of the skull on the left. Sinuses/Orbits: No acute finding. Other: None. IMPRESSION: 1. Generalized cerebral atrophy with mild chronic white matter small vessel ischemic changes. 2. No acute intracranial abnormality. Electronically Signed   By: Aram Candela M.D.   On: 11/05/2022 20:04    Assessment & Plan:  .Colon cancer screening  Heart palpitations Assessment & Plan: None recently    B12 deficiency -     Cyanocobalamin  Prolapsed internal hemorrhoids, grade 4 Assessment & Plan: She has declined banding or other procedures    OSA (obstructive sleep apnea) Assessment & Plan: CONFIRMED BY REPEAT STUDY  MAY 2024 WITH 2 NIGHT HOME  STUDY.  AHI was 16.4 , with 40 minutes of hypoxia noted ( 02 sat  90%) .  2nd Night:  AHI 14.7 with 30 minutes of hypoxia . CPAP WITH SUPPLEMENTAL OXYGEN was ordered  but not in use  due to patent intolerance (throat pain after 15 minutes of CAP use .  She has tried 2 different style masks.  No further trials  per patient request.  He has adopted the practice of sleeping on a wedge     Esophageal dysphagia Assessment & Plan: EGD report reviewed:  no stricture found.  Changes suggestive of EE,  biopsies pending    Bloating symptom Assessment & Plan: Etiology liely food related.  Workup incomplete; however,  She was referred for a gastric emptying study to rule out gastroparesis but has deferred due to concern about side effects of the isotope.  Pelvic u/s in Dec 2022 noted atrophic ovaries, and she has IBS/constipation, so ovarian CA considered but unlikely    Other orders -     Losartan Potassium-HCTZ; Take 1 tablet by mouth daily.     I provided 30 minutes of face-to-face time during this encounter reviewing patient's last visit with me, patient's  most recent visit with gastroenterology,   recent surgical and non surgical procedures, previous  labs and imaging studies, counseling on currently addressed issues,  and post visit ordering to diagnostics and therapeutics .   Follow-up: Return in about 6 months (around 05/21/2023).   Sherlene Shams, MD

## 2022-11-18 NOTE — Assessment & Plan Note (Signed)
None recently 

## 2022-11-19 ENCOUNTER — Encounter: Payer: Self-pay | Admitting: Internal Medicine

## 2022-11-19 NOTE — Assessment & Plan Note (Signed)
EGD report reviewed:  no stricture found.  Changes suggestive of EE,  biopsies pending

## 2022-11-19 NOTE — Assessment & Plan Note (Signed)
She has declined banding or other procedures

## 2022-11-19 NOTE — Assessment & Plan Note (Signed)
CONFIRMED BY REPEAT STUDY  MAY 2024 WITH 2 NIGHT HOME STUDY.  AHI was 16.4 , with 40 minutes of hypoxia noted ( 02 sat  90%) .  2nd Night:  AHI 14.7 with 30 minutes of hypoxia . CPAP WITH SUPPLEMENTAL OXYGEN was ordered  but not in use  due to patent intolerance (throat pain after 15 minutes of CAP use .  She has tried 2 different style masks.  No further trials  per patient request.  He has adopted the practice of sleeping on a wedge

## 2022-11-19 NOTE — Telephone Encounter (Signed)
Pt called in stating that needs to discontinued her C-pap machine and for that DR. Tullo needs to fax over a statement to [employer] @[phone]. Any questions or concern, she's available [phone].

## 2022-11-19 NOTE — Assessment & Plan Note (Addendum)
Etiology liely food related.  Workup incomplete; however,  She was referred for a gastric emptying study to rule out gastroparesis but has deferred due to concern about side effects of the isotope.  Pelvic u/s in Dec 2022 noted atrophic ovaries, and she has IBS/constipation, so ovarian CA considered but unlikely

## 2022-11-21 NOTE — Telephone Encounter (Signed)
Letter has been completed and faxed to [employer].

## 2022-12-16 ENCOUNTER — Ambulatory Visit: Payer: BLUE CROSS/BLUE SHIELD

## 2022-12-17 ENCOUNTER — Ambulatory Visit (INDEPENDENT_AMBULATORY_CARE_PROVIDER_SITE_OTHER): Payer: Medicare Other

## 2022-12-17 ENCOUNTER — Ambulatory Visit: Payer: Medicare Other | Attending: Student in an Organized Health Care Education/Training Program

## 2022-12-17 DIAGNOSIS — Z23 Encounter for immunization: Secondary | ICD-10-CM | POA: Diagnosis not present

## 2022-12-17 DIAGNOSIS — R0602 Shortness of breath: Secondary | ICD-10-CM | POA: Insufficient documentation

## 2022-12-17 DIAGNOSIS — E538 Deficiency of other specified B group vitamins: Secondary | ICD-10-CM

## 2022-12-17 MED ORDER — CYANOCOBALAMIN 1000 MCG/ML IJ SOLN
1000.0000 ug | Freq: Once | INTRAMUSCULAR | Status: AC
Start: 2022-12-17 — End: 2022-12-17
  Administered 2022-12-17: 1000 ug via INTRAMUSCULAR

## 2022-12-17 MED ORDER — ALBUTEROL SULFATE (2.5 MG/3ML) 0.083% IN NEBU
2.5000 mg | INHALATION_SOLUTION | Freq: Once | RESPIRATORY_TRACT | Status: AC
  Administered 2022-12-17: 2.5 mg via RESPIRATORY_TRACT
  Filled 2022-12-17: qty 3

## 2022-12-17 NOTE — Progress Notes (Signed)
Patient presented for B 12 injection to left deltoid, patient voiced no concerns nor showed any signs of distress during injection   Patient presented for Influenza High Dose injection to right deltoid, patient voiced no concerns nor showed any signs of distress during injection

## 2022-12-18 ENCOUNTER — Ambulatory Visit (INDEPENDENT_AMBULATORY_CARE_PROVIDER_SITE_OTHER): Payer: Medicare Other | Admitting: Student in an Organized Health Care Education/Training Program

## 2022-12-18 ENCOUNTER — Encounter: Payer: Self-pay | Admitting: Student in an Organized Health Care Education/Training Program

## 2022-12-18 VITALS — BP 118/78 | HR 81 | Temp 97.8°F | Ht 68.0 in | Wt 202.8 lb

## 2022-12-18 DIAGNOSIS — R0602 Shortness of breath: Secondary | ICD-10-CM

## 2022-12-18 MED ORDER — ALBUTEROL SULFATE HFA 108 (90 BASE) MCG/ACT IN AERS
1.0000 | INHALATION_SPRAY | RESPIRATORY_TRACT | 2 refills | Status: AC | PRN
Start: 2022-12-18 — End: ?

## 2022-12-18 NOTE — Progress Notes (Signed)
Synopsis: Referred in for shortness of breath by Sherlene Shams, MD  Assessment & Plan:   #Shortness of breath   Presenting for the evaluation of shortness of breath that is mostly exertional with associated dizziness.  Patient was worked up by her primary care physician and had a monitor for 14 days that did not show any arrhythmias.  Echocardiogram performed 1 year ago was within normal and did not show any valvulopathy.  CT scan in 2022 appears to be normal on my review. HRCT ordered following our last visit shows airtrapping that could indicate small airway disease. There were also very minimal patchy basilar predominant subpleural ground glass opacity and reticulation that could be associated with NSIP, though appearance favors bland postinfectious/postinflammatory scarring. PFT's performed yesterday show very minimal restriction and drop in DLCO. This is mildly below the lower limit of normal and it is unclear if this is related to her symptoms. I suspect obesity is contributing to the patient's symptoms.  Given possibility of small airway disease based on the high resolution CT, I will prescribe the patient an inhaler to be used as needed with exertion. I will also see her for follow up in 6 months at which point we will repeat PFT's. I will also plan on repeating the HRCT at the 1 year mark to ensure stability in the CT findings.  - Albuterol 1-2 puffs every 4 to 6 hours as needed    Return in about 4 months (around 04/19/2023).  I spent 30 minutes caring for this patient today, including preparing to see the patient, obtaining a medical history , reviewing a separately obtained history, performing a medically appropriate examination and/or evaluation, counseling and educating the patient/family/caregiver, ordering medications, tests, or procedures, and documenting clinical information in the electronic health record  Raechel Chute, MD [facility] 12/18/2022 3:32 PM     End of visit medications:  Meds ordered this encounter  Medications   albuterol (VENTOLIN HFA) 108 (90 Base) MCG/ACT inhaler    Sig: Inhale 1-2 puffs into the lungs every 4 (four) hours as needed for wheezing or shortness of breath.    Dispense:  8 g    Refill:  2     Current Outpatient Medications:    acetaminophen (TYLENOL) 500 MG tablet, Take 1,000 mg by mouth every 6 (six) hours as needed for moderate pain., Disp: , Rfl:    albuterol (VENTOLIN HFA) 108 (90 Base) MCG/ACT inhaler, Inhale 1-2 puffs into the lungs every 4 (four) hours as needed for wheezing or shortness of breath., Disp: 8 g, Rfl: 2   ALPRAZolam (XANAX) 0.5 MG tablet, Take 1 tablet (0.5 mg total) by mouth at bedtime as needed for anxiety or sleep., Disp: 30 tablet, Rfl: 5   diphenhydrAMINE (BENADRYL) 12.5 MG/5ML liquid, Take 37.5 mg by mouth 4 (four) times daily as needed for allergies., Disp: , Rfl:    EPINEPHrine 0.3 mg/0.3 mL IJ SOAJ injection, Use as directed for severe allergic reaction, Disp: 2 each, Rfl: 2   ergocalciferol (DRISDOL) 1.25 MG (50000 UT) capsule, Take 1 capsule (50,000 Units total) by mouth once a week., Disp: 12 capsule, Rfl: 3   estradiol (ESTRACE) 0.1 MG/GM vaginal cream, 1 gram vaginally twice weekly, Disp: 42.5 g, Rfl: 1   hydrocortisone (ANUSOL-HC) 2.5 % rectal cream, Place 1 Application rectally 4 (four) times daily., Disp: 30 g, Rfl: 1   losartan-hydrochlorothiazide (HYZAAR) 50-12.5 MG tablet, Take 1 tablet by mouth daily., Disp: , Rfl:  pantoprazole (PROTONIX) 40 MG tablet, Take 40 mg by mouth daily., Disp: , Rfl:    rosuvastatin (CRESTOR) 20 MG tablet, Take 1 tablet (20 mg total) by mouth every other day., Disp: 45 tablet, Rfl: 0   sodium chloride (OCEAN) 0.65 % SOLN nasal spray, Place 1 spray into both nostrils as needed (dryness)., Disp: , Rfl:    Vibegron (GEMTESA) 75 MG TABS, Take 1 tablet (75 mg total) by mouth daily. (Patient not taking: Reported on 11/18/2022), Disp: 42 tablet,  Rfl: 0   Subjective:   PATIENT ID: Anna [name] GENDER: female DOB: 08/03/1945, MRN: [id number]  Chief Complaint  Patient presents with   Follow-up    DOE. Little wheezing. Cough.    HPI  Patient is a pleasant 77 year old female presenting to clinic for the evaluation of shortness of breath.   Patient feels that she first started noticing symptoms around a little over a year ago while on vacation with her son in [place], [place].  She was unable to keep up with activities and had to take multiple breaks to rest.  Over the past year, the symptoms have somewhat worsened and are associated with feeling of dizziness.  She feels she is unable to walk the same distances that she used to in the past.  She was previously very active and would play tennis 4 times a week.  Today, she walked from the parking lot and had to take multiple breaks to get over to our clinic.  She also has difficulty going up a flight of stairs and has to stop halfway to catch her breath.  She also experiences dizziness around that time as well. 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  The symptoms are not worse when she lays flat or when she bends over.  She does not wake up from sleep with said symptoms.  She does have an occasional cough that she thinks is secondary to her reflux disease.  She does report 35 pounds of weight gain over the past few years. She does not have any chest tightness, chest pain, wheezing, sputum production, night sweats, fevers, or chills.   Patient has a past medical history of [employer] which was diagnosed and ablated in the 90s.  She also has a history of hypertension, sleep apnea, and dysphagia.   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  She is a never smoker and denies any occupational exposures.  She does report having been through houses that have mold but denies any mold in her own house.  She had a dog that  passed away in 2019-02-19.  She does not currently have any pets and denies ever having any birds.    Ancillary information including prior medications, full medical/surgical/family/social histories, and PFTs (when available) are listed below and have been reviewed.   Review of Systems  Constitutional:  Negative for chills, fever and weight loss (weight gain reported).  Respiratory:  Positive for shortness of breath. Negative for wheezing.   Cardiovascular:  Negative for chest pain and palpitations.  Neurological:  Positive for dizziness and weakness.  All other systems reviewed and are negative.    Objective:   Vitals:   12/18/22 1519  BP: 118/78  Pulse: 81  Temp: 97.8 F (36.6 C)  SpO2: 96%  Weight: 202 lb 12.8 oz (92 kg)  Height: 5\' 8"  (1.727 m)   96% on RA BMI Readings from Last  3 Encounters:  12/18/22 30.84 kg/m  11/18/22 30.93 kg/m  10/15/22 31.84 kg/m   Wt Readings from Last 3 Encounters:  12/18/22 202 lb 12.8 oz (92 kg)  11/18/22 203 lb 6.4 oz (92.3 kg)  10/15/22 209 lb 6.4 oz (95 kg)    Physical Exam Constitutional:      Appearance: Normal appearance. She is obese.  HENT:     Head: Normocephalic.     Mouth/Throat:     Mouth: Mucous membranes are moist.  Eyes:     Extraocular Movements: Extraocular movements intact.  Cardiovascular:     Rate and Rhythm: Normal rate and regular rhythm.     Pulses: Normal pulses.     Heart sounds: Normal heart sounds.  Pulmonary:     Effort: Pulmonary effort is normal. No respiratory distress.     Breath sounds: Normal breath sounds. No wheezing or rales.  Abdominal:     General: Abdomen is flat.     Palpations: Abdomen is soft.  Musculoskeletal:     Right lower leg: No edema.     Left lower leg: No edema.  Neurological:     General: No focal deficit present.     Mental Status: She is alert and oriented to person, place, and time. Mental status is at baseline.       Ancillary Information    Past Medical  History:  Diagnosis Date   Acid reflux    Allergy to alpha-gal    Anxiety    Bilateral cataracts    Cancer (HCC)    Basal Cell Carcinoma, Melanoma   CIN I (cervical intraepithelial neoplasia I)    Colon polyps    benign   H/O squamous cell carcinoma excision    multiple areas  on body per pt   Hemorrhoid    History of basal cell carcinoma (BCC) excision    History of melanoma excision RIGHT LEG  --  2012   history of SVT (supraventricular tachycardia) (HCC) 10/31/2014   resolved   History of Wolff-Parkinson-White (WPW) syndrome    1996  s/p  ablation   Hypertension    IBS (irritable bowel syndrome)    Lyme disease 2005   Mild carotid artery disease (HCC)    per duplex 12-10-2012  bilateral ICA 1-39% stenosis   OsteoArthritis    Sleep apnea    does not use cpap, going back for cpap titration, mild osa per pt   UTI (urinary tract infection)    finished 7 day course on 08-28-2021 all symptoms resolved     Family History  Problem Relation Age of Onset   Cancer Mother        ESOPHAGEAL CANCER   Diabetes Father    Hyperlipidemia Father    Heart disease Father    Heart attack Father    Heart disease Brother    Breast cancer Paternal Aunt        Age 44's   Hyperlipidemia Paternal Grandmother    Heart disease Paternal Grandmother    Stroke Paternal Grandmother    Diabetes Paternal Grandfather    Hyperlipidemia Paternal Grandfather    Heart disease Paternal Grandfather    Stroke Paternal Grandfather    Cancer Sister        skin   Stroke Maternal Grandmother    Stroke Maternal Grandfather    Diabetes Brother      Past Surgical History:  Procedure Laterality Date   BREAST SURGERY Left 1999   BREAST MASS EXCISED, benign  CARDIAC ELECTROPHYSIOLOGY STUDY AND ABLATION  1996   SUCCESSFUL ABLATION OF WOLFE-PARKINSON-WHITE SYNDROME  (NO ISSUES SINCE)   CATARACT EXTRACTION, BILATERAL     CERVICAL BIOPSY  W/ LOOP ELECTRODE EXCISION  2005   dr gottsegen   COLONOSCOPY      2019 or 2020   COLPOSCOPY     DILATATION & CURETTAGE/HYSTEROSCOPY WITH MYOSURE N/A 09/17/2021   Procedure: DILATATION & CURETTAGE/HYSTEROSCOPY WITH MYOSURE;  Surgeon: Romualdo Bolk, MD;  Location: [facility];  Service: Gynecology;  Laterality: N/A;   HYSTEROSCOPY WITH D & C  02/13/2012   Procedure: DILATATION AND CURETTAGE /HYSTEROSCOPY;  Surgeon: Trellis Paganini, MD;  Location: [facility];  Service: Gynecology;  Laterality: N/A;  deficit - 710   LAPAROSCOPIC CHOLECYSTECTOMY  05-16-2005  dr [facility]   melanoma excised     x  3 , 2 on legs and 1 on arm, "not the kind  that spreads"   SVT-CARDIAC ABLATION     TRANSTHORACIC ECHOCARDIOGRAM  12-10-2012   dr Donnie Aho   mild LVH, ef 55-60%/  mild MR/  mild LAE and RAE   TUBAL LIGATION     UPPER GI ENDOSCOPY     VULVECTOMY N/A 07/25/2017   Procedure: WIDE EXCISION VULVECTOMY;  Surgeon: De Blanch, MD;  Location[facility];  Service: Gynecology;  Laterality: N/A;    Social History   Socioeconomic History   Marital status: Divorced    Spouse name: Not on file   Number of children: 2   Years of education: Not on file   Highest education level: Some college, no degree  Occupational History   Not on file  Tobacco Use   Smoking status: Never   Smokeless tobacco: Never  Vaping Use   Vaping status: Never Used  Substance and Sexual Activity   Alcohol use: Not Currently   Drug use: No   Sexual activity: Not Currently    Birth control/protection: Surgical    Comment: 1st intercourse 77 yo--Fewer than 5 partners  Other Topics Concern   Not on file  Social History Narrative   Not on file   Social Determinants of Health   Financial Resource Strain: Low Risk  (11/18/2022)   Overall Financial Resource Strain (CARDIA)    Difficulty of Paying Living Expenses: Not hard at all  Food Insecurity: No Food Insecurity (11/18/2022)   Hunger Vital Sign    Worried About Running Out  of Food in the Last Year: Never true    Ran Out of Food in the Last Year: Never true  Transportation Needs: No Transportation Needs (11/18/2022)   PRAPARE - Administrator, Civil Service (Medical): No    Lack of Transportation (Non-Medical): No  Physical Activity: Inactive (11/18/2022)   Exercise Vital Sign    Days of Exercise per Week: 0 days    Minutes of Exercise per Session: 30 min  Stress: Stress Concern Present (11/18/2022)   [employer] of Occupational Health - Occupational Stress Questionnaire    Feeling of Stress : To some extent  Social Connections: Moderately Integrated (11/18/2022)   Social Connection and Isolation Panel [NHANES]    Frequency of Communication with Friends and Family: More than three times a week    Frequency of Social Gatherings with Friends and Family: More than three times a week    Attends Religious Services: More than 4 times per year    Active Member of Clubs or Organizations: Yes    Attends  Club or Organization Meetings: More than 4 times per year    Marital Status: Divorced  Recent Concern: Social Connections - Moderately Isolated (09/02/2022)   Social Connection and Isolation Panel [NHANES]    Frequency of Communication with Friends and Family: Three times a week    Frequency of Social Gatherings with Friends and Family: Twice a week    Attends Religious Services: More than 4 times per year    Active Member of Clubs or Organizations: No    Attends Banker Meetings: Never    Marital Status: Divorced  Catering manager Violence: Not At Risk (09/02/2022)   Humiliation, Afraid, Rape, and Kick questionnaire    Fear of Current or Ex-Partner: No    Emotionally Abused: No    Physically Abused: No    Sexually Abused: No     Allergies  Allergen Reactions   Iodinated Contrast Media Hypertension    Patient experienced elevated heart rate, hypertension and had to be taken to the ED   Alpha-Gal     facial redness, upset stomach    Gluten Meal Dermatitis    Bloating    Latex Other (See Comments)   Milk-Related Compounds     facial flushing,  upset stomach, diarrhea   Onion     Other reaction(s): Not available   Shellfish Allergy Hives   Sulfa Antibiotics Hives, Nausea Only and Other (See Comments)   Trazodone     Severe sleepiness   Azithromycin Diarrhea, Palpitations and Other (See Comments)    Cramping    Shellfish-Derived Products     Other Reaction(s): abdominal pain   Wheat Rash    Stomach pain     CBC    Component Value Date/Time   WBC 8.0 11/05/2022 1937   RBC 3.99 11/05/2022 1937   HGB 12.4 11/05/2022 1937   HGB 14.5 01/27/2012 1716   HCT 37.3 11/05/2022 1937   HCT 37.0 06/19/2020 1557   PLT 339 11/05/2022 1937   PLT 303 01/27/2012 1716   MCV 93.5 11/05/2022 1937   MCV 96 01/27/2012 1716   MCH 31.1 11/05/2022 1937   MCHC 33.2 11/05/2022 1937   RDW 14.1 11/05/2022 1937   RDW 13.3 01/27/2012 1716   LYMPHSABS 3.2 10/15/2022 1459   MONOABS 0.7 10/15/2022 1459   EOSABS 0.2 10/15/2022 1459   BASOSABS 0.1 10/15/2022 1459    Pulmonary Functions Testing Results:    Latest Ref Rng & Units 12/17/2022    4:34 PM  PFT Results  FVC-Pre L 2.34   FVC-Predicted Pre % 70   FVC-Post L 2.41   FVC-Predicted Post % 72   Pre FEV1/FVC % % 86   Post FEV1/FCV % % 89   FEV1-Pre L 2.02   FEV1-Predicted Pre % 80   FEV1-Post L 2.14   DLCO uncorrected ml/min/mmHg 16.62   DLCO UNC% % 76   DLVA Predicted % 108   TLC L 4.22   TLC % Predicted % 74   RV % Predicted % 73     Outpatient Medications Prior to Visit  Medication Sig Dispense Refill   acetaminophen (TYLENOL) 500 MG tablet Take 1,000 mg by mouth every 6 (six) hours as needed for moderate pain.     ALPRAZolam (XANAX) 0.5 MG tablet Take 1 tablet (0.5 mg total) by mouth at bedtime as needed for anxiety or sleep. 30 tablet 5   diphenhydrAMINE (BENADRYL) 12.5 MG/5ML liquid Take 37.5 mg by mouth 4 (four) times daily as needed for allergies.  EPINEPHrine 0.3 mg/0.3 mL IJ SOAJ injection Use as directed for severe allergic reaction 2 each 2   ergocalciferol (DRISDOL) 1.25 MG (50000 UT) capsule Take 1 capsule (50,000 Units total) by mouth once a week. 12 capsule 3   estradiol (ESTRACE) 0.1 MG/GM vaginal cream 1 gram vaginally twice weekly 42.5 g 1   hydrocortisone (ANUSOL-HC) 2.5 % rectal cream Place 1 Application rectally 4 (four) times daily. 30 g 1   losartan-hydrochlorothiazide (HYZAAR) 50-12.5 MG tablet Take 1 tablet by mouth daily.     pantoprazole (PROTONIX) 40 MG tablet Take 40 mg by mouth daily.     rosuvastatin (CRESTOR) 20 MG tablet Take 1 tablet (20 mg total) by mouth every other day. 45 tablet 0   sodium chloride (OCEAN) 0.65 % SOLN nasal spray Place 1 spray into both nostrils as needed (dryness).     Vibegron (GEMTESA) 75 MG TABS Take 1 tablet (75 mg total) by mouth daily. (Patient not taking: Reported on 11/18/2022) 42 tablet 0   No facility-administered medications prior to visit.

## 2023-01-10 ENCOUNTER — Other Ambulatory Visit: Payer: Self-pay | Admitting: Internal Medicine

## 2023-01-10 NOTE — Telephone Encounter (Signed)
Requesting: Alprazolam Contract: No UDS: No Last Visit: 11/18/2022 Next Visit: 05/21/2023 Last Refill: 07/25/2022  Please Advise

## 2023-01-16 ENCOUNTER — Ambulatory Visit: Payer: Medicare Other

## 2023-01-16 DIAGNOSIS — E538 Deficiency of other specified B group vitamins: Secondary | ICD-10-CM

## 2023-01-16 MED ORDER — CYANOCOBALAMIN 1000 MCG/ML IJ SOLN
1000.0000 ug | Freq: Once | INTRAMUSCULAR | Status: AC
Start: 2023-01-16 — End: 2023-01-16
  Administered 2023-01-16: 1000 ug via INTRAMUSCULAR

## 2023-01-16 NOTE — Progress Notes (Signed)
Patient presented for B 12 injection to right deltoid, patient voiced no concerns nor showed any signs of distress during injection.

## 2023-01-29 ENCOUNTER — Other Ambulatory Visit: Payer: Self-pay | Admitting: Internal Medicine

## 2023-02-17 ENCOUNTER — Ambulatory Visit (INDEPENDENT_AMBULATORY_CARE_PROVIDER_SITE_OTHER): Payer: Medicare Other | Admitting: *Deleted

## 2023-02-17 DIAGNOSIS — E538 Deficiency of other specified B group vitamins: Secondary | ICD-10-CM | POA: Diagnosis not present

## 2023-02-17 MED ORDER — CYANOCOBALAMIN 1000 MCG/ML IJ SOLN
1000.0000 ug | Freq: Once | INTRAMUSCULAR | Status: AC
Start: 2023-02-17 — End: 2023-02-17
  Administered 2023-02-17: 1000 ug via INTRAMUSCULAR

## 2023-02-17 NOTE — Progress Notes (Addendum)
Pt presented for their vitamin B12 injection. Pt was identified through two identifiers. Pt tolerated shot well in their right deltoid.  

## 2023-03-19 ENCOUNTER — Ambulatory Visit
Admission: EM | Admit: 2023-03-19 | Discharge: 2023-03-19 | Disposition: A | Discharge status: Home / Self Care | Payer: Medicare Other | Attending: Emergency Medicine | Admitting: Emergency Medicine

## 2023-03-19 ENCOUNTER — Ambulatory Visit: Payer: Medicare Other

## 2023-03-19 DIAGNOSIS — E538 Deficiency of other specified B group vitamins: Secondary | ICD-10-CM | POA: Diagnosis not present

## 2023-03-19 DIAGNOSIS — N390 Urinary tract infection, site not specified: Secondary | ICD-10-CM | POA: Insufficient documentation

## 2023-03-19 LAB — POCT URINALYSIS DIP (MANUAL ENTRY)
Bilirubin, UA: NEGATIVE
Blood, UA: NEGATIVE
Glucose, UA: NEGATIVE mg/dL
Ketones, POC UA: NEGATIVE mg/dL
Nitrite, UA: POSITIVE — AB
Protein Ur, POC: NEGATIVE mg/dL
Spec Grav, UA: 1.025 (ref 1.010–1.025)
Urobilinogen, UA: 0.2 U/dL
pH, UA: 7 (ref 5.0–8.0)

## 2023-03-19 MED ORDER — CEPHALEXIN 500 MG PO CAPS: 500.0000 mg | ORAL_CAPSULE | Freq: Two times a day (BID) | ORAL | 0 refills | Status: AC

## 2023-03-19 MED ORDER — CYANOCOBALAMIN 1000 MCG/ML IJ SOLN
1000.0000 ug | Freq: Once | INTRAMUSCULAR | Status: AC
Start: 2023-03-19 — End: 2023-03-19
  Administered 2023-03-19: 1000 ug via INTRAMUSCULAR

## 2023-03-19 NOTE — ED Triage Notes (Signed)
Patient to Urgent Care with complaints of urinary frequency/ urgency/ dysuria.  Symptoms started approx 1 week.

## 2023-03-19 NOTE — ED Provider Notes (Signed)
Anna [name]    CSN: [id number] Arrival date & time: 03/19/23  1630      History   Chief Complaint Chief Complaint  Patient presents with   Dysuria   Urinary Frequency    HPI Tony Schurig is a 77 y.o. female.  Patient presents with 1 week history of dysuria, urinary frequency, urinary urgency.  No fever, abdominal pain, hematuria, flank pain.  No OTC medications.  The history is provided by the patient and medical records.    Past Medical History:  Diagnosis Date   Acid reflux    Allergy to alpha-gal    Anxiety    Bilateral cataracts    Cancer (HCC)    Basal Cell Carcinoma, Melanoma   CIN I (cervical intraepithelial neoplasia I)    Colon polyps    benign   H/O squamous cell carcinoma excision    multiple areas  on body per pt   Hemorrhoid    History of basal cell carcinoma (BCC) excision    History of melanoma excision RIGHT LEG  --  2012   history of SVT (supraventricular tachycardia) (HCC) 10/31/2014   resolved   History of Wolff-Parkinson-White (WPW) syndrome    1996  s/p  ablation   Hypertension    IBS (irritable bowel syndrome)    Lyme disease 2005   Mild carotid artery disease (HCC)    per duplex 12-10-2012  bilateral ICA 1-39% stenosis   OsteoArthritis    Sleep apnea    does not use cpap, going back for cpap titration, mild osa per pt   UTI (urinary tract infection)    finished 7 day course on 08-28-2021 all symptoms resolved    Patient Active Problem List   Diagnosis Date Noted   Shortness of breath 12/17/2022   Dysphagia 10/16/2022   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   Thoracic aortic atherosclerosis (HCC) 09/02/2022   Exercise hypoxemia 09/02/2022   Dizziness 08/15/2022   Fatigue 08/15/2022   Bloating symptom 08/15/2022   Overactive bladder 08/14/2022   Recurrent UTI 06/26/2022   History of Wolff-Parkinson-White (WPW) syndrome 12/25/2021   Post-menopausal bleeding 05/13/2021   Gastroesophageal reflux  disease 03/13/2021   Hematochezia 03/13/2021   History of colonic polyps 03/13/2021   OSA (obstructive sleep apnea) 11/09/2020   Edema 09/20/2020   Essential hypertension 09/20/2020   B12 deficiency 06/24/2020   Vitamin D deficiency 06/24/2020   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   Allergy to alpha-gal 06/20/2020   Intestinal malabsorption 06/20/2020   Irritable bowel syndrome with constipation 01/04/2020   Prolapsed internal hemorrhoids, grade 4 01/04/2020   Internal and external bleeding hemorrhoids 01/04/2020   Melanoma (HCC) 03/22/2019   Heart palpitations 03/22/2019   Hyperlipidemia LDL goal <100 04/27/2014   Chronic osteoarthritis 04/27/2014    Past Surgical History:  Procedure Laterality Date   BREAST SURGERY Left 1999   BREAST MASS EXCISED, benign   CARDIAC ELECTROPHYSIOLOGY STUDY AND ABLATION  1996   SUCCESSFUL ABLATION OF WOLFE-PARKINSON-WHITE SYNDROME  (NO ISSUES SINCE)   CATARACT EXTRACTION, BILATERAL     CERVICAL BIOPSY  W/ LOOP ELECTRODE EXCISION  2005   dr gottsegen   COLONOSCOPY     2019 or 2020   COLPOSCOPY     DILATATION & CURETTAGE/HYSTEROSCOPY WITH MYOSURE N/A 09/17/2021   Procedure: DILATATION & CURETTAGE/HYSTEROSCOPY WITH MYOSURE;  Surgeon: Romualdo Bolk, MD;  Location: [facility] [place];  Service: Gynecology;  Laterality: N/A;   HYSTEROSCOPY WITH D & C  02/13/2012   Procedure: DILATATION AND CURETTAGE /HYSTEROSCOPY;  Surgeon: Trellis Paganini, MD;  Location: [facility];  Service: Gynecology;  Laterality: N/A;  deficit - 710   LAPAROSCOPIC CHOLECYSTECTOMY  05-16-2005  dr Luisa [facility]   melanoma excised     x  3 , 2 on legs and 1 on arm, "not the kind  that spreads"   SVT-CARDIAC ABLATION     TRANSTHORACIC ECHOCARDIOGRAM  12-10-2012   dr Donnie Aho   mild LVH, ef 55-60%/  mild MR/  mild LAE and RAE   TUBAL LIGATION     UPPER GI ENDOSCOPY     VULVECTOMY N/A 07/25/2017   Procedure: WIDE EXCISION VULVECTOMY;  Surgeon:  De Blanch, MD;  Location: [facility];  Service: Gynecology;  Laterality: N/A;    OB History     Gravida  2   Para  2   Term  2   Preterm      AB      Living  2      SAB      IAB      Ectopic      Multiple      Live Births               Home Medications    Prior to Admission medications   Medication Sig Start Date End Date Taking? Authorizing Provider  cephALEXin (KEFLEX) 500 MG capsule Take 1 capsule (500 mg total) by mouth 2 (two) times daily for 5 days. 03/19/23 03/24/23 Yes Mickie Bail, NP  acetaminophen (TYLENOL) 500 MG tablet Take 1,000 mg by mouth every 6 (six) hours as needed for moderate pain.    [provider]  albuterol (VENTOLIN HFA) 108 (90 Base) MCG/ACT inhaler Inhale 1-2 puffs into the lungs every 4 (four) hours as needed for wheezing or shortness of breath. 12/18/22   Raechel Chute, MD  ALPRAZolam Prudy Feeler) 0.5 MG tablet TAKE 1 TABLET (0.5 MG TOTAL) BY MOUTH AT BEDTIME AS NEEDED FOR ANXIETY OR SLEEP. 01/13/23   Sherlene Shams, MD  diphenhydrAMINE (BENADRYL) 12.5 MG/5ML liquid Take 37.5 mg by mouth 4 (four) times daily as needed for allergies.    [provider]  EPINEPHrine 0.3 mg/0.3 mL IJ SOAJ injection Use as directed for severe allergic reaction 06/26/22   Sherlene Shams, MD  ergocalciferol (DRISDOL) 1.25 MG (50000 UT) capsule Take 1 capsule (50,000 Units total) by mouth once a week. 08/17/22   Sherlene Shams, MD  estradiol (ESTRACE) 0.1 MG/GM vaginal cream 1 gram vaginally twice weekly 10/15/22   Sherlene Shams, MD  hydrocortisone (ANUSOL-HC) 2.5 % rectal cream Place 1 Application rectally 4 (four) times daily. 10/15/22   Sherlene Shams, MD  losartan-hydrochlorothiazide (HYZAAR) 50-12.5 MG tablet Take 1 tablet by mouth daily. 11/18/22   Sherlene Shams, MD  pantoprazole (PROTONIX) 40 MG tablet Take 40 mg by mouth daily. 12/06/22   [provider]  rosuvastatin (CRESTOR) 20 MG tablet TAKE 1  TABLET EVERY OTHER DAY 01/31/23   Sherlene Shams, MD  sodium chloride (OCEAN) 0.65 % SOLN nasal spray Place 1 spray into both nostrils as needed (dryness).    [provider]  Vibegron (GEMTESA) 75 MG TABS Take 1 tablet (75 mg total) by mouth daily. Patient not taking: Reported on 11/18/2022 07/01/22   Carman Ching, PA-C    Family History Family History  Problem Relation Age of Onset   Cancer Mother  ESOPHAGEAL CANCER   Diabetes Father    Hyperlipidemia Father    Heart disease Father    Heart attack Father    Heart disease Brother    Breast cancer Paternal Aunt        Age 65's   Hyperlipidemia Paternal Grandmother    Heart disease Paternal Grandmother    Stroke Paternal Grandmother    Diabetes Paternal Grandfather    Hyperlipidemia Paternal Grandfather    Heart disease Paternal Grandfather    Stroke Paternal Grandfather    Cancer Sister        skin   Stroke Maternal Grandmother    Stroke Maternal Grandfather    Diabetes Brother     Social History Social History   Tobacco Use   Smoking status: Never   Smokeless tobacco: Never  Vaping Use   Vaping status: Never Used  Substance Use Topics   Alcohol use: Not Currently   Drug use: No     Allergies   Iodinated contrast media, Alpha-gal, Gluten meal, Latex, Milk-related compounds, Onion, Shellfish allergy, Sulfa antibiotics, Trazodone, Azithromycin, Shellfish-derived products, and Wheat   Review of Systems Review of Systems  Constitutional:  Negative for chills and fever.  Gastrointestinal:  Negative for abdominal pain, nausea and vomiting.  Genitourinary:  Positive for dysuria, frequency and urgency. Negative for flank pain and hematuria.     Physical Exam Triage Vital Signs ED Triage Vitals  Encounter Vitals Group     BP      Systolic BP Percentile      Diastolic BP Percentile      Pulse      Resp      Temp      Temp src      SpO2      Weight      Height      Head Circumference       Peak Flow      Pain Score      Pain Loc      Pain Education      Exclude from Growth Chart    No data found.  Updated Vital Signs BP 122/79   Pulse 79   Temp 97.7 F (36.5 C)   Resp 16   SpO2 98%   Visual Acuity Right Eye Distance:   Left Eye Distance:   Bilateral Distance:    Right Eye Near:   Left Eye Near:    Bilateral Near:     Physical Exam Constitutional:      General: She is not in acute distress. HENT:     Mouth/Throat:     Mouth: Mucous membranes are moist.  Cardiovascular:     Rate and Rhythm: Normal rate and regular rhythm.  Pulmonary:     Effort: Pulmonary effort is normal. No respiratory distress.  Abdominal:     General: Bowel sounds are normal.     Palpations: Abdomen is soft.     Tenderness: There is no abdominal tenderness. There is no right CVA tenderness, left CVA tenderness, guarding or rebound.  Skin:    General: Skin is warm and dry.  Neurological:     Mental Status: She is alert.      UC Treatments / Results  Labs (all labs ordered are listed, but only abnormal results are displayed) Labs Reviewed  POCT URINALYSIS DIP (MANUAL ENTRY) - Abnormal; Notable for the following components:      Result Value   Clarity, UA cloudy (*)    Nitrite, UA Positive (*)  Leukocytes, UA Small (1+) (*)    All other components within normal limits  URINE CULTURE    EKG   Radiology No results found.  Procedures Procedures (including critical care time)  Medications Ordered in UC Medications - No data to display  Initial Impression / Assessment and Plan / UC Course  I have reviewed the triage vital signs and the nursing notes.  Pertinent labs & imaging results that were available during my care of the patient were reviewed by me and considered in my medical decision making (see chart for details).    UTI.  Treating with Keflex. Urine culture pending. Discussed with patient that we will call her if the urine culture shows the need to  change or discontinue the antibiotic. Instructed her to follow-up with her PCP if her symptoms are not improving. Patient agrees to plan of care.     Final Clinical Impressions(s) / UC Diagnoses   Final diagnoses:  Urinary tract infection without hematuria, site unspecified     Discharge Instructions      Take the antibiotic as directed.  The urine culture is pending.  We will call you if it shows the need to change or discontinue your antibiotic.    Follow-up with your primary care provider if your symptoms are not improving.      ED Prescriptions     Medication Sig Dispense Auth. Provider   cephALEXin (KEFLEX) 500 MG capsule Take 1 capsule (500 mg total) by mouth 2 (two) times daily for 5 days. 10 capsule Mickie Bail, NP      PDMP not reviewed this encounter.   Mickie Bail, NP 03/19/23 1726

## 2023-03-19 NOTE — Progress Notes (Signed)
Patient presented for B 12 injection to left deltoid, patient voiced no concerns nor showed any signs of distress during injection. 

## 2023-03-19 NOTE — Discharge Instructions (Signed)
Take the antibiotic as directed.  The urine culture is pending.  We will call you if it shows the need to change or discontinue your antibiotic.    Follow up with your primary care provider if your symptoms are not improving.    

## 2023-03-21 LAB — URINE CULTURE: Culture: >=100000 — AB

## 2023-04-07 HISTORY — PX: COLON RESECTION: SHX5231

## 2023-04-12 ENCOUNTER — Other Ambulatory Visit: Payer: Self-pay

## 2023-04-12 ENCOUNTER — Emergency Department
Admission: EM | Admit: 2023-04-12 | Discharge: 2023-04-12 | Disposition: A | Discharge status: Home / Self Care | Payer: Medicare Other | Attending: Emergency Medicine | Admitting: Emergency Medicine

## 2023-04-12 ENCOUNTER — Emergency Department: Payer: Medicare Other

## 2023-04-12 DIAGNOSIS — Z85038 Personal history of other malignant neoplasm of large intestine: Secondary | ICD-10-CM | POA: Diagnosis not present

## 2023-04-12 DIAGNOSIS — R109 Unspecified abdominal pain: Secondary | ICD-10-CM | POA: Insufficient documentation

## 2023-04-12 DIAGNOSIS — R112 Nausea with vomiting, unspecified: Secondary | ICD-10-CM | POA: Insufficient documentation

## 2023-04-12 DIAGNOSIS — G8918 Other acute postprocedural pain: Secondary | ICD-10-CM | POA: Diagnosis not present

## 2023-04-12 LAB — COMPREHENSIVE METABOLIC PANEL
ALT: 13 U/L (ref 0–44)
AST: 13 U/L — ABNORMAL LOW (ref 15–41)
Albumin: 3 g/dL — ABNORMAL LOW (ref 3.5–5.0)
Alkaline Phosphatase: 58 U/L (ref 38–126)
Anion gap: 9 (ref 5–15)
BUN: 6 mg/dL — ABNORMAL LOW (ref 8–23)
CO2: 26 mmol/L (ref 22–32)
Calcium: 8.6 mg/dL — ABNORMAL LOW (ref 8.9–10.3)
Chloride: 101 mmol/L (ref 98–111)
Creatinine, Ser: 0.74 mg/dL (ref 0.44–1.00)
GFR, Estimated: >60 mL/min (ref >60)
Glucose, Bld: 154 mg/dL — ABNORMAL HIGH (ref 70–99)
Potassium: 3.6 mmol/L (ref 3.5–5.1)
Sodium: 136 mmol/L (ref 135–145)
Total Bilirubin: 0.7 mg/dL (ref 0.0–1.2)
Total Protein: 6.7 g/dL (ref 6.5–8.1)

## 2023-04-12 LAB — CBC
HCT: 32.4 % — ABNORMAL LOW (ref 36.0–46.0)
Hemoglobin: 10.6 g/dL — ABNORMAL LOW (ref 12.0–15.0)
MCH: 31.5 pg (ref 26.0–34.0)
MCHC: 32.7 g/dL (ref 30.0–36.0)
MCV: 96.1 fL (ref 80.0–100.0)
Platelets: 375 10*3/uL (ref 150–400)
RBC: 3.37 MIL/uL — ABNORMAL LOW (ref 3.87–5.11)
RDW: 13.9 % (ref 11.5–15.5)
WBC: 8.4 10*3/uL (ref 4.0–10.5)
nRBC: 0 % (ref 0.0–0.2)

## 2023-04-12 LAB — LACTIC ACID, PLASMA: Lactic Acid, Venous: 0.9 mmol/L (ref 0.5–1.9)

## 2023-04-12 LAB — LIPASE, BLOOD: Lipase: 18 U/L (ref 11–51)

## 2023-04-12 MED ORDER — ONDANSETRON HCL 4 MG/2ML IJ SOLN
4.0000 mg | Freq: Once | INTRAMUSCULAR | Status: AC
  Administered 2023-04-12: 4 mg via INTRAVENOUS
  Filled 2023-04-12: qty 2

## 2023-04-12 MED ORDER — MORPHINE SULFATE (PF) 4 MG/ML IV SOLN
4.0000 mg | Freq: Once | INTRAVENOUS | Status: AC
  Administered 2023-04-12: 4 mg via INTRAVENOUS
  Filled 2023-04-12: qty 1

## 2023-04-12 MED ORDER — ONDANSETRON 4 MG PO TBDP: 4.0000 mg | ORAL_TABLET | Freq: Three times a day (TID) | ORAL | 0 refills | Status: AC | PRN

## 2023-04-12 MED ORDER — ONDANSETRON 4 MG PO TBDP
4.0000 mg | ORAL_TABLET | Freq: Once | ORAL | Status: AC
Start: 2023-04-12 — End: 2023-04-12
  Administered 2023-04-12: 4 mg via ORAL
  Filled 2023-04-12: qty 1

## 2023-04-12 MED ORDER — ONDANSETRON 4 MG PO TBDP
4.0000 mg | ORAL_TABLET | Freq: Once | ORAL | Status: AC | PRN
  Administered 2023-04-12: 4 mg via ORAL
  Filled 2023-04-12: qty 1

## 2023-04-12 MED ORDER — SODIUM CHLORIDE 0.9 % IV SOLN
Freq: Once | INTRAVENOUS | Status: AC
Start: 2023-04-12 — End: 2023-04-12
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
## 2023-04-12 NOTE — ED Notes (Signed)
 See triage note  States she was just released from the hospital s/p colon resection  States she developed diarrhea with nausea  No fever

## 2023-04-12 NOTE — ED Triage Notes (Signed)
 Pt to ED via [facility] from home. Pt reports pt had colon resection Monday at [facility]. Pt took amatrol PTA. Pt reports nausea started last pm. Pt reports she was not suppose to take anything for diarrhea.  EMS VS: 157/75 97.9 97 HR

## 2023-04-27 ENCOUNTER — Encounter: Payer: Self-pay | Admitting: Internal Medicine

## 2023-04-29 ENCOUNTER — Encounter: Payer: Self-pay | Admitting: Internal Medicine

## 2023-05-11 ENCOUNTER — Other Ambulatory Visit: Payer: Self-pay | Admitting: Internal Medicine

## 2023-05-21 ENCOUNTER — Ambulatory Visit: Payer: BLUE CROSS/BLUE SHIELD | Admitting: Internal Medicine

## 2023-05-22 ENCOUNTER — Ambulatory Visit
Admission: EM | Admit: 2023-05-22 | Discharge: 2023-05-22 | Disposition: A | Discharge status: Home / Self Care | Payer: Medicare Other | Attending: Emergency Medicine | Admitting: Emergency Medicine

## 2023-05-22 DIAGNOSIS — R350 Frequency of micturition: Secondary | ICD-10-CM | POA: Diagnosis not present

## 2023-05-22 LAB — POCT URINALYSIS DIP (MANUAL ENTRY)
Bilirubin, UA: NEGATIVE
Blood, UA: NEGATIVE
Glucose, UA: NEGATIVE mg/dL
Ketones, POC UA: NEGATIVE mg/dL
Leukocytes, UA: NEGATIVE
Nitrite, UA: NEGATIVE
Protein Ur, POC: NEGATIVE mg/dL
Spec Grav, UA: >=1.03 — AB
Urobilinogen, UA: 0.2 U/dL
pH, UA: 5.5

## 2023-05-22 MED ORDER — CEPHALEXIN 500 MG PO CAPS: 500.0000 mg | ORAL_CAPSULE | Freq: Three times a day (TID) | ORAL | 0 refills | Status: AC

## 2023-05-22 NOTE — ED Triage Notes (Signed)
 Triaged by provider

## 2023-05-22 NOTE — Discharge Instructions (Addendum)
 Your urinalysis does not show infection which are indicative of infection, your urine will be sent to the lab to determine exactly which bacteria is present, if any changes need to be made to your medications you will be notified  Begin use of cephalexin every 8 hours for 5 days  You may use over-the-counter Azo to help minimize your symptoms until antibiotic removes bacteria, this medication will turn your urine orange  Increase your fluid intake through use of water  As always practice good hygiene, wiping front to back and avoidance of scented vaginal products to prevent further irritation  If symptoms continue to persist after use of medication or recur please follow-up with urgent care or your primary doctor as needed

## 2023-05-22 NOTE — ED Provider Notes (Signed)
 Anna [name]    CSN: [id number] Arrival date & time: 05/22/23  1416      History   Chief Complaint No chief complaint on file.   HPI Shauniece [name] is a 78 y.o. female.   Patient presents for evaluation of lower abdominal pressure, lower back pain, dysuria, urinary frequency and urgency present for 3 days.  Has not attempted treatment.  Denies hematuria or fever.  Endorses history of reoccurring infection.  Past Medical History:  Diagnosis Date   Acid reflux    Allergy to alpha-gal    Anxiety    Bilateral cataracts    Cancer (HCC)    Basal Cell Carcinoma, Melanoma   CIN I (cervical intraepithelial neoplasia I)    Colon polyps    benign   H/O squamous cell carcinoma excision    multiple areas  on body per pt   Hemorrhoid    History of basal cell carcinoma (BCC) excision    History of melanoma excision RIGHT LEG  --  2012   history of SVT (supraventricular tachycardia) (HCC) 10/31/2014   resolved   History of Wolff-Parkinson-Ruqayya Ventress (WPW) syndrome    1996  s/p  ablation   Hypertension    IBS (irritable bowel syndrome)    Lyme disease 2005   Mild carotid artery disease (HCC)    per duplex 12-10-2012  bilateral ICA 1-39% stenosis   OsteoArthritis    Sleep apnea    does not use cpap, going back for cpap titration, mild osa per pt   UTI (urinary tract infection)    finished 7 day course on 08-28-2021 all symptoms resolved    Patient Active Problem List   Diagnosis Date Noted   Shortness of breath 12/17/2022   Dysphagia 10/16/2022   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   Thoracic aortic atherosclerosis (HCC) 09/02/2022   Exercise hypoxemia 09/02/2022   Dizziness 08/15/2022   Fatigue 08/15/2022   Bloating symptom 08/15/2022   Overactive bladder 08/14/2022   Recurrent UTI 06/26/2022   History of Wolff-Parkinson-Aleta Manternach (WPW) syndrome 12/25/2021   Post-menopausal bleeding 05/13/2021   Gastroesophageal reflux disease 03/13/2021    Hematochezia 03/13/2021   History of colonic polyps 03/13/2021   OSA (obstructive sleep apnea) 11/09/2020   Edema 09/20/2020   Essential hypertension 09/20/2020   B12 deficiency 06/24/2020   Vitamin D deficiency 06/24/2020   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   Allergy to alpha-gal 06/20/2020   Intestinal malabsorption 06/20/2020   Irritable bowel syndrome with constipation 01/04/2020   Prolapsed internal hemorrhoids, grade 4 01/04/2020   Internal and external bleeding hemorrhoids 01/04/2020   Melanoma (HCC) 03/22/2019   Heart palpitations 03/22/2019   Hyperlipidemia LDL goal <100 04/27/2014   Chronic osteoarthritis 04/27/2014    Past Surgical History:  Procedure Laterality Date   BREAST SURGERY Left 1999   BREAST MASS EXCISED, benign   CARDIAC ELECTROPHYSIOLOGY STUDY AND ABLATION  1996   SUCCESSFUL ABLATION OF WOLFE-PARKINSON-Kiaja Shorty SYNDROME  (NO ISSUES SINCE)   CATARACT EXTRACTION, BILATERAL     CERVICAL BIOPSY  W/ LOOP ELECTRODE EXCISION  2005   dr gottsegen   COLONOSCOPY     2019 or 2020   COLPOSCOPY     DILATATION & CURETTAGE/HYSTEROSCOPY WITH MYOSURE N/A 09/17/2021   Procedure: DILATATION & CURETTAGE/HYSTEROSCOPY WITH MYOSURE;  Surgeon: Romualdo Bolk, MD;  Location: [facility];  Service: Gynecology;  Laterality: N/A;   HYSTEROSCOPY WITH D & C  02/13/2012   Procedure: DILATATION AND CURETTAGE /HYSTEROSCOPY;  Surgeon: Trellis Paganini, MD;  Location: [facility];  Service: Gynecology;  Laterality: N/A;  deficit - 710   LAPAROSCOPIC CHOLECYSTECTOMY  05-16-2005  dr Luisa Hart [facility]   melanoma excised     x  3 , 2 on legs and 1 on arm, "not the kind  that spreads"   SVT-CARDIAC ABLATION     TRANSTHORACIC ECHOCARDIOGRAM  12-10-2012   dr Donnie Aho   mild LVH, ef 55-60%/  mild MR/  mild LAE and RAE   TUBAL LIGATION     UPPER GI ENDOSCOPY     VULVECTOMY N/A 07/25/2017   Procedure: WIDE EXCISION VULVECTOMY;  Surgeon: De Blanch, MD;  Location: [facility];  Service: Gynecology;  Laterality: N/A;    OB History     Gravida  2   Para  2   Term  2   Preterm      AB      Living  2      SAB      IAB      Ectopic      Multiple      Live Births               Home Medications    Prior to Admission medications   Medication Sig Start Date End Date Taking? Authorizing Provider  acetaminophen (TYLENOL) 500 MG tablet Take 1,000 mg by mouth every 6 (six) hours as needed for moderate pain.    [provider]  albuterol (VENTOLIN HFA) 108 (90 Base) MCG/ACT inhaler Inhale 1-2 puffs into the lungs every 4 (four) hours as needed for wheezing or shortness of breath. 12/18/22   Raechel Chute, MD  ALPRAZolam Prudy Feeler) 0.5 MG tablet TAKE 1 TABLET (0.5 MG TOTAL) BY MOUTH AT BEDTIME AS NEEDED FOR ANXIETY OR SLEEP. 05/13/23   Sherlene Shams, MD  cephALEXin (KEFLEX) 500 MG capsule Take 1 capsule (500 mg total) by mouth 3 (three) times daily for 5 days. 05/22/23 05/27/23 Yes Bhavesh Vazquez, Elita Boone, NP  diphenhydrAMINE (BENADRYL) 12.5 MG/5ML liquid Take 37.5 mg by mouth 4 (four) times daily as needed for allergies.    [provider]  EPINEPHrine 0.3 mg/0.3 mL IJ SOAJ injection Use as directed for severe allergic reaction 06/26/22   Sherlene Shams, MD  ergocalciferol (DRISDOL) 1.25 MG (50000 UT) capsule Take 1 capsule (50,000 Units total) by mouth once a week. 08/17/22   Sherlene Shams, MD  estradiol (ESTRACE) 0.1 MG/GM vaginal cream 1 gram vaginally twice weekly 10/15/22   Sherlene Shams, MD  hydrocortisone (ANUSOL-HC) 2.5 % rectal cream Place 1 Application rectally 4 (four) times daily. 10/15/22   Sherlene Shams, MD  losartan-hydrochlorothiazide (HYZAAR) 50-12.5 MG tablet Take 1 tablet by mouth daily. 11/18/22   Sherlene Shams, MD  ondansetron (ZOFRAN-ODT) 4 MG disintegrating tablet Take 1 tablet (4 mg total) by mouth every 8 (eight) hours as needed for nausea or vomiting. 04/12/23    Jene Every, MD  pantoprazole (PROTONIX) 40 MG tablet Take 40 mg by mouth daily. 12/06/22   [provider]  rosuvastatin (CRESTOR) 20 MG tablet TAKE 1 TABLET EVERY OTHER DAY 01/31/23   Sherlene Shams, MD  sodium chloride (OCEAN) 0.65 % SOLN nasal spray Place 1 spray into both nostrils as needed (dryness).    [provider]  Vibegron (GEMTESA) 75 MG TABS Take 1 tablet (75 mg total) by mouth daily. Patient not taking: Reported on 11/18/2022 07/01/22   Carman Ching,  PA-C    Family History Family History  Problem Relation Age of Onset   Cancer Mother        ESOPHAGEAL CANCER   Diabetes Father    Hyperlipidemia Father    Heart disease Father    Heart attack Father    Heart disease Brother    Breast cancer Paternal Aunt        Age 78's   Hyperlipidemia Paternal Grandmother    Heart disease Paternal Grandmother    Stroke Paternal Grandmother    Diabetes Paternal Grandfather    Hyperlipidemia Paternal Grandfather    Heart disease Paternal Grandfather    Stroke Paternal Grandfather    Cancer Sister        skin   Stroke Maternal Grandmother    Stroke Maternal Grandfather    Diabetes Brother     Social History Social History   Tobacco Use   Smoking status: Never   Smokeless tobacco: Never  Vaping Use   Vaping status: Never Used  Substance Use Topics   Alcohol use: Not Currently   Drug use: No     Allergies   Iodinated contrast media, Alpha-gal, Gluten meal, Latex, Milk-related compounds, Onion, Shellfish allergy, Sulfa antibiotics, Trazodone, Azithromycin, Shellfish-derived products, and Wheat   Review of Systems Review of Systems   Physical Exam Triage Vital Signs ED Triage Vitals  Encounter Vitals Group     BP      Systolic BP Percentile      Diastolic BP Percentile      Pulse      Resp      Temp      Temp src      SpO2      Weight      Height      Head Circumference      Peak Flow      Pain Score      Pain Loc      Pain  Education      Exclude from Growth Chart    No data found.  Updated Vital Signs There were no vitals taken for this visit.  Visual Acuity Right Eye Distance:   Left Eye Distance:   Bilateral Distance:    Right Eye Near:   Left Eye Near:    Bilateral Near:     Physical Exam Constitutional:      Appearance: Normal appearance.  Eyes:     Extraocular Movements: Extraocular movements intact.  Abdominal:     Tenderness: There is no abdominal tenderness. There is no right CVA tenderness or left CVA tenderness.  Neurological:     Mental Status: She is alert and oriented to person, place, and time. Mental status is at baseline.      UC Treatments / Results  Labs (all labs ordered are listed, but only abnormal results are displayed) Labs Reviewed - No data to display  EKG   Radiology No results found.  Procedures Procedures (including critical care time)  Medications Ordered in UC Medications - No data to display  Initial Impression / Assessment and Plan / UC Course  I have reviewed the triage vital signs and the nursing notes.  Pertinent labs & imaging results that were available during my care of the patient were reviewed by me and considered in my medical decision making (see chart for details).  Urinary frequency  Urinalysis negative, sent for culture, treating prophylactically if she is symptomatic and has a history of reoccurring infection, prescribed cephalexin based on last  urine culture available in December 2024, recommended additional supportive care with follow-up as needed Final Clinical Impressions(s) / UC Diagnoses   Final diagnoses:  Urinary frequency     Discharge Instructions      Your urinalysis does not show infection which are indicative of infection, your urine will be sent to the lab to determine exactly which bacteria is present, if any changes need to be made to your medications you will be notified  Begin use of cephalexin every 8  hours for 5 days  You may use over-the-counter Azo to help minimize your symptoms until antibiotic removes bacteria, this medication will turn your urine orange  Increase your fluid intake through use of water  As always practice good hygiene, wiping front to back and avoidance of scented vaginal products to prevent further irritation  If symptoms continue to persist after use of medication or recur please follow-up with urgent care or your primary doctor as needed    ED Prescriptions     Medication Sig Dispense Auth. Provider   cephALEXin (KEFLEX) 500 MG capsule Take 1 capsule (500 mg total) by mouth 3 (three) times daily for 5 days. 15 capsule Jujhar Everett, Elita Boone, NP      PDMP not reviewed this encounter.   Valinda Hoar, [place] 05/22/23 [phone]

## 2023-05-23 LAB — URINE CULTURE: Culture: NO GROWTH

## 2023-06-03 ENCOUNTER — Encounter: Payer: Self-pay | Admitting: Internal Medicine

## 2023-06-03 ENCOUNTER — Ambulatory Visit: Payer: BLUE CROSS/BLUE SHIELD | Admitting: Internal Medicine

## 2023-06-03 VITALS — BP 136/74 | HR 90 | Ht 68.0 in | Wt 190.4 lb

## 2023-06-03 DIAGNOSIS — K55069 Acute infarction of intestine, part and extent unspecified: Secondary | ICD-10-CM

## 2023-06-03 DIAGNOSIS — F409 Phobic anxiety disorder, unspecified: Secondary | ICD-10-CM

## 2023-06-03 DIAGNOSIS — D509 Iron deficiency anemia, unspecified: Secondary | ICD-10-CM

## 2023-06-03 DIAGNOSIS — C189 Malignant neoplasm of colon, unspecified: Secondary | ICD-10-CM

## 2023-06-03 DIAGNOSIS — F5105 Insomnia due to other mental disorder: Secondary | ICD-10-CM

## 2023-06-03 DIAGNOSIS — Z09 Encounter for follow-up examination after completed treatment for conditions other than malignant neoplasm: Secondary | ICD-10-CM | POA: Insufficient documentation

## 2023-06-03 DIAGNOSIS — I10 Essential (primary) hypertension: Secondary | ICD-10-CM

## 2023-06-03 DIAGNOSIS — E538 Deficiency of other specified B group vitamins: Secondary | ICD-10-CM

## 2023-06-03 DIAGNOSIS — R0902 Hypoxemia: Secondary | ICD-10-CM

## 2023-06-03 DIAGNOSIS — N952 Postmenopausal atrophic vaginitis: Secondary | ICD-10-CM

## 2023-06-03 DIAGNOSIS — Z7901 Long term (current) use of anticoagulants: Secondary | ICD-10-CM

## 2023-06-03 MED ORDER — PANTOPRAZOLE SODIUM 40 MG PO TBEC: 40.0000 mg | DELAYED_RELEASE_TABLET | Freq: Every day | ORAL | 1 refills | Status: DC

## 2023-06-03 MED ORDER — LOSARTAN POTASSIUM-HCTZ 50-12.5 MG PO TABS: 1.0000 | ORAL_TABLET | Freq: Every day | ORAL | 1 refills | Status: AC

## 2023-06-03 MED ORDER — ROSUVASTATIN CALCIUM 20 MG PO TABS: 20.0000 mg | ORAL_TABLET | ORAL | 3 refills | Status: DC

## 2023-06-03 MED ORDER — ESTRADIOL 0.1 MG/GM VA CREA: TOPICAL_CREAM | VAGINAL | 1 refills | Status: DC

## 2023-06-03 MED ORDER — HYDROCORTISONE (PERIANAL) 2.5 % EX CREA: 1.0000 | TOPICAL_CREAM | Freq: Four times a day (QID) | CUTANEOUS | 1 refills | Status: AC

## 2023-06-03 MED ORDER — ALPRAZOLAM ER 0.5 MG PO TB24: 0.5000 mg | ORAL_TABLET | Freq: Every day | ORAL | 2 refills | Status: DC

## 2023-06-03 NOTE — Patient Instructions (Addendum)
 Return for non fasting labs tomorrow (iron, b12,  hemoglobin )  If your B12 level is normal,  you can use sublingual B12 drops daily instead of B12 injections  if it is low,  we will resume injections and let you do them at home.   If your iron stores are low I will recommend iron supplementation by mouth   Continue the losartan/hct   I am making a referral to our clinical pharmacist to see if the Lovenox cost can be reduced   You do NOT NEED OXYGEN!  YOU CAN RETURN IT   I am changing alprazolam to alprazolam XR .  It should provide a longer period of sleep.  If we need to increase the dose above 0.5 mg ,  let me know and I will change it.

## 2023-06-03 NOTE — Progress Notes (Unsigned)
 Subjective:  Patient ID: Anna [name], female    DOB: 1945-09-27  Age: 78 y.o. MRN: [id number]  CC: The primary encounter diagnosis was Hospital discharge follow-up. Diagnoses of Atrophic vaginitis, B12 deficiency, Iron deficiency anemia, unspecified iron deficiency anemia type, Essential hypertension, Thrombosis of mesenteric vein (HCC), Chronic anticoagulation, Exercise hypoxemia, Adenocarcinoma of colon (HCC), and Insomnia due to anxiety and fear were also pertinent to this visit.   HPI Anna [name] presents for  Chief Complaint  Patient presents with   Hospitalization Follow-up   Kimberle was last seen in mid August several days after her colonoscopy was done which was abnormal.  .  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 She underwent a CT abd/pelvis which revealed concern for an SMV thrombus. She was admitted for further monitoring and for treatment of suspected SMV thrombus. She was discharged home on March 2 with Lovenox  SQ injections 90 mg q 12.    She has been having some rectal bleeding  which occurs without constipation/hard stools since discharge from [facility] on, March 2 . Review of [facility] records via EPIC portal notes that her hgb was 10.9 on Feb 27 and 10.3 on March 2 ,  she has external hemorrhoids , diet has been prudent,  energy level low but denies shortness of breath   Next appwith her surgeon t is March 18   B12 deficiency with   IF ab positive .  She has had monthly injections but missed January due to hospitalization     Outpatient Medications Prior to Visit  Medication Sig Dispense Refill   acetaminophen (TYLENOL)  500 MG tablet Take 1,000 mg by mouth every 6 (six) hours as needed for moderate pain.     albuterol (VENTOLIN HFA) 108 (90 Base) MCG/ACT inhaler Inhale 1-2 puffs into the lungs every 4 (four) hours as needed for wheezing or shortness of breath. 8 g 2   diphenhydrAMINE (BENADRYL) 12.5 MG/5ML liquid Take 37.5 mg by mouth 4 (four) times daily as needed for allergies.     EPINEPHrine 0.3 mg/0.3 mL IJ SOAJ injection Use as directed for severe allergic reaction 2 each 2   ergocalciferol (DRISDOL) 1.25 MG (50000 UT) capsule Take 1 capsule (50,000 Units total) by mouth once a week. 12 capsule 3   ondansetron (ZOFRAN-ODT) 4 MG disintegrating tablet Take 1 tablet (4 mg total) by mouth every 8 (eight) hours as needed for nausea or vomiting. 20 tablet 0   sodium chloride (OCEAN) 0.65 % SOLN nasal spray Place 1 spray into both nostrils as needed (dryness).     ALPRAZolam (XANAX) 0.5 MG tablet TAKE 1 TABLET (0.5 MG TOTAL) BY MOUTH AT BEDTIME AS NEEDED FOR ANXIETY OR SLEEP. 30 tablet 2   estradiol (ESTRACE) 0.1 MG/GM vaginal cream 1 gram vaginally twice weekly 42.5 g 1   hydrocortisone (ANUSOL-HC) 2.5 % rectal cream Place 1 Application rectally 4 (four) times daily. 30 g 1   losartan-hydrochlorothiazide (HYZAAR) 50-12.5 MG tablet Take 1 tablet by mouth daily.     pantoprazole (PROTONIX) 40 MG tablet Take 40 mg by mouth daily.     rosuvastatin (CRESTOR) 20 MG tablet TAKE 1 TABLET EVERY OTHER  DAY 45 tablet 3   Vibegron (GEMTESA) 75 MG TABS Take 1 tablet (75 mg total) by mouth daily. (Patient not taking: Reported on 06/03/2023) 42 tablet 0   No facility-administered medications prior to visit.    Review of Systems;  Patient denies headache, fevers, malaise, unintentional weight loss, skin rash, eye pain, sinus congestion and sinus pain, sore throat, dysphagia,  hemoptysis , cough, dyspnea, wheezing, chest pain, palpitations, orthopnea, edema, abdominal pain, nausea, melena, diarrhea, constipation, flank pain,  dysuria, hematuria, urinary  Frequency, nocturia, numbness, tingling, seizures,  Focal weakness, Loss of consciousness,  Tremor, insomnia, depression, anxiety, and suicidal ideation.      Objective:  BP 136/74   Pulse 90   Ht 5\' 8"  (1.727 m)   Wt 190 lb 6.4 oz (86.4 kg)   SpO2 97%   BMI 28.95 kg/m   BP Readings from Last 3 Encounters:  06/03/23 136/74  04/12/23 130/70  03/19/23 122/79    Wt Readings from Last 3 Encounters:  06/03/23 190 lb 6.4 oz (86.4 kg)  04/12/23 202 lb 13.2 oz (92 kg)  12/18/22 202 lb 12.8 oz (92 kg)    Physical Exam Vitals reviewed.  Constitutional:      General: She is not in acute distress.    Appearance: Normal appearance. She is normal weight. She is not ill-appearing, toxic-appearing or diaphoretic.  HENT:     Head: Normocephalic.  Eyes:     General: No scleral icterus.       Right eye: No discharge.        Left eye: No discharge.     Conjunctiva/sclera: Conjunctivae normal.  Cardiovascular:     Rate and Rhythm: Normal rate and regular rhythm.     Heart sounds: Normal heart sounds.  Pulmonary:     Effort: Pulmonary effort is normal. No respiratory distress.     Breath sounds: Normal breath sounds.  Musculoskeletal:        General: Normal range of motion.  Skin:    General: Skin is warm and dry.  Neurological:     General: No focal deficit present.     Mental Status: She is alert and oriented to person, place, and time. Mental status is at baseline.  Psychiatric:        Mood and Affect: Mood normal.        Behavior: Behavior normal.        Thought Content: Thought content normal.        Judgment: Judgment normal.    Lab Results  Component Value Date   HGBA1C 6.2 (H) 06/26/2022   HGBA1C 6.3 12/24/2021    Lab Results  Component Value Date   CREATININE 0.83 06/04/2023   CREATININE 0.74 04/12/2023   CREATININE 1.07 (H) 11/05/2022    Lab Results  Component Value Date   WBC 7.0 06/04/2023   HGB 10.8 (L) 06/04/2023   HCT  32.8 (L) 06/04/2023   PLT 460.0 (H) 06/04/2023   GLUCOSE 127 (H) 06/04/2023   CHOL 258 (H) 06/26/2022   TRIG 161 (H) 06/26/2022   HDL 59 06/26/2022   LDLDIRECT 162 (H) 06/26/2022   LDLCALC 170 (H) 06/26/2022   ALT 12 06/04/2023   AST 14 06/04/2023   NA 141 06/04/2023   K 3.7 06/04/2023   CL 103 06/04/2023   CREATININE 0.83 06/04/2023   BUN 15 06/04/2023   CO2 28 06/04/2023   TSH 1.41 12/24/2021   INR 1.03 12/10/2012   HGBA1C 6.2 (H) 06/26/2022   MICROALBUR  4.2 (H) 09/19/2020    No results found.  Assessment & Plan:  .Hospital discharge follow-up Assessment & Plan: Patient is stable post discharge ; all  new issues and questions about discharge plans were discussed at the visit today for hospital follow up. All labs , imaging studies and progress notes from admission were reviewed with patient today   CBC needed to investiate new onset rectal bleeding in the setting of chronic anticoagulation for SMV thrombus and colon CA    Atrophic vaginitis -     Estradiol; 1 gram vaginally twice weekly  Dispense: 42.5 g; Refill: 1  B12 deficiency Assessment & Plan: Repeat level  is < 300;  will resume parenteral injections  or  use sublingual SL B12  Orders: -     B12 and Folate Panel; Future -     Vitamin B12; Future -     Intrinsic Factor Antibodies; Future  Iron deficiency anemia, unspecified iron deficiency anemia type Assessment & Plan: Checking stores and hgb in the setting of chronic anticoagulation, recent hemicolectomy,  and continued rectal bleeding  Lab Results  Component Value Date   IRON 27 (L) 06/04/2023   TIBC 312 06/04/2023   FERRITIN 30 06/04/2023   Lab Results  Component Value Date   WBC 7.0 06/04/2023   HGB 10.8 (L) 06/04/2023   HCT 32.8 (L) 06/04/2023   MCV 92.5 06/04/2023   PLT 460.0 (H) 06/04/2023     Orders: -     Iron, TIBC and Ferritin Panel; Future -     CBC with Differential/Platelet; Future -     Iron, TIBC and Ferritin Panel; Future -      CBC with Differential/Platelet; Future  Essential hypertension -     Losartan Potassium-HCTZ; Take 1 tablet by mouth daily.  Dispense: 90 tablet; Refill: 1 -     Comprehensive metabolic panel; Future  Thrombosis of mesenteric vein (HCC) Assessment & Plan: Occurred  in the post operative setting following a right hemicolectomy for adencarcinoma.  [facility] heme/onc consult done in house; because of her other medical comorbidities she has been advised to continue lovenox sq injections bid x 6 months   Orders: -     AMB Referral [facility] Care Management  Chronic anticoagulation -     AMB Referral [facility] Care Management  Exercise hypoxemia Assessment & Plan: Patient was ambulated during her visit today and had no evidence of hypoxemia    Adenocarcinoma of colon [facility]) Assessment & Plan: Diagnosed after August 20224 colonoscopy.  Ascending colon.  S/p hemicolectomy Jan 2025 at [facility]    Insomnia due to anxiety and fear Assessment & Plan: Inadequately managed with chronic dose of alprazolam 0.5 mg at bedtime due to early waking .   Changing to alprazolam XR  0.5 mg    Other orders -     Hydrocortisone (Perianal); Place 1 Application rectally 4 (four) times daily.  Dispense: 30 g; Refill: 1 -     Pantoprazole Sodium; Take 1 tablet (40 mg total) by mouth daily.  Dispense: 90 tablet; Refill: 1 -     Rosuvastatin Calcium; Take 1 tablet (20 mg total) by mouth every other day.  Dispense: 45 tablet; Refill: 3 -     ALPRAZolam ER; Take 1 tablet (0.5 mg total) by mouth daily. For insomnia  Dispense: 30 tablet; Refill: 2 -     Vitamin B-12; Take 1 tablet (1,000 mcg total) by mouth daily.  Dispense: 90 tablet; Refill: 1 -  Iron (Ferrous Sulfate); Take 325 mg by mouth daily. With food  Dispense: 90 tablet; Refill: 0     Follow-up: No follow-ups on file.   Sherlene Shams, MD

## 2023-06-03 NOTE — Assessment & Plan Note (Signed)
 Patient was ambulated during her visit today and had no evidence of hypoxemia

## 2023-06-04 ENCOUNTER — Other Ambulatory Visit (INDEPENDENT_AMBULATORY_CARE_PROVIDER_SITE_OTHER)

## 2023-06-04 ENCOUNTER — Telehealth: Payer: Self-pay

## 2023-06-04 DIAGNOSIS — D509 Iron deficiency anemia, unspecified: Secondary | ICD-10-CM

## 2023-06-04 DIAGNOSIS — I10 Essential (primary) hypertension: Secondary | ICD-10-CM | POA: Diagnosis not present

## 2023-06-04 DIAGNOSIS — K55069 Acute infarction of intestine, part and extent unspecified: Secondary | ICD-10-CM | POA: Insufficient documentation

## 2023-06-04 DIAGNOSIS — E538 Deficiency of other specified B group vitamins: Secondary | ICD-10-CM

## 2023-06-04 NOTE — Progress Notes (Signed)
 Care Guide Pharmacy Note  06/04/2023 Name: Anna [name] MRN: [id number] DOB: [birth year]  Referred By: Sherlene Shams, MD Reason for referral: Care Coordination (Outreach to schedule with Pharm d )   Anna [name] is a 78 y.o. year old female who is a primary care patient of Darrick Huntsman, Mar Daring, MD.  Anna [name] was referred to the pharmacist for assistance related to: Thrombosis of mesenteric vein   Successful contact was made with the patient to discuss pharmacy services including being ready for the pharmacist to call at least 5 minutes before the scheduled appointment time and to have medication bottles and any blood pressure readings ready for review. The patient agreed to meet with the pharmacist via telephone visit on (date/time).06/09/2023  Penne Lash , RMA     [place]  [facility] Guide  Direct Dial: [phone]  Website: [email or web address]

## 2023-06-04 NOTE — Assessment & Plan Note (Signed)
 Diagnosed after August 20224 colonoscopy.  Ascending colon.  S/p hemicolectomy Jan 2025 at [facility]

## 2023-06-04 NOTE — Assessment & Plan Note (Signed)
 Occurred  in the post operative setting following a right hemicolectomy for adencarcinoma.  [facility] heme/onc consult done in house; because of her other medical comorbidities she has been advised to continue lovenox sq injections bid x 6 months

## 2023-06-04 NOTE — Assessment & Plan Note (Signed)
 Patient is stable post discharge ; all  new issues and questions about discharge plans were discussed at the visit today for hospital follow up. All labs , imaging studies and progress notes from admission were reviewed with patient today   CBC needed to investiate new onset rectal bleeding in the setting of chronic anticoagulation for SMV thrombus and colon CA

## 2023-06-04 NOTE — Assessment & Plan Note (Signed)
 Checking stores and hgb in the setting of chronic anticoagulation, recent hemicolectomy,  and continued rectal bleeding  Lab Results  Component Value Date   IRON 27 (L) 06/04/2023   TIBC 312 06/04/2023   FERRITIN 30 06/04/2023   Lab Results  Component Value Date   WBC 7.0 06/04/2023   HGB 10.8 (L) 06/04/2023   HCT 32.8 (L) 06/04/2023   MCV 92.5 06/04/2023   PLT 460.0 (H) 06/04/2023

## 2023-06-04 NOTE — Assessment & Plan Note (Signed)
 Repeat level needed;  will resume parenteral injections if low; otherwise she has the option of SL B12

## 2023-06-04 NOTE — Assessment & Plan Note (Signed)
 Inadequately managed with chronic dose of alprazolam 0.5 mg at bedtime due to early waking .   Changing to alprazolam XR  0.5 mg

## 2023-06-05 ENCOUNTER — Encounter: Payer: Self-pay | Admitting: Internal Medicine

## 2023-06-05 LAB — CBC WITH DIFFERENTIAL/PLATELET
Basophils Absolute: 0.1 10*3/uL (ref 0.0–0.1)
Basophils Relative: 1.3 % (ref 0.0–3.0)
Eosinophils Absolute: 0.2 10*3/uL (ref 0.0–0.7)
Eosinophils Relative: 3.2 % (ref 0.0–5.0)
HCT: 32.8 % — ABNORMAL LOW (ref 36.0–46.0)
Hemoglobin: 10.8 g/dL — ABNORMAL LOW (ref 12.0–15.0)
Lymphocytes Relative: 44.9 % (ref 12.0–46.0)
Lymphs Abs: 3.2 10*3/uL (ref 0.7–4.0)
MCHC: 32.8 g/dL (ref 30.0–36.0)
MCV: 92.5 fl (ref 78.0–100.0)
Monocytes Absolute: 0.4 10*3/uL (ref 0.1–1.0)
Monocytes Relative: 5.7 % (ref 3.0–12.0)
Neutro Abs: 3.1 10*3/uL (ref 1.4–7.7)
Neutrophils Relative %: 44.9 % (ref 43.0–77.0)
Platelets: 460 10*3/uL — ABNORMAL HIGH (ref 150.0–400.0)
RBC: 3.55 Mil/uL — ABNORMAL LOW (ref 3.87–5.11)
RDW: 14.7 % (ref 11.5–15.5)
WBC: 7 10*3/uL (ref 4.0–10.5)

## 2023-06-05 LAB — B12 AND FOLATE PANEL
Folate: 8 ng/mL (ref >5.9)
Vitamin B-12: 276 pg/mL (ref 211–911)

## 2023-06-05 LAB — COMPREHENSIVE METABOLIC PANEL
ALT: 12 U/L (ref 0–35)
AST: 14 U/L (ref 0–37)
Albumin: 3.9 g/dL (ref 3.5–5.2)
Alkaline Phosphatase: 62 U/L (ref 39–117)
BUN: 15 mg/dL (ref 6–23)
CO2: 28 meq/L (ref 19–32)
Calcium: 9.3 mg/dL (ref 8.4–10.5)
Chloride: 103 meq/L (ref 96–112)
Creatinine, Ser: 0.83 mg/dL (ref 0.40–1.20)
GFR: 68.52 mL/min (ref >60.00)
Glucose, Bld: 127 mg/dL — ABNORMAL HIGH (ref 70–99)
Potassium: 3.7 meq/L (ref 3.5–5.1)
Sodium: 141 meq/L (ref 135–145)
Total Bilirubin: 0.2 mg/dL (ref 0.2–1.2)
Total Protein: 7.4 g/dL (ref 6.0–8.3)

## 2023-06-05 LAB — IRON,TIBC AND FERRITIN PANEL
%SAT: 9 % — ABNORMAL LOW (ref 16–45)
Ferritin: 30 ng/mL (ref 16–288)
Iron: 27 ug/dL — ABNORMAL LOW (ref 45–160)
TIBC: 312 ug/dL (ref 250–450)

## 2023-06-05 MED ORDER — IRON (FERROUS SULFATE) 325 (65 FE) MG PO TABS: 325.0000 mg | ORAL_TABLET | Freq: Every day | ORAL | 0 refills | Status: DC

## 2023-06-05 MED ORDER — VITAMIN B-12 1000 MCG PO TABS: 1000.0000 ug | ORAL_TABLET | Freq: Every day | ORAL | 1 refills | Status: DC

## 2023-06-05 NOTE — Addendum Note (Signed)
 Addended by: Sherlene Shams on: 06/05/2023 01:54 PM   Modules accepted: Orders

## 2023-06-09 ENCOUNTER — Other Ambulatory Visit: Admitting: Pharmacist

## 2023-06-09 DIAGNOSIS — K55069 Acute infarction of intestine, part and extent unspecified: Secondary | ICD-10-CM

## 2023-06-09 NOTE — Patient Instructions (Addendum)
 Ms. Anna [name],   It was a pleasure to speak with you today! As we discussed:?   Option 1: GoodRx estimates $211/month (60 syringes) at [facility] You may call [facility] to confirm, and request that they [facility].   Option 2: Through [employer] prescription deductible ($590 total in 2025) After deductible, coinsurance is 38% of the drug cost (Tier 4 medication) If $2000 total spending is reached, all medications should be free for remainder of the year (catastrophic coverage phase)  Option 3: Grants open/close frequently. Consider joining wait list to be notified of changes  [email or web address] PAN Foundation [employer]  We also discussed reaching out to the [facility] team. They likely encounter cost savings for Lovenox frequently and may have additional resources or specific [employer] available.   You may respond directly to this message, or leave me a voicemail at [phone] and I will get back to you shortly.   Thank you!   Future Appointments  Date Time Provider Department Center  07/29/2023  1:30 PM Carman Ching, PA-C [facility] None  09/03/2023 10:10 AM [facility] ANNUAL WELLNESS VISIT [facility]   Loree Fee, PharmD Clinical Pharmacist [facility] [phone]

## 2023-06-09 NOTE — Progress Notes (Signed)
   06/09/2023 Name: Anna [name] MRN: [id number] DOB: February 11, 1946  Subjective  No chief complaint on file.   Care Team: Primary Care Provider: Sherlene Shams, MD Hem[facility]c [facility]: Archie Endo, MD  GI [facility] Juleen Starr: Sharyn Lull, MD  Reason for visit: ?  Anna [name] is a 78 y.o. female who presents today for a telephone visit with the pharmacist due to medication access concerns regarding their Lovenox. ?  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 She was admitted treatment of SMV thrombus. Discharged on Lovenox 90 mg q12h.   Through insurance low $400 - $27 days [facility] Claim $160   GoodRx:  PAP >60,000 annually  Medication Access: ?  Prescription drug coverage: Payor: BLUE CROSS BLUE SHIELD / Plan: BCBS SUPPLEMENT / Product Type: *No Product type* / .   Reports that all medications are not affordable.  Lovenox: $400/month (Lovenox 90 mg q12h x6 months managed by Heme-Onc team. Thrombus on ppx dosing of Lovenox)  Prescription Drug Coverage: WellCare PDP Wellcare Medicare Rx Value Plus (PDP) Rx Deductible (Tier 3-5 meds): $590 Tier 1: $0, Tier 2: $5, Tier 4: 38% coinsurance (non-preferred medication)    - Enoxaparin: Tier 4 (non-preferred)    - Lovenox: Not covered (Non-formulary)  Current Patient Assistance:  N/A Patient lives in a household of 1 with an estimated combined annual income of greater than 400% FPL  via wages. Currently working as a Veterinary surgeon.   Medicare LIS Eligible: No -  Income limit exceeded   Assessment and Plan:   1. Medication Access Good Rx price $363.33 ([facility]) GoodRx price may be cheaper through different pharmacy - $211 ([facility]) - patient reports this cost is feasible for her for the intended 6 months of treatment.   Also recommended connecting with [facility] team, as they likely have a patient assistance team who may know of additional options at [facility].  Lovenox is available through the [employer] PAP with income limit <400% FPL (pt income exceeds) [facility] Patient Assistance Program: Free medication for those <200% FPL (pt income exceeds) HealthWell Foundation: No open grants at this time [employer]: No open grants at this time  Option 1: GoodRx $211/month at [employer] Option 2: Insurance deductible ($590 total) then coinsurance of 38% drug cost. If $2000 is reached, all medications free for remainder of the year (catastrophic coverage phase) Option 3: Grants open/close frequently. Consider joining wait list to be notified of changes  [email or web address] PAN Foundation [employer]  Future Appointments  Date Time Provider Department Center  06/09/2023  9:00 AM [facility] C[facility] PHARMACIST [facility]  07/29/2023  1:30 PM Vaillancourt, Lelon Mast, PA-C [facility] None  09/03/2023 10:10 AM [facility] ANNUAL WELLNESS VISIT [facility]    Loree Fee, PharmD Clinical Pharmacist [facility] [phone]

## 2023-06-11 ENCOUNTER — Ambulatory Visit (INDEPENDENT_AMBULATORY_CARE_PROVIDER_SITE_OTHER)

## 2023-06-11 ENCOUNTER — Encounter: Payer: Self-pay | Admitting: Internal Medicine

## 2023-06-11 DIAGNOSIS — E538 Deficiency of other specified B group vitamins: Secondary | ICD-10-CM | POA: Diagnosis not present

## 2023-06-11 MED ORDER — CYANOCOBALAMIN 1000 MCG/ML IJ SOLN
1000.0000 ug | Freq: Once | INTRAMUSCULAR | Status: AC
  Administered 2023-06-11: 1000 ug via INTRAMUSCULAR

## 2023-06-11 NOTE — Progress Notes (Signed)
 Patient presented for B 12 injection to right deltoid, patient voiced no concerns nor showed any signs of distress during injection.

## 2023-06-17 ENCOUNTER — Telehealth: Payer: Self-pay

## 2023-06-17 ENCOUNTER — Other Ambulatory Visit (HOSPITAL_COMMUNITY): Payer: Self-pay

## 2023-06-17 NOTE — Telephone Encounter (Signed)
 Pharmacy Patient Advocate Encounter   Received notification from Pt Calls Messages that prior authorization for Alprazolam ER 0.5mg  is required/requested.   Insurance verification completed.   The patient is insured through [employer] .   Per test claim: The current 30 day co-pay is, $8.49.  No PA needed at this time. This test claim was processed through [facility]- copay amounts may vary at other pharmacies due to pharmacy/plan contracts, or as the patient moves through the different stages of their insurance plan.

## 2023-06-17 NOTE — Telephone Encounter (Signed)
 Left message for patient to give our office a call back to discuss PA recommendation.  OK to relay message if patient calls back. If relayed, please notify the office.

## 2023-06-17 NOTE — Telephone Encounter (Signed)
 Pt is aware and gave a verbal understanding.

## 2023-06-17 NOTE — Telephone Encounter (Signed)
 I have sent a message to the prior authorization team for them to submit a PA for the Alprazolam.

## 2023-06-17 NOTE — Telephone Encounter (Signed)
 PA for Alprazolam is needed

## 2023-06-17 NOTE — Telephone Encounter (Signed)
 Please see previous telephone encounter pt is aware.

## 2023-06-18 ENCOUNTER — Ambulatory Visit

## 2023-06-18 ENCOUNTER — Other Ambulatory Visit: Payer: Self-pay | Admitting: Internal Medicine

## 2023-06-18 DIAGNOSIS — E538 Deficiency of other specified B group vitamins: Secondary | ICD-10-CM

## 2023-06-18 MED ORDER — CYANOCOBALAMIN 1000 MCG/ML IJ SOLN
1000.0000 ug | Freq: Once | INTRAMUSCULAR | Status: AC
  Administered 2023-06-18: 1000 ug via INTRAMUSCULAR

## 2023-06-18 MED ORDER — ALPRAZOLAM ER 0.5 MG PO TB24: 0.5000 mg | ORAL_TABLET | Freq: Every evening | ORAL | 2 refills | Status: DC | PRN

## 2023-06-18 NOTE — Progress Notes (Signed)
 Patient presented for B 12 injection to left deltoid, patient voiced no concerns nor showed any signs of distress during injection.

## 2023-06-25 ENCOUNTER — Ambulatory Visit

## 2023-06-25 DIAGNOSIS — E538 Deficiency of other specified B group vitamins: Secondary | ICD-10-CM | POA: Diagnosis not present

## 2023-06-25 MED ORDER — CYANOCOBALAMIN 1000 MCG/ML IJ SOLN
1000.0000 ug | Freq: Once | INTRAMUSCULAR | Status: AC
  Administered 2023-06-25: 1000 ug via INTRAMUSCULAR

## 2023-06-25 NOTE — Progress Notes (Signed)
 Patient presented for B 12 injection to left deltoid, patient voiced no concerns nor showed any signs of distress during injection.

## 2023-06-27 MED ORDER — ALPRAZOLAM ER 0.5 MG PO TB24: ORAL_TABLET | ORAL | 2 refills | Status: DC

## 2023-07-02 ENCOUNTER — Ambulatory Visit (INDEPENDENT_AMBULATORY_CARE_PROVIDER_SITE_OTHER)

## 2023-07-02 DIAGNOSIS — E538 Deficiency of other specified B group vitamins: Secondary | ICD-10-CM | POA: Diagnosis not present

## 2023-07-02 MED ORDER — CYANOCOBALAMIN 1000 MCG/ML IJ SOLN
1000.0000 ug | Freq: Once | INTRAMUSCULAR | Status: AC
Start: 2023-07-02 — End: 2023-07-02
  Administered 2023-07-02: 1000 ug via INTRAMUSCULAR

## 2023-07-02 NOTE — Progress Notes (Signed)
 Patient presented for B 12 injection to right deltoid, patient voiced no concerns nor showed any signs of distress during injection.

## 2023-07-15 ENCOUNTER — Other Ambulatory Visit

## 2023-07-28 ENCOUNTER — Ambulatory Visit

## 2023-07-29 ENCOUNTER — Ambulatory Visit (INDEPENDENT_AMBULATORY_CARE_PROVIDER_SITE_OTHER)

## 2023-07-29 ENCOUNTER — Telehealth: Payer: Self-pay

## 2023-07-29 ENCOUNTER — Other Ambulatory Visit: Payer: Self-pay | Admitting: Physician Assistant

## 2023-07-29 ENCOUNTER — Ambulatory Visit (INDEPENDENT_AMBULATORY_CARE_PROVIDER_SITE_OTHER): Payer: Self-pay | Admitting: Physician Assistant

## 2023-07-29 ENCOUNTER — Other Ambulatory Visit

## 2023-07-29 VITALS — BP 129/79 | HR 81 | Ht 68.0 in | Wt 187.0 lb

## 2023-07-29 DIAGNOSIS — E782 Mixed hyperlipidemia: Secondary | ICD-10-CM

## 2023-07-29 DIAGNOSIS — D509 Iron deficiency anemia, unspecified: Secondary | ICD-10-CM | POA: Diagnosis not present

## 2023-07-29 DIAGNOSIS — E538 Deficiency of other specified B group vitamins: Secondary | ICD-10-CM | POA: Diagnosis not present

## 2023-07-29 DIAGNOSIS — N3281 Overactive bladder: Secondary | ICD-10-CM

## 2023-07-29 DIAGNOSIS — N390 Urinary tract infection, site not specified: Secondary | ICD-10-CM | POA: Diagnosis not present

## 2023-07-29 DIAGNOSIS — R351 Nocturia: Secondary | ICD-10-CM

## 2023-07-29 DIAGNOSIS — N393 Stress incontinence (female) (male): Secondary | ICD-10-CM

## 2023-07-29 LAB — URINALYSIS, COMPLETE
Bilirubin, UA: NEGATIVE
Glucose, UA: NEGATIVE
Ketones, UA: NEGATIVE
Nitrite, UA: POSITIVE — AB
Protein,UA: NEGATIVE
RBC, UA: NEGATIVE
Specific Gravity, UA: 1.015 (ref 1.005–1.030)
Urobilinogen, Ur: 0.2 mg/dL (ref 0.2–1.0)
pH, UA: 5.5 (ref 5.0–7.5)

## 2023-07-29 LAB — MICROSCOPIC EXAMINATION: WBC, UA: >30 /HPF — AB (ref 0–5)

## 2023-07-29 MED ORDER — CYANOCOBALAMIN 1000 MCG/ML IJ SOLN
1000.0000 ug | Freq: Once | INTRAMUSCULAR | Status: AC
  Administered 2023-07-29: 1000 ug via INTRAMUSCULAR

## 2023-07-29 MED ORDER — GEMTESA 75 MG PO TABS
75.0000 mg | ORAL_TABLET | Freq: Every day | ORAL | 11 refills | Status: DC
Start: 2023-07-29 — End: 2023-07-30

## 2023-07-29 MED ORDER — ESTRADIOL 0.1 MG/GM VA CREA: TOPICAL_CREAM | VAGINAL | 3 refills | Status: DC

## 2023-07-29 MED ORDER — NITROFURANTOIN MONOHYD MACRO 100 MG PO CAPS: 100.0000 mg | ORAL_CAPSULE | Freq: Two times a day (BID) | ORAL | 0 refills | Status: AC

## 2023-07-29 NOTE — Progress Notes (Signed)
 In and Out Catheterization  Patient is present today for a I & O catheterization due to urinary retention . Patient was cleaned and prepped in a sterile fashion with betadine . A 14FR cath was inserted no complications were noted , of urine return was noted, urine was yellow in color. A clean urine sample was collected for urina sample. Bladder was drained  And catheter was removed with out difficulty.    Performed by: Theodosia Fishman CMA

## 2023-07-29 NOTE — Telephone Encounter (Signed)
 Lab has been added for lab appt per pt request.

## 2023-07-29 NOTE — Progress Notes (Signed)
 Patient presented for B 12 injection to left deltoid, patient voiced no concerns nor showed any signs of distress during injection.

## 2023-07-30 LAB — CBC WITH DIFFERENTIAL/PLATELET
Basophils Absolute: 0.1 10*3/uL (ref 0.0–0.1)
Basophils Relative: 1 % (ref 0.0–3.0)
Eosinophils Absolute: 0.2 10*3/uL (ref 0.0–0.7)
Eosinophils Relative: 2 % (ref 0.0–5.0)
HCT: 35.2 % — ABNORMAL LOW (ref 36.0–46.0)
Hemoglobin: 11.4 g/dL — ABNORMAL LOW (ref 12.0–15.0)
Lymphocytes Relative: 35.2 % (ref 12.0–46.0)
Lymphs Abs: 2.8 10*3/uL (ref 0.7–4.0)
MCHC: 32.4 g/dL (ref 30.0–36.0)
MCV: 89.7 fl (ref 78.0–100.0)
Monocytes Absolute: 0.6 10*3/uL (ref 0.1–1.0)
Monocytes Relative: 7.1 % (ref 3.0–12.0)
Neutro Abs: 4.4 10*3/uL (ref 1.4–7.7)
Neutrophils Relative %: 54.7 % (ref 43.0–77.0)
Platelets: 362 10*3/uL (ref 150.0–400.0)
RBC: 3.92 Mil/uL (ref 3.87–5.11)
RDW: 15.9 % — ABNORMAL HIGH (ref 11.5–15.5)
WBC: 8.1 10*3/uL (ref 4.0–10.5)

## 2023-07-30 LAB — LIPID PANEL
Cholesterol: 231 mg/dL — ABNORMAL HIGH (ref 0–200)
HDL: 51.7 mg/dL (ref >39.00)
LDL Cholesterol: 144 mg/dL — ABNORMAL HIGH (ref 0–99)
NonHDL: 179.53
Total CHOL/HDL Ratio: 4
Triglycerides: 178 mg/dL — ABNORMAL HIGH (ref 0.0–149.0)
VLDL: 35.6 mg/dL (ref 0.0–40.0)

## 2023-07-30 LAB — VITAMIN B12: Vitamin B-12: 383 pg/mL (ref 211–911)

## 2023-07-30 LAB — LDL CHOLESTEROL, DIRECT: Direct LDL: 161 mg/dL

## 2023-07-30 NOTE — Progress Notes (Signed)
 07/29/2023 1:07 PM   Anna [name] [birth year] [id number]  CC: Chief Complaint  Patient presents with   Other   HPI: Anna [name] is a 78 y.o. 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   Today she reports decreasing frequency of UTIs over the past year, however she has had about 7 to 10 days of low back pain and dysuria and is currently concerned for infection.  She describes chronic nocturia x 5-6 without daytime frequency.  She has some urgency and stress incontinence but no urge incontinence.  She wears 1-2 pads daily and they are damp.  Gemtesa  helps, but she has run out of it.  Notably, she underwent right hemicolectomy in January for management of colon cancer.  She denies fecaluria or pneumaturia.  In-office UA today positive for nitrites and trace leukocytes; urine microscopy with >30 WBCs/HPF and many bacteria.  PMH: Past Medical History:  Diagnosis Date   Acid reflux    Allergy  to alpha-gal    Anxiety    Bilateral cataracts    Cancer (HCC)    Basal Cell Carcinoma, Melanoma   CIN I (cervical intraepithelial neoplasia I)    Colon polyps    benign   H/O squamous cell carcinoma excision    multiple areas  on body per pt   Hemorrhoid    History of basal cell carcinoma (BCC) excision    History of melanoma excision RIGHT LEG  --  2012   history of SVT (supraventricular tachycardia) (HCC) 10/31/2014   resolved   History of Wolff-Parkinson-White (WPW) syndrome    1996  s/p  ablation   Hypertension    IBS (irritable bowel syndrome)    Lyme disease 2005   Mild carotid artery disease (HCC)    per duplex 12-10-2012  bilateral ICA 1-39% stenosis   OsteoArthritis    Sleep apnea    does not use cpap, going back for cpap titration, mild osa per pt   UTI (urinary tract infection)    finished 7 day course on 08-28-2021 all symptoms  resolved    Surgical History: Past Surgical History:  Procedure Laterality Date   BREAST SURGERY Left 1999   BREAST MASS EXCISED, benign   CARDIAC ELECTROPHYSIOLOGY STUDY AND ABLATION  1996   SUCCESSFUL ABLATION OF WOLFE-PARKINSON-WHITE SYNDROME  (NO ISSUES SINCE)   CATARACT EXTRACTION, BILATERAL     CERVICAL BIOPSY  W/ LOOP ELECTRODE EXCISION  2005   dr gottsegen   COLONOSCOPY     2019 or 2020   COLPOSCOPY     DILATATION & CURETTAGE/HYSTEROSCOPY WITH MYOSURE N/A 09/17/2021   Procedure: DILATATION & CURETTAGE/HYSTEROSCOPY WITH MYOSURE;  Surgeon: Wanita Gutta, MD;  Location: [facility];  Service: Gynecology;  Laterality: N/A;   HYSTEROSCOPY WITH D & C  02/13/2012   Procedure: DILATATION AND CURETTAGE /HYSTEROSCOPY;  Surgeon: Francia Ip, MD;  Location: [facility];  Service: Gynecology;  Laterality: N/A;  deficit - 710   LAPAROSCOPIC CHOLECYSTECTOMY  05-16-2005  dr Afton Horse [facility]   melanoma excised     x  3 , 2 on legs and 1 on arm, "not the kind  that spreads"   SVT-CARDIAC ABLATION     TRANSTHORACIC ECHOCARDIOGRAM  12-10-2012   dr Anastasia Balo   mild LVH, ef 55-60%/  mild MR/  mild LAE and RAE   TUBAL LIGATION  UPPER GI ENDOSCOPY     VULVECTOMY N/A 07/25/2017   Procedure: WIDE EXCISION VULVECTOMY;  Surgeon: Valeen Gartner, MD;  Location: [facility];  Service: Gynecology;  Laterality: N/A;    Home Medications:  Allergies as of 07/29/2023       Reactions   Iodinated Contrast Media Hypertension   Patient experienced elevated heart rate, hypertension and had to be taken to the ED   Alpha-gal    facial redness, upset stomach   Gluten Meal Dermatitis   Bloating    Latex Other (See Comments)   Milk-related Compounds    facial flushing,  upset stomach, diarrhea   Onion    Other reaction(s): Not available   Shellfish Allergy  Hives   Sulfa Antibiotics Hives, Nausea Only, Other (See Comments)   Trazodone     Severe  sleepiness   Azithromycin Diarrhea, Palpitations, Other (See Comments)   Cramping    Shellfish-derived Products    Other Reaction(s): abdominal pain   Wheat Rash   Stomach pain        Medication List        Accurate as of July 29, 2023 11:59 PM. If you have any questions, ask your nurse or doctor.          STOP taking these medications    cyanocobalamin  1000 MCG tablet Commonly known as: VITAMIN B12 Stopped by: Chasten Blaze   enoxaparin  100 MG/ML injection Commonly known as: LOVENOX  Stopped by: Kathreen Pare   Gemtesa  75 MG Tabs Generic drug: Vibegron  Replaced by: Trospium Chloride 60 MG Cp24 Stopped by: Brinlee Gambrell       TAKE these medications    acetaminophen  500 MG tablet Commonly known as: TYLENOL  Take 1,000 mg by mouth every 6 (six) hours as needed for moderate pain.   albuterol  108 (90 Base) MCG/ACT inhaler Commonly known as: VENTOLIN  HFA Inhale 1-2 puffs into the lungs every 4 (four) hours as needed for wheezing or shortness of breath.   ALPRAZolam  0.5 MG 24 hr tablet Commonly known as: ALPRAZolam  XR 1-2 TABLETS DAILY AS NEEDED FOR SLEEP   diphenhydrAMINE  12.5 MG/5ML liquid Commonly known as: BENADRYL  Take 37.5 mg by mouth 4 (four) times daily as needed for allergies.   EPINEPHrine  0.3 mg/0.3 mL Soaj injection Commonly known as: EPI-PEN Use as directed for severe allergic reaction   ergocalciferol  1.25 MG (50000 UT) capsule Commonly known as: Drisdol  Take 1 capsule (50,000 Units total) by mouth once a week.   estradiol  0.1 MG/GM vaginal cream Commonly known as: ESTRACE  Apply a pea-sized amount around the opening of the urethra 3 times weekly. What changed: additional instructions Changed by: Natalea Sutliff   hydrocortisone  2.5 % rectal cream Commonly known as: ANUSOL -HC Place 1 Application rectally 4 (four) times daily.   Iron  (Ferrous Sulfate ) 325 (65 Fe) MG Tabs Take 325 mg by mouth daily. With  food   losartan -hydrochlorothiazide 50-12.5 MG tablet Commonly known as: HYZAAR Take 1 tablet by mouth daily.   nitrofurantoin  (macrocrystal-monohydrate) 100 MG capsule Commonly known as: MACROBID  Take 1 capsule (100 mg total) by mouth every 12 (twelve) hours for 5 days. Started by: Kathreen Pare   ondansetron  4 MG disintegrating tablet Commonly known as: ZOFRAN -ODT Take 1 tablet (4 mg total) by mouth every 8 (eight) hours as needed for nausea or vomiting.   pantoprazole  40 MG tablet Commonly known as: PROTONIX  Take 1 tablet (40 mg total) by mouth daily.   rosuvastatin  20 MG tablet Commonly known as: CRESTOR  Take 1 tablet (20  mg total) by mouth every other day.   sodium chloride  0.65 % Soln nasal spray Commonly known as: OCEAN Place 1 spray into both nostrils as needed (dryness).   Trospium Chloride 60 MG Cp24 Take 1 capsule (60 mg total) by mouth daily. Replaces: Gemtesa  75 MG Tabs Started by: Kathreen Pare        Allergies:  Allergies  Allergen Reactions   Iodinated Contrast Media Hypertension    Patient experienced elevated heart rate, hypertension and had to be taken to the ED   Alpha-Gal     facial redness, upset stomach   Gluten Meal Dermatitis    Bloating    Latex Other (See Comments)   Milk-Related Compounds     facial flushing,  upset stomach, diarrhea   Onion     Other reaction(s): Not available   Shellfish Allergy  Hives   Sulfa Antibiotics Hives, Nausea Only and Other (See Comments)   Trazodone      Severe sleepiness   Azithromycin Diarrhea, Palpitations and Other (See Comments)    Cramping    Shellfish-Derived Products     Other Reaction(s): abdominal pain   Wheat Rash    Stomach pain    Family History: Family History  Problem Relation Age of Onset   Cancer Mother        ESOPHAGEAL CANCER   Diabetes Father    Hyperlipidemia Father    Heart disease Father    Heart attack Father    Heart disease Brother    Breast cancer  Paternal Aunt        Age 6's   Hyperlipidemia Paternal Grandmother    Heart disease Paternal Grandmother    Stroke Paternal Grandmother    Diabetes Paternal Grandfather    Hyperlipidemia Paternal Grandfather    Heart disease Paternal Grandfather    Stroke Paternal Grandfather    Cancer Sister        skin   Stroke Maternal Grandmother    Stroke Maternal Grandfather    Diabetes Brother     Social History:   reports that she has never smoked. She has never used smokeless tobacco. She reports that she does not currently use alcohol. She reports that she does not use drugs.  Physical Exam: BP 129/79   Pulse 81   Ht 5\' 8"  (1.727 m)   Wt 187 lb (84.8 kg)   BMI 28.43 kg/m   Constitutional:  Alert and oriented, no acute distress, nontoxic appearing HEENT: [place], AT Cardiovascular: No clubbing, cyanosis, or edema Respiratory: Normal respiratory effort, no increased work of breathing Skin: No rashes, bruises or suspicious lesions Neurologic: Grossly intact, no focal deficits, moving all 4 extremities Psychiatric: Normal mood and affect  Laboratory Data: Results for orders placed or performed in visit on 07/29/23  Microscopic Examination   Collection Time: 07/29/23  1:56 PM   Urine  Result Value Ref Range   WBC, UA >30 (A) 0 - 5 /hpf   RBC, Urine 0-2 0 - 2 /hpf   Epithelial Cells (non renal) 0-10 0 - 10 /hpf   Bacteria, UA Many (A) None seen/Few  Urinalysis, Complete   Collection Time: 07/29/23  1:56 PM  Result Value Ref Range   Specific Gravity, UA 1.015 1.005 - 1.030   pH, UA 5.5 5.0 - 7.5   Color, UA Yellow Yellow   Appearance Ur Cloudy (A) Clear   Leukocytes,UA Trace (A) Negative   Protein,UA Negative Negative/Trace   Glucose, UA Negative Negative   Ketones, UA Negative Negative  RBC, UA Negative Negative   Bilirubin, UA Negative Negative   Urobilinogen, Ur 0.2 0.2 - 1.0 mg/dL   Nitrite, UA Positive (A) Negative   Microscopic Examination See below:    Assessment &  Plan:   1. Nocturia (Primary) Improved on Gemtesa , will continue this.  2. Recurrent UTI UA appears grossly infected today, will start empiric Macrobid  and send for culture for further evaluation.  Continue estrogen cream.  If she develops more frequent UTIs, fecaluria, or pneumaturia, will consider imaging for consideration of possible fistula in light of her recent hemicolectomy and colon cancer. - Urinalysis, Complete - CULTURE, URINE COMPREHENSIVE - nitrofurantoin , macrocrystal-monohydrate, (MACROBID ) 100 MG capsule; Take 1 capsule (100 mg total) by mouth every 12 (twelve) hours for 5 days.  Dispense: 10 capsule; Refill: 0 - estradiol  (ESTRACE ) 0.1 MG/GM vaginal cream; Apply a pea-sized amount around the opening of the urethra 3 times weekly.  Dispense: 42.5 g; Refill: 3  Return in about 1 year (around 07/28/2024) for Annual OAB f/u with PVR.  Kathreen Pare, PA-C  [facility] [place] [address], [address] [place], [place] [address] [phone]

## 2023-07-31 ENCOUNTER — Encounter: Payer: Self-pay | Admitting: Internal Medicine

## 2023-07-31 ENCOUNTER — Other Ambulatory Visit: Payer: Self-pay | Admitting: Internal Medicine

## 2023-07-31 ENCOUNTER — Telehealth: Payer: Self-pay | Admitting: Urology

## 2023-07-31 DIAGNOSIS — D508 Other iron deficiency anemias: Secondary | ICD-10-CM

## 2023-07-31 LAB — IRON,TIBC AND FERRITIN PANEL
%SAT: 15 % — ABNORMAL LOW (ref 16–45)
Ferritin: 10 ng/mL — ABNORMAL LOW (ref 16–288)
Iron: 63 ug/dL (ref 45–160)
TIBC: 414 ug/dL (ref 250–450)

## 2023-07-31 LAB — CULTURE, URINE COMPREHENSIVE

## 2023-07-31 LAB — INTRINSIC FACTOR ANTIBODIES: Intrinsic Factor: NEGATIVE

## 2023-07-31 NOTE — Telephone Encounter (Signed)
 Contacted by overnight nurse call line [id number] UTI symptoms despite culture appropriate nitrofurantoin . Changed to keflex  500mg  BID X 7 days for improved tissue penetration, instructed to present to ED if fevers over 101 or uncontrolled pain  Jay Meth, MD 07/31/2023

## 2023-08-13 ENCOUNTER — Encounter: Payer: Self-pay | Admitting: Internal Medicine

## 2023-08-13 MED ORDER — ALPRAZOLAM 0.5 MG PO TABS: 0.5000 mg | ORAL_TABLET | Freq: Every evening | ORAL | 3 refills | Status: DC | PRN

## 2023-08-14 ENCOUNTER — Ambulatory Visit (INDEPENDENT_AMBULATORY_CARE_PROVIDER_SITE_OTHER): Admitting: Physician Assistant

## 2023-08-14 ENCOUNTER — Other Ambulatory Visit: Payer: Self-pay | Admitting: Physician Assistant

## 2023-08-14 VITALS — BP 160/79 | HR 110

## 2023-08-14 DIAGNOSIS — R3989 Other symptoms and signs involving the genitourinary system: Secondary | ICD-10-CM

## 2023-08-14 DIAGNOSIS — N390 Urinary tract infection, site not specified: Secondary | ICD-10-CM

## 2023-08-14 MED ORDER — FOSFOMYCIN TROMETHAMINE 3 G PO PACK
3.0000 g | PACK | Freq: Once | ORAL | 0 refills | Status: AC
Start: 2023-08-14 — End: 2023-08-14

## 2023-08-14 NOTE — Progress Notes (Unsigned)
 08/14/2023 11:59 AM   Anna [name] 1945/04/30 [id number]  CC: Chief Complaint  Patient presents with   Follow-up   HPI: Anna [name] is a 78 y.o. 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   I saw her in clinic most recently on 07/29/2023 for annual follow-up with reports of possible UTI.  Her UA was notable for pyuria and bacteriuria and I treated her with empiric Macrobid .  Her urine culture grew pansensitive E. coli.  She called back 2 days later to report persistent symptoms despite culture appropriate antibiotics and was switched to Keflex  500 mg twice daily x 7 days.  Today she reports her irritative voiding symptoms improved but did not resolve on Keflex .  She completed it 6 days ago and she has had stable dysuria and lower abdominal pain since.  She denies fever, chills, nausea, or vomiting.  Notably, she reports pneumaturia every time she voids.  She has a history of iodinated contrast allergy , and she was previously hospitalized due to her reaction to it.  In office UA pan negative, urine microscopy with >10 epithelial cells/hpf and moderate bacteria.  Insufficient sample volume for culture.  PMH: Past Medical History:  Diagnosis Date   Acid reflux    Allergy  to alpha-gal    Anxiety    Bilateral cataracts    Cancer (HCC)    Basal Cell Carcinoma, Melanoma   CIN I (cervical intraepithelial neoplasia I)    Colon polyps    benign   H/O squamous cell carcinoma excision    multiple areas  on body per pt   Hemorrhoid    History of basal cell carcinoma (BCC) excision    History of melanoma excision RIGHT LEG  --  2012   history of SVT (supraventricular tachycardia) (HCC) 10/31/2014   resolved   History of Wolff-Parkinson-White (WPW) syndrome    1996   s/p  ablation   Hypertension    IBS (irritable bowel syndrome)    Lyme disease 2005   Mild carotid artery disease (HCC)    per duplex 12-10-2012  bilateral ICA 1-39% stenosis   OsteoArthritis    Sleep apnea    does not use cpap, going back for cpap titration, mild osa per pt   UTI (urinary tract infection)    finished 7 day course on 08-28-2021 all symptoms resolved    Surgical History: Past Surgical History:  Procedure Laterality Date   BREAST SURGERY Left 1999   BREAST MASS EXCISED, benign   CARDIAC ELECTROPHYSIOLOGY STUDY AND ABLATION  1996   SUCCESSFUL ABLATION OF WOLFE-PARKINSON-WHITE SYNDROME  (NO ISSUES SINCE)   CATARACT EXTRACTION, BILATERAL     CERVICAL BIOPSY  W/ LOOP ELECTRODE EXCISION  2005   dr gottsegen   COLONOSCOPY     2019 or 2020   COLPOSCOPY     DILATATION & CURETTAGE/HYSTEROSCOPY WITH MYOSURE N/A 09/17/2021   Procedure: DILATATION & CURETTAGE/HYSTEROSCOPY WITH MYOSURE;  Surgeon: Wanita Gutta, MD;  Location: [facility] [place];  Service: Gynecology;  Laterality: N/A;   HYSTEROSCOPY WITH D & C  02/13/2012   Procedure: DILATATION AND CURETTAGE /HYSTEROSCOPY;  Surgeon: Francia Ip, MD;  Location: [facility] [place];  Service: Gynecology;  Laterality: N/A;  deficit - 710   LAPAROSCOPIC CHOLECYSTECTOMY  05-16-2005  dr Afton Horse [facility]  melanoma excised     x  3 , 2 on legs and 1 on arm, "not the kind  that spreads"   SVT-CARDIAC ABLATION     TRANSTHORACIC ECHOCARDIOGRAM  12-10-2012   dr Anastasia Balo   mild LVH, ef 55-60%/  mild MR/  mild LAE and RAE   TUBAL LIGATION     UPPER GI ENDOSCOPY     VULVECTOMY N/A 07/25/2017   Procedure: WIDE EXCISION VULVECTOMY;  Surgeon: Valeen Gartner, MD;  Location: [facility];  Service: Gynecology;  Laterality: N/A;    Home Medications:  Allergies as of 08/14/2023       Reactions   Iodinated Contrast Media Hypertension   Patient experienced elevated heart rate, hypertension and had  to be taken to the ED   Alpha-gal    facial redness, upset stomach   Gluten Meal Dermatitis   Bloating    Latex Other (See Comments)   Milk-related Compounds    facial flushing,  upset stomach, diarrhea   Shellfish Allergy  Hives   Sulfa Antibiotics Hives, Nausea Only, Other (See Comments)   Trazodone     Severe sleepiness   Azithromycin Diarrhea, Palpitations, Other (See Comments)   Cramping    Shellfish-derived Products    Other Reaction(s): abdominal pain   Wheat Rash   Stomach pain        Medication List        Accurate as of Aug 14, 2023 11:59 AM. If you have any questions, ask your nurse or doctor.          acetaminophen  500 MG tablet Commonly known as: TYLENOL  Take 1,000 mg by mouth every 6 (six) hours as needed for moderate pain.   albuterol  108 (90 Base) MCG/ACT inhaler Commonly known as: VENTOLIN  HFA Inhale 1-2 puffs into the lungs every 4 (four) hours as needed for wheezing or shortness of breath.   ALPRAZolam  0.5 MG tablet Commonly known as: XANAX  Take 1 tablet (0.5 mg total) by mouth at bedtime as needed for anxiety or sleep.   diphenhydrAMINE  12.5 MG/5ML liquid Commonly known as: BENADRYL  Take 37.5 mg by mouth 4 (four) times daily as needed for allergies.   Eliquis 5 MG Tabs tablet Generic drug: apixaban Take 5 mg by mouth 2 (two) times daily.   EPINEPHrine  0.3 mg/0.3 mL Soaj injection Commonly known as: EPI-PEN Use as directed for severe allergic reaction   ergocalciferol  1.25 MG (50000 UT) capsule Commonly known as: Drisdol  Take 1 capsule (50,000 Units total) by mouth once a week.   estradiol  0.1 MG/GM vaginal cream Commonly known as: ESTRACE  Apply a pea-sized amount around the opening of the urethra 3 times weekly.   hydrocortisone  2.5 % rectal cream Commonly known as: ANUSOL -HC Place 1 Application rectally 4 (four) times daily.   Iron  (Ferrous Sulfate ) 325 (65 Fe) MG Tabs Take 325 mg by mouth daily. With food    losartan -hydrochlorothiazide 50-12.5 MG tablet Commonly known as: HYZAAR Take 1 tablet by mouth daily.   ondansetron  4 MG disintegrating tablet Commonly known as: ZOFRAN -ODT Take 1 tablet (4 mg total) by mouth every 8 (eight) hours as needed for nausea or vomiting.   pantoprazole  40 MG tablet Commonly known as: PROTONIX  Take 1 tablet (40 mg total) by mouth daily.   rosuvastatin  20 MG tablet Commonly known as: CRESTOR  Take 1 tablet (20 mg total) by mouth every other day.   sodium chloride  0.65 % Soln nasal spray Commonly known as: OCEAN Place 1 spray into both nostrils as needed (  dryness).   Trospium Chloride 60 MG Cp24 Take 1 capsule (60 mg total) by mouth daily.        Allergies:  Allergies  Allergen Reactions   Iodinated Contrast Media Hypertension    Patient experienced elevated heart rate, hypertension and had to be taken to the ED   Alpha-Gal     facial redness, upset stomach   Gluten Meal Dermatitis    Bloating    Latex Other (See Comments)   Milk-Related Compounds     facial flushing,  upset stomach, diarrhea   Shellfish Allergy  Hives   Sulfa Antibiotics Hives, Nausea Only and Other (See Comments)   Trazodone      Severe sleepiness   Azithromycin Diarrhea, Palpitations and Other (See Comments)    Cramping    Shellfish-Derived Products     Other Reaction(s): abdominal pain   Wheat Rash    Stomach pain    Family History: Family History  Problem Relation Age of Onset   Cancer Mother        ESOPHAGEAL CANCER   Diabetes Father    Hyperlipidemia Father    Heart disease Father    Heart attack Father    Heart disease Brother    Breast cancer Paternal Aunt        Age 38's   Hyperlipidemia Paternal Grandmother    Heart disease Paternal Grandmother    Stroke Paternal Grandmother    Diabetes Paternal Grandfather    Hyperlipidemia Paternal Grandfather    Heart disease Paternal Grandfather    Stroke Paternal Grandfather    Cancer Sister        skin    Stroke Maternal Grandmother    Stroke Maternal Grandfather    Diabetes Brother     Social History:   reports that she has never smoked. She has never used smokeless tobacco. She reports that she does not currently use alcohol. She reports that she does not use drugs.  Physical Exam: BP (!) 160/79   Pulse (!) 110   Constitutional:  Alert and oriented, no acute distress, nontoxic appearing HEENT: [place], AT Cardiovascular: No clubbing, cyanosis, or edema Respiratory: Normal respiratory effort, no increased work of breathing Skin: No rashes, bruises or suspicious lesions Neurologic: Grossly intact, no focal deficits, moving all 4 extremities Psychiatric: Normal mood and affect  Laboratory Data: Results for orders placed or performed in visit on 08/14/23  Microscopic Examination   Collection Time: 08/14/23 11:56 AM   Urine  Result Value Ref Range   WBC, UA 0-5 0 - 5 /hpf   RBC, Urine 0-2 0 - 2 /hpf   Epithelial Cells (non renal) >10 (A) 0 - 10 /hpf   Mucus, UA Present (A) Not Estab.   Bacteria, UA Moderate (A) None seen/Few  Urinalysis, Complete   Collection Time: 08/14/23 11:56 AM  Result Value Ref Range   Specific Gravity, UA 1.030 1.005 - 1.030   pH, UA 6.0 5.0 - 7.5   Color, UA Yellow Yellow   Appearance Ur Clear Clear   Leukocytes,UA Negative Negative   Protein,UA Negative Negative/Trace   Glucose, UA Negative Negative   Ketones, UA Negative Negative   RBC, UA Negative Negative   Bilirubin, UA Negative Negative   Urobilinogen, Ur 0.2 0.2 - 1.0 mg/dL   Nitrite, UA Negative Negative   Microscopic Examination See below:    Assessment & Plan:   1. Recurrent UTI (Primary) Persistent UTI symptoms despite 2 different culture appropriate antibiotics.  Her UA is bland today, though  contaminated.  Question sample dilution as well, she had to drink fluids to provide a specimen.  Will give her 1 dose of empiric fosfomycin, see below. - Urinalysis, Complete - fosfomycin (MONUROL) 3  g PACK; Take 3 g by mouth once for 1 dose.  Dispense: 3 g; Refill: 0  2. Pneumaturia Recent history of colon cancer s/p hemicolectomy.  She is now experiencing pneumaturia, question colovesical fistula.  Will defer CT abdomen pelvis with oral or rectal contrast for now in light of her allergy  and move directly to cystoscopy for further evaluation.  She is in agreement.  Return in about 2 weeks (around 08/28/2023) for Cysto.  Kathreen Pare, PA-C  [facility] [place] [address], [address] [place], [place] [address] [phone]

## 2023-08-14 NOTE — Patient Instructions (Signed)

## 2023-08-15 ENCOUNTER — Telehealth: Payer: Self-pay

## 2023-08-15 LAB — URINALYSIS, COMPLETE
Bilirubin, UA: NEGATIVE
Glucose, UA: NEGATIVE
Ketones, UA: NEGATIVE
Leukocytes,UA: NEGATIVE
Nitrite, UA: NEGATIVE
Protein,UA: NEGATIVE
RBC, UA: NEGATIVE
Specific Gravity, UA: 1.03 (ref 1.005–1.030)
Urobilinogen, Ur: 0.2 mg/dL (ref 0.2–1.0)
pH, UA: 6 (ref 5.0–7.5)

## 2023-08-15 LAB — MICROSCOPIC EXAMINATION: Epithelial Cells (non renal): >10 /HPF — AB (ref 0–10)

## 2023-08-15 NOTE — Telephone Encounter (Addendum)
 Pt states is not going to take the antibiotic Samantha Vaillancourt,PA  prescribed. [facility] went to [facility] and they told her she has arthritis. They gave her medication so she's going to take the medication that [facility] gave her.

## 2023-08-28 ENCOUNTER — Ambulatory Visit

## 2023-08-28 DIAGNOSIS — E538 Deficiency of other specified B group vitamins: Secondary | ICD-10-CM | POA: Diagnosis not present

## 2023-08-28 MED ORDER — CYANOCOBALAMIN 1000 MCG/ML IJ SOLN
1000.0000 ug | Freq: Once | INTRAMUSCULAR | Status: AC
  Administered 2023-08-28: 1000 ug via INTRAMUSCULAR

## 2023-08-28 NOTE — Progress Notes (Addendum)
 Patient presented for B 12 injection to right deltoid, patient voiced no concerns nor showed any signs of distress during injection.

## 2023-09-01 ENCOUNTER — Other Ambulatory Visit: Payer: Self-pay | Admitting: Internal Medicine

## 2023-09-03 ENCOUNTER — Ambulatory Visit: Payer: BLUE CROSS/BLUE SHIELD | Admitting: *Deleted

## 2023-09-03 ENCOUNTER — Telehealth: Payer: Self-pay | Admitting: *Deleted

## 2023-09-03 VITALS — Ht 68.0 in | Wt 186.0 lb

## 2023-09-03 DIAGNOSIS — Z0000 Encounter for general adult medical examination without abnormal findings: Secondary | ICD-10-CM

## 2023-09-03 DIAGNOSIS — Z23 Encounter for immunization: Secondary | ICD-10-CM

## 2023-09-03 DIAGNOSIS — Z1159 Encounter for screening for other viral diseases: Secondary | ICD-10-CM

## 2023-09-03 NOTE — Telephone Encounter (Signed)
 Performed AWV.  Patient stated that she has not had the shingles vaccines because she does not think that she ever chicken pox. Patient wants to know if you will order lab work to determine if she ever had the chicken pox. Order was placed for her to have a Hepatitis C screening.

## 2023-09-03 NOTE — Patient Instructions (Addendum)
 Ms. Escamilla , Thank you for taking time out of your busy schedule to complete your Annual Wellness Visit with me. I enjoyed our conversation and look forward to speaking with you again next year. I, as well as your care team,  appreciate your ongoing commitment to your health goals. Please review the following plan we discussed and let me know if I can assist you in the future. Your Game plan/ To Do List    Referrals: If you haven't heard from the office you've been referred to, please reach out to them at the phone provided.  Order has been placed for your Hepatitis C screening. You can have that drawn at your next visit. Sending Dr. Tullo a message about the shingles vaccines. Follow up Visits: Next Medicare AWV with our clinical staff: 09/06/24 @ 1:00   Have you seen your provider in the last 6 months (3 months if uncontrolled diabetes)? Yes Next Office Visit with your provider: 12/09/23  Clinician Recommendations:  Aim for 30 minutes of exercise or brisk walking, 6-8 glasses of water, and 5 servings of fruits and vegetables each day.       This is a list of the screening recommended for you and due dates:  Health Maintenance  Topic Date Due   Hepatitis C Screening  Never done   Zoster (Shingles) Vaccine (1 of 2) Never done   COVID-19 Vaccine (4 - 2024-25 season) 12/01/2022   Pneumonia Vaccine (2 of 2 - PCV) 10/15/2023*   Flu Shot  10/31/2023   Medicare Annual Wellness Visit  09/02/2024   DTaP/Tdap/Td vaccine (2 - Td or Tdap) 08/22/2025   DEXA scan (bone density measurement)  Completed   HPV Vaccine  Aged Out   Meningitis B Vaccine  Aged Out   Colon Cancer Screening  Discontinued  *Topic was postponed. The date shown is not the original due date.    Advanced directives: (Copy Requested) Please bring a copy of your health care power of attorney and living will to the office to be added to your chart at your convenience. You can mail to [facility] [address]  [place], [place] [address] or email to [email or web address] Advance Care Planning is important because it:  [x]  Makes sure you receive the medical care that is consistent with your values, goals, and preferences  [x]  It provides guidance to your family and loved ones and reduces their decisional burden about whether or not they are making the right decisions based on your wishes.

## 2023-09-03 NOTE — Progress Notes (Signed)
 Subjective:   Anna [name] is a 78 y.o. who presents for a Medicare Wellness preventive visit.  As a reminder, Annual Wellness Visits don't include a physical exam, and some assessments may be limited, especially if this visit is performed virtually. We may recommend an in-person follow-up visit with your provider if needed.  Visit Complete: Virtual I connected with  Janmarie Smoot on 09/03/23 by a audio enabled telemedicine application and verified that I am speaking with the correct person using two identifiers.  Patient Location: Home  Provider Location: Home Office  I discussed the limitations of evaluation and management by telemedicine. The patient expressed understanding and agreed to proceed.  Vital Signs: Because this visit was a virtual/telehealth visit, some criteria may be missing or patient reported. Any vitals not documented were not able to be obtained and vitals that have been documented are patient reported.  VideoDeclined- This patient declined Librarian, academic. Therefore the visit was completed with audio only.  Persons Participating in Visit: Patient.  AWV Questionnaire: No: Patient Medicare AWV questionnaire was not completed prior to this visit.  Cardiac Risk Factors include: advanced age (>99men, >41 women);dyslipidemia;hypertension     Objective:     Today's Vitals   09/03/23 1019  Weight: 186 lb (84.4 kg)  Height: 5\' 8"  (1.727 m)  PainSc: 6    Body mass index is 28.28 kg/m.     09/03/2023   10:43 AM 04/12/2023    7:49 AM 03/19/2023    4:53 PM 11/05/2022    7:37 PM 09/02/2022   10:32 AM 09/17/2021   10:16 AM 08/24/2021    9:15 AM  Advanced Directives  Does Patient Have a Medical Advance Directive? Yes No No Yes No Yes Yes  Type of Estate agent of [place];Living will   Living will  Healthcare Power of [place];Living will Living will  Copy of Healthcare Power of Attorney in Chart? No - copy  requested     No - copy requested   Would patient like information on creating a medical advance directive?  No - Patient declined   No - Patient declined      Current Medications (verified) Outpatient Encounter Medications as of 09/03/2023  Medication Sig   acetaminophen  (TYLENOL ) 500 MG tablet Take 1,000 mg by mouth every 6 (six) hours as needed for moderate pain.   albuterol  (VENTOLIN  HFA) 108 (90 Base) MCG/ACT inhaler Inhale 1-2 puffs into the lungs every 4 (four) hours as needed for wheezing or shortness of breath.   ALPRAZolam  (XANAX ) 0.5 MG tablet Take 1 tablet (0.5 mg total) by mouth at bedtime as needed for anxiety or sleep.   diphenhydrAMINE  (BENADRYL ) 12.5 MG/5ML liquid Take 37.5 mg by mouth 4 (four) times daily as needed for allergies.   ELIQUIS 5 MG TABS tablet Take 5 mg by mouth 2 (two) times daily.   EPINEPHrine  0.3 mg/0.3 mL IJ SOAJ injection Use as directed for severe allergic reaction   ergocalciferol  (DRISDOL ) 1.25 MG (50000 UT) capsule Take 1 capsule (50,000 Units total) by mouth once a week.   estradiol  (ESTRACE ) 0.1 MG/GM vaginal cream Apply a pea-sized amount around the opening of the urethra 3 times weekly.   ferrous sulfate  325 (65 FE) MG tablet TAKE 1 TABLET BY MOUTH EVERY DAY WITH FOOD   hydrocortisone  (ANUSOL -HC) 2.5 % rectal cream Place 1 Application rectally 4 (four) times daily.   losartan -hydrochlorothiazide (HYZAAR) 50-12.5 MG tablet Take 1 tablet by mouth daily.  ondansetron  (ZOFRAN -ODT) 4 MG disintegrating tablet Take 1 tablet (4 mg total) by mouth every 8 (eight) hours as needed for nausea or vomiting.   pantoprazole  (PROTONIX ) 40 MG tablet Take 1 tablet (40 mg total) by mouth daily.   rosuvastatin  (CRESTOR ) 20 MG tablet Take 1 tablet (20 mg total) by mouth every other day.   sodium chloride  (OCEAN) 0.65 % SOLN nasal spray Place 1 spray into both nostrils as needed (dryness).   Trospium Chloride 60 MG CP24 Take 1 capsule (60 mg total) by mouth daily. (Patient  not taking: Reported on 09/03/2023)   No facility-administered encounter medications on file as of 09/03/2023.    Allergies (verified) Iodinated contrast media, Lovenox  [enoxaparin ], Alpha-gal, Gluten meal, Latex, Milk-related compounds, Shellfish allergy , Sulfa antibiotics, Trazodone , Azithromycin, Shellfish-derived products, and Wheat   History: Past Medical History:  Diagnosis Date   Acid reflux    Allergy  to alpha-gal    Anxiety    Bilateral cataracts    Cancer (HCC)    Basal Cell Carcinoma, Melanoma   CIN I (cervical intraepithelial neoplasia I)    Colon polyps    benign   H/O squamous cell carcinoma excision    multiple areas  on body per pt   Hemorrhoid    History of basal cell carcinoma (BCC) excision    History of melanoma excision RIGHT LEG  --  2012   history of SVT (supraventricular tachycardia) (HCC) 10/31/2014   resolved   History of Wolff-Parkinson-White (WPW) syndrome    1996  s/p  ablation   Hypertension    IBS (irritable bowel syndrome)    Lyme disease 2005   Mild carotid artery disease (HCC)    per duplex 12-10-2012  bilateral ICA 1-39% stenosis   OsteoArthritis    Sleep apnea    does not use cpap, going back for cpap titration, mild osa per pt   UTI (urinary tract infection)    finished 7 day course on 08-28-2021 all symptoms resolved   Past Surgical History:  Procedure Laterality Date   BREAST SURGERY Left 1999   BREAST MASS EXCISED, benign   CARDIAC ELECTROPHYSIOLOGY STUDY AND ABLATION  1996   SUCCESSFUL ABLATION OF WOLFE-PARKINSON-WHITE SYNDROME  (NO ISSUES SINCE)   CATARACT EXTRACTION, BILATERAL     CERVICAL BIOPSY  W/ LOOP ELECTRODE EXCISION  2005   dr gottsegen   COLON RESECTION  04/07/2023   COLONOSCOPY     2019 or 2020   COLPOSCOPY     DILATATION & CURETTAGE/HYSTEROSCOPY WITH MYOSURE N/A 09/17/2021   Procedure: DILATATION & CURETTAGE/HYSTEROSCOPY WITH MYOSURE;  Surgeon: Wanita Gutta, MD;  Location: [facility];   Service: Gynecology;  Laterality: N/A;   HYSTEROSCOPY WITH D & C  02/13/2012   Procedure: DILATATION AND CURETTAGE /HYSTEROSCOPY;  Surgeon: Francia Ip, MD;  Location: [facility] [place];  Service: Gynecology;  Laterality: N/A;  deficit - 710   LAPAROSCOPIC CHOLECYSTECTOMY  05-16-2005  dr Afton Horse [facility]   melanoma excised     x  3 , 2 on legs and 1 on arm, "not the kind  that spreads"   SVT-CARDIAC ABLATION     TRANSTHORACIC ECHOCARDIOGRAM  12-10-2012   dr Anastasia Balo   mild LVH, ef 55-60%/  mild MR/  mild LAE and RAE   TUBAL LIGATION     UPPER GI ENDOSCOPY     VULVECTOMY N/A 07/25/2017   Procedure: WIDE EXCISION VULVECTOMY;  Surgeon: Valeen Gartner, MD;  Location: [facility];  Service:  Gynecology;  Laterality: N/A;   Family History  Problem Relation Age of Onset   Cancer Mother        ESOPHAGEAL CANCER   Diabetes Father    Hyperlipidemia Father    Heart disease Father    Heart attack Father    Heart disease Brother    Breast cancer Paternal Aunt        Age 18's   Hyperlipidemia Paternal Grandmother    Heart disease Paternal Grandmother    Stroke Paternal Grandmother    Diabetes Paternal Grandfather    Hyperlipidemia Paternal Grandfather    Heart disease Paternal Grandfather    Stroke Paternal Grandfather    Cancer Sister        skin   Stroke Maternal Grandmother    Stroke Maternal Grandfather    Diabetes Brother    Social History   Socioeconomic History   Marital status: Divorced    Spouse name: Not on file   Number of children: 2   Years of education: Not on file   Highest education level: Some college, no degree  Occupational History   Not on file  Tobacco Use   Smoking status: Never   Smokeless tobacco: Never  Vaping Use   Vaping status: Never Used  Substance and Sexual Activity   Alcohol use: Not Currently   Drug use: No   Sexual activity: Not Currently    Birth control/protection: Surgical    Comment: 1st intercourse 78  yo--Fewer than 5 partners  Other Topics Concern   Not on file  Social History Narrative   Not on file   Social Drivers of Health   Financial Resource Strain: Low Risk  (09/03/2023)   Overall Financial Resource Strain (CARDIA)    Difficulty of Paying Living Expenses: Not hard at all  Food Insecurity: No Food Insecurity (09/03/2023)   Hunger Vital Sign    Worried About Running Out of Food in the Last Year: Never true    Ran Out of Food in the Last Year: Never true  Transportation Needs: No Transportation Needs (09/03/2023)   PRAPARE - Administrator, Civil Service (Medical): No    Lack of Transportation (Non-Medical): No  Physical Activity: Inactive (09/03/2023)   Exercise Vital Sign    Days of Exercise per Week: 0 days    Minutes of Exercise per Session: 0 min  Stress: Stress Concern Present (09/03/2023)   [employer] of Occupational Health - Occupational Stress Questionnaire    Feeling of Stress : To some extent  Social Connections: Moderately Integrated (09/03/2023)   Social Connection and Isolation Panel [NHANES]    Frequency of Communication with Friends and Family: More than three times a week    Frequency of Social Gatherings with Friends and Family: More than three times a week    Attends Religious Services: More than 4 times per year    Active Member of [employer] or Organizations: Yes    Attends Engineer, structural: More than 4 times per year    Marital Status: Divorced    Tobacco Counseling Counseling given: Not Answered    Clinical Intake:  Pre-visit preparation completed: Yes  Pain : 0-10 Pain Score: 6  Pain Type: Chronic pain Pain Location: Hip Pain Orientation: Right Pain Descriptors / Indicators: Aching, Stabbing Pain Onset: More than a month ago Pain Frequency: Constant     BMI - recorded: 28.28 Nutritional Status: BMI 25 -29 Overweight Nutritional Risks: None Diabetes: No  Lab Results  Component  Value Date   HGBA1C 6.2 (H)  06/26/2022   HGBA1C 6.3 12/24/2021     How often do you need to have someone help you when you read instructions, pamphlets, or other written materials from your doctor or pharmacy?: 1 - Never  Interpreter Needed?: No  Information entered by :: R. Evea Sheek LPN   Activities of Daily Living     09/03/2023   10:21 AM  In your present state of health, do you have any difficulty performing the following activities:  Hearing? 0  Vision? 0  Comment readers  Difficulty concentrating or making decisions? 0  Walking or climbing stairs? 1  Dressing or bathing? 0  Doing errands, shopping? 0  Preparing Food and eating ? N  Using the Toilet? N  In the past six months, have you accidently leaked urine? N  Do you have problems with loss of bowel control? N  Managing your Medications? N  Managing your Finances? N  Housekeeping or managing your Housekeeping? N    Patient Care Team: Thersia Flax, MD as PCP - General (Internal Medicine) Candyce Champagne, MD as Consulting Physician (General Surgery) Evangeline Hilts, MD as Consulting Physician (Gastroenterology) Valeen Gartner, MD as Attending Physician (Gynecologic Oncology) Ronney Cola, MD as Consulting Physician (Cardiology) Dustin Gimenez, MD as Consulting Physician (Urology)  I have updated your Care Teams any recent Medical Services you may have received from other providers in the past year.     Assessment:    This is a routine wellness examination for Anna [name].  Hearing/Vision screen Hearing Screening - Comments:: No issues Vision Screening - Comments:: readers   Goals Addressed             This Visit's Progress    Patient Stated       Wants to get back to where she can start back exercising       Depression Screen     09/03/2023   10:34 AM 11/18/2022    3:44 PM 10/15/2022   10:08 AM 09/02/2022   11:42 AM 09/02/2022   10:30 AM 08/14/2022    2:13 PM 06/26/2022   10:41 AM  PHQ 2/9 Scores  PHQ - 2 Score 3 0 2 2 0 6  0  PHQ- 9 Score 5 6 7 10  0 16     Fall Risk     09/03/2023   10:25 AM 11/18/2022    3:44 PM 10/15/2022   10:08 AM 09/02/2022   11:41 AM 09/02/2022   10:32 AM  Fall Risk   Falls in the past year? 0 0 0 0 0  Number falls in past yr: 0 0 0 0 0  Injury with Fall? 0 0 0 0 0  Risk for fall due to : No Fall Risks No Fall Risks No Fall Risks No Fall Risks No Fall Risks  Follow up Falls evaluation completed;Falls prevention discussed Falls evaluation completed Falls evaluation completed Falls evaluation completed Falls prevention discussed;Falls evaluation completed    MEDICARE RISK AT HOME:  Medicare Risk at Home Any stairs in or around the home?: No If so, are there any without handrails?: No Home free of loose throw rugs in walkways, pet beds, electrical cords, etc?: Yes Adequate lighting in your home to reduce risk of falls?: Yes Life alert?: No Use of a cane, walker or w/c?: No Grab bars in the bathroom?: Yes Shower chair or bench in shower?: Yes Elevated toilet seat or a handicapped toilet?: Yes  TIMED UP AND GO:  Was the test performed?  No  Cognitive Function: 6CIT completed        09/03/2023   10:43 AM 09/02/2022   10:44 AM  6CIT Screen  What Year? 0 points 0 points  What month? 0 points 0 points  What time? 0 points 0 points  Count back from 20 0 points 0 points  Months in reverse 0 points 0 points  Repeat phrase 0 points   Total Score 0 points     Immunizations Immunization History  Administered Date(s) Administered   Fluad Quad(high Dose 65+) 12/22/2020, 12/26/2021   Fluad Trivalent(High Dose 65+) 12/17/2022   Influenza,inj,Quad PF,6+ Mos 03/03/2012   PFIZER(Purple Top)SARS-COV-2 Vaccination 05/07/2019, 05/28/2019, 01/18/2020   Pneumococcal Polysaccharide-23 04/23/2012   Rsv, Bivalent, Protein Subunit Rsvpref,pf Pattricia Bores) 04/03/2023   Tdap 08/23/2015    Screening Tests Health Maintenance  Topic Date Due   Hepatitis C Screening  Never done   Zoster Vaccines-  Shingrix (1 of 2) Never done   COVID-19 Vaccine (4 - 2024-25 season) 12/01/2022   Medicare Annual Wellness (AWV)  09/02/2023   Pneumonia Vaccine 85+ Years old (2 of 2 - PCV) 10/15/2023 (Originally 04/23/2013)   INFLUENZA VACCINE  10/31/2023   DTaP/Tdap/Td (2 - Td or Tdap) 08/22/2025   DEXA SCAN  Completed   HPV VACCINES  Aged Out   Meningococcal B Vaccine  Aged Out   Colonoscopy  Discontinued    Health Maintenance  Health Maintenance Due  Topic Date Due   Hepatitis C Screening  Never done   Zoster Vaccines- Shingrix (1 of 2) Never done   COVID-19 Vaccine (4 - 2024-25 season) 12/01/2022   Medicare Annual Wellness (AWV)  09/02/2023   Health Maintenance Items Addressed: Labs Ordered: Hepatitis C screening ordered, Discussed the need to update shingles and covid vaccines. Patient wants PCP to order lab test to check if she has ever had chicken pox. Patient does not think that she ever had chicken pox.   Additional Screening:  Vision Screening: Recommended annual ophthalmology exams for early detection of glaucoma and other disorders of the eye. Up to date  Dr. Parke Boll  Would you like a referral to an eye doctor? No    Dental Screening: Recommended annual dental exams for proper oral hygiene  Community Resource Referral / Chronic Care Management: CRR required this visit?  No   CCM required this visit?  No   Plan:    I have personally reviewed and noted the following in the patient's chart:   Medical and social history Use of alcohol, tobacco or illicit drugs  Current medications and supplements including opioid prescriptions. Patient is not currently taking opioid prescriptions. Functional ability and status Nutritional status Physical activity Advanced directives List of other physicians Hospitalizations, surgeries, and ER visits in previous 12 months Vitals Screenings to include cognitive, depression, and falls Referrals and appointments  In addition, I have reviewed  and discussed with patient certain preventive protocols, quality metrics, and best practice recommendations. A written personalized care plan for preventive services as well as general preventive health recommendations were provided to patient.   Felicitas Horse, LPN   3/0/8657   After Visit Summary: (MyChart) Due to this being a telephonic visit, the after visit summary with patients personalized plan was offered to patient via MyChart   Notes: Nothing significant to report at this time. Phone note sent to PCP

## 2023-09-08 NOTE — Telephone Encounter (Signed)
 Pt is aware and stated that she would call back to schedule the lab appt and her nurse visit appt for her b12.

## 2023-09-10 ENCOUNTER — Ambulatory Visit (INDEPENDENT_AMBULATORY_CARE_PROVIDER_SITE_OTHER): Admitting: Urology

## 2023-09-10 VITALS — BP 122/75 | HR 73

## 2023-09-10 DIAGNOSIS — N390 Urinary tract infection, site not specified: Secondary | ICD-10-CM

## 2023-09-10 DIAGNOSIS — N3281 Overactive bladder: Secondary | ICD-10-CM

## 2023-09-10 DIAGNOSIS — Z87440 Personal history of urinary (tract) infections: Secondary | ICD-10-CM | POA: Diagnosis not present

## 2023-09-10 DIAGNOSIS — N3080 Other cystitis without hematuria: Secondary | ICD-10-CM

## 2023-09-10 DIAGNOSIS — N3289 Other specified disorders of bladder: Secondary | ICD-10-CM

## 2023-09-10 LAB — URINALYSIS, COMPLETE
Bilirubin, UA: NEGATIVE
Glucose, UA: NEGATIVE
Ketones, UA: NEGATIVE
Leukocytes,UA: NEGATIVE
Nitrite, UA: NEGATIVE
Protein,UA: NEGATIVE
Specific Gravity, UA: 1.025 (ref 1.005–1.030)
Urobilinogen, Ur: 0.2 mg/dL (ref 0.2–1.0)
pH, UA: 5.5 (ref 5.0–7.5)

## 2023-09-10 LAB — MICROSCOPIC EXAMINATION: Epithelial Cells (non renal): >10 /HPF — AB (ref 0–10)

## 2023-09-10 MED ORDER — BETAMETHASONE VALERATE 0.1 % EX OINT: 1.0000 | TOPICAL_OINTMENT | Freq: Two times a day (BID) | CUTANEOUS | 0 refills | Status: AC

## 2023-09-10 MED ORDER — PHENAZOPYRIDINE HCL 200 MG PO TABS: 200.0000 mg | ORAL_TABLET | Freq: Three times a day (TID) | ORAL | 0 refills | Status: DC | PRN

## 2023-09-11 NOTE — Progress Notes (Signed)
 09/10/23  CC:  Chief Complaint  Patient presents with   Cysto    HPI: 78 year old female with persistent dysuria despite multiple rounds of antibiotics.  She was last treated for urine infection with antibiotics completed a few weeks ago, growing E. coli.  Her urinalysis today is actually unremarkable but she continues to have dysuria.  She also reported that she was having some pneumaturia although is not sure that she is still having it.  Results for orders placed or performed in visit on 09/10/23  Microscopic Examination   Collection Time: 09/10/23 11:01 AM   Urine  Result Value Ref Range   WBC, UA 0-5 0 - 5 /hpf   RBC, Urine 0-2 0 - 2 /hpf   Epithelial Cells (non renal) >10 (A) 0 - 10 /hpf   Casts Present (A) None seen /lpf   Cast Type Hyaline casts N/A   Mucus, UA Present (A) Not Estab.   Bacteria, UA Moderate (A) None seen/Few  Urinalysis, Complete   Collection Time: 09/10/23 11:01 AM  Result Value Ref Range   Specific Gravity, UA 1.025 1.005 - 1.030   pH, UA 5.5 5.0 - 7.5   Color, UA Yellow Yellow   Appearance Ur Clear Clear   Leukocytes,UA Negative Negative   Protein,UA Negative Negative/Trace   Glucose, UA Negative Negative   Ketones, UA Negative Negative   RBC, UA Trace (A) Negative   Bilirubin, UA Negative Negative   Urobilinogen, Ur 0.2 0.2 - 1.0 mg/dL   Nitrite, UA Negative Negative   Microscopic Examination See below:    v  Blood pressure 122/75, pulse 73. NED. A&Ox3.   No respiratory distress   Abd soft, NT, ND Normal external genitalia with patent urethral meatus  Cystoscopy Procedure Note  Patient identification was confirmed, informed consent was obtained, and patient was prepped using Betadine solution.  Lidocaine  jelly was administered per urethral meatus.    Procedure: - Flexible cystoscope introduced, without any difficulty.   - Thorough search of the bladder revealed:    normal urethral meatus    Base of irregular diffuse erythema  throughout the entirety of bladder with small blisterlike lesions consistent with cystitis cystica.    no stones    no ulcers     no tumors    no urethral polyps    no trabeculation  - Ureteral orifices were normal in position and appearance.  Post-Procedure: - Patient tolerated the procedure well  Assessment/ Plan:  1. Recurrent UTI (Primary) UA today is negative, suspect there is ongoing inflammation rather than true bacterial infection  Continue topical estrogen cream and other intervention previously discussed - Urinalysis, Complete  2. OAB (overactive bladder) As per previous - Urinalysis, Complete  3. Erythematous bladder mucosa Lengthy discussion today about cystoscopic findings.  Highly suspicious these are inflammatory.  At this point, there are no medications in particular that would be helpful.  She may use Azo as needed.  She is not able to take NSAIDs secondary to anticoagulation therapy.  She did ask about steroids but prefer to avoid this if at all possible.  She is highly concerned about cancer given her history of colon cancer.  Will send a urine cytology today for reassurance.  There is no evidence of fistula.  Plan to repeat cystoscopy in about 2 months to ensure that erythema has resolved.  If not, may warrant further intervention such as a biopsy.  She is agreeable this plan.  4. Cystitis cystica As above  Dustin Gimenez, MD

## 2023-09-24 LAB — HM MAMMOGRAPHY

## 2023-09-26 ENCOUNTER — Encounter: Payer: Self-pay | Admitting: Internal Medicine

## 2023-09-29 ENCOUNTER — Ambulatory Visit

## 2023-10-13 ENCOUNTER — Ambulatory Visit
Admission: RE | Admit: 2023-10-13 | Discharge: 2023-10-13 | Disposition: A | Discharge status: Home / Self Care | Source: Ambulatory Visit | Attending: Emergency Medicine | Admitting: Emergency Medicine

## 2023-10-13 VITALS — BP 164/82 | HR 100 | Temp 98.0°F | Resp 18

## 2023-10-13 DIAGNOSIS — R350 Frequency of micturition: Secondary | ICD-10-CM | POA: Diagnosis present

## 2023-10-13 LAB — POCT URINALYSIS DIP (MANUAL ENTRY)
Bilirubin, UA: NEGATIVE
Blood, UA: NEGATIVE
Glucose, UA: NEGATIVE mg/dL
Ketones, POC UA: NEGATIVE mg/dL
Nitrite, UA: NEGATIVE
Protein Ur, POC: NEGATIVE mg/dL
Spec Grav, UA: 1.02 (ref 1.010–1.025)
Urobilinogen, UA: 0.2 U/dL
pH, UA: 5.5 (ref 5.0–8.0)

## 2023-10-13 MED ORDER — CEPHALEXIN 500 MG PO CAPS: 500.0000 mg | ORAL_CAPSULE | Freq: Four times a day (QID) | ORAL | 0 refills | Status: DC

## 2023-10-13 NOTE — Discharge Instructions (Addendum)
 Your urinalysis does not show infection, your urine will be sent to the lab to determine exactly which bacteria is present, if any changes need to be made to your medications you will be notified  Begin use of cephalexin  every 6 hours for 5 days  You may use over-the-counter Azo to help minimize your symptoms until antibiotic removes bacteria, this medication will turn your urine orange  Increase your fluid intake through use of water  As always practice good hygiene, wiping front to back and avoidance of scented vaginal products to prevent further irritation  If symptoms continue to persist after use of medication or recur please follow-up with urgent care or your primary doctor as needed

## 2023-10-13 NOTE — ED Provider Notes (Signed)
 [name]    CSN: [id number] Arrival date & time: 10/13/23  1051      History   Chief Complaint Chief Complaint  Patient presents with   Urinary Frequency    Lower back pain, burning, frequency. All systems of a ITI which I have had recurrent issues over the years. - Entered by patient    HPI Anna [name] is a 78 y.o. female.   Patient presents for evaluation of persisting urinary frequency, dysuria, intermittent abdominal pain and lower back pain worsening over the past 7 days.  Endorses that she had symptoms in June and was evaluated by her urologist in which cyst cystoscopy was done showing moderate bacteria, endorses no treatment was given at that time and as her doctors left the practice she has not completed follow-up.  Denies hematuria, fever, vaginal symptoms.  Past Medical History:  Diagnosis Date   Acid reflux    Allergy  to alpha-gal    Anxiety    Bilateral cataracts    Cancer (HCC)    Basal Cell Carcinoma, Melanoma   CIN I (cervical intraepithelial neoplasia I)    Colon polyps    benign   H/O squamous cell carcinoma excision    multiple areas  on body per pt   Hemorrhoid    History of basal cell carcinoma (BCC) excision    History of melanoma excision RIGHT LEG  --  2012   history of SVT (supraventricular tachycardia) (HCC) 10/31/2014   resolved   History of Wolff-Parkinson-Dianne Bady (WPW) syndrome    1996  s/p  ablation   Hypertension    IBS (irritable bowel syndrome)    Lyme disease 2005   Mild carotid artery disease (HCC)    per duplex 12-10-2012  bilateral ICA 1-39% stenosis   OsteoArthritis    Sleep apnea    does not use cpap, going back for cpap titration, mild osa per pt   UTI (urinary tract infection)    finished 7 day course on 08-28-2021 all symptoms resolved    Patient Active Problem List   Diagnosis Date Noted   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   Iron  deficiency anemia 06/04/2023   Hospital discharge follow-up  06/03/2023   Dysphagia 10/16/2022   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   Thoracic aortic atherosclerosis (HCC) 09/02/2022   Exercise hypoxemia 09/02/2022   Fatigue 08/15/2022   Bloating symptom 08/15/2022   Overactive bladder 08/14/2022   History of Wolff-Parkinson-Annalynne Ibanez (WPW) syndrome 12/25/2021   Post-menopausal bleeding 05/13/2021   Gastroesophageal reflux disease 03/13/2021   Hematochezia 03/13/2021   Adenocarcinoma of colon (HCC) 03/13/2021   OSA (obstructive sleep apnea) 11/09/2020   Edema 09/20/2020   Essential hypertension 09/20/2020   B12 deficiency 06/24/2020   Vitamin D  deficiency 06/24/2020   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   Allergy  to alpha-gal 06/20/2020   Intestinal malabsorption 06/20/2020   Irritable bowel syndrome with constipation 01/04/2020   Prolapsed internal hemorrhoids, grade 4 01/04/2020   Internal and external bleeding hemorrhoids 01/04/2020   Melanoma (HCC) 03/22/2019   Heart palpitations 03/22/2019   Hyperlipidemia LDL goal <100 04/27/2014   Chronic osteoarthritis 04/27/2014    Past Surgical History:  Procedure Laterality Date   BREAST SURGERY Left 1999   BREAST MASS EXCISED, benign   CARDIAC ELECTROPHYSIOLOGY STUDY AND ABLATION  1996   SUCCESSFUL ABLATION OF WOLFE-PARKINSON-Nycere Presley SYNDROME  (NO ISSUES SINCE)   CATARACT EXTRACTION, BILATERAL     CERVICAL BIOPSY  W/ LOOP ELECTRODE EXCISION  2005   dr gottsegen   COLON RESECTION  04/07/2023   COLONOSCOPY     2019 or 2020   COLPOSCOPY     DILATATION & CURETTAGE/HYSTEROSCOPY WITH MYOSURE N/A 09/17/2021   Procedure: DILATATION & CURETTAGE/HYSTEROSCOPY WITH MYOSURE;  Surgeon: Jannis Kate Norris, MD;  Location: [facility];  Service: Gynecology;  Laterality: N/A;   HYSTEROSCOPY WITH D & C  02/13/2012   Procedure: DILATATION AND CURETTAGE /HYSTEROSCOPY;  Surgeon: Toribio LITTIE Campanile, MD;  Location: [facility];  Service: Gynecology;   Laterality: N/A;  deficit - 710   LAPAROSCOPIC CHOLECYSTECTOMY  05-16-2005  dr [facility]   melanoma excised     x  3 , 2 on legs and 1 on arm, not the kind  that spreads   SVT-CARDIAC ABLATION     TRANSTHORACIC ECHOCARDIOGRAM  12-10-2012   dr blanca   mild LVH, ef 55-60%/  mild MR/  mild LAE and RAE   TUBAL LIGATION     UPPER GI ENDOSCOPY     VULVECTOMY N/A 07/25/2017   Procedure: WIDE EXCISION VULVECTOMY;  Surgeon: Devonna Toribio, MD;  Location: [facility];  Service: Gynecology;  Laterality: N/A;    OB History     Gravida  2   Para  2   Term  2   Preterm      AB      Living  2      SAB      IAB      Ectopic      Multiple      Live Births               Home Medications    Prior to Admission medications   Medication Sig Start Date End Date Taking? Authorizing Provider  cephALEXin  (KEFLEX ) 500 MG capsule Take 1 capsule (500 mg total) by mouth 4 (four) times daily. 10/13/23  Yes Haily Caley R, NP  acetaminophen  (TYLENOL ) 500 MG tablet Take 1,000 mg by mouth every 6 (six) hours as needed for moderate pain.    [provider]  albuterol  (VENTOLIN  HFA) 108 (90 Base) MCG/ACT inhaler Inhale 1-2 puffs into the lungs every 4 (four) hours as needed for wheezing or shortness of breath. 12/18/22   Isadora Hose, MD  ALPRAZolam  (XANAX ) 0.5 MG tablet Take 1 tablet (0.5 mg total) by mouth at bedtime as needed for anxiety or sleep. 08/13/23   Marylynn Verneita LITTIE, MD  betamethasone  valerate ointment (VALISONE ) 0.1 % Apply 1 Application topically 2 (two) times daily. 09/10/23   Penne Knee, MD  diphenhydrAMINE  (BENADRYL ) 12.5 MG/5ML liquid Take 37.5 mg by mouth 4 (four) times daily as needed for allergies.    [provider]  ELIQUIS 5 MG TABS tablet Take 5 mg by mouth 2 (two) times daily.    [provider]  EPINEPHrine  0.3 mg/0.3 mL IJ SOAJ injection Use as directed for severe allergic reaction 06/26/22   Marylynn Verneita LITTIE,  MD  ergocalciferol  (DRISDOL ) 1.25 MG (50000 UT) capsule Take 1 capsule (50,000 Units total) by mouth once a week. 08/17/22   Marylynn Verneita LITTIE, MD  estradiol  (ESTRACE ) 0.1 MG/GM vaginal cream Apply a pea-sized amount around the opening of the urethra 3 times weekly. 07/29/23   Vaillancourt, Samantha, PA-C  ferrous sulfate  325 (65 FE) MG tablet TAKE 1 TABLET BY MOUTH EVERY DAY WITH FOOD 09/02/23   Marylynn Verneita LITTIE, MD  hydrocortisone  (ANUSOL -HC) 2.5 % rectal cream Place 1 Application rectally  4 (four) times daily. 06/03/23   Tullo, Teresa L, MD  losartan -hydrochlorothiazide (HYZAAR) 50-12.5 MG tablet Take 1 tablet by mouth daily. 06/03/23   Tullo, Teresa L, MD  methocarbamol (ROBAXIN) 500 MG tablet Take 500 mg by mouth every 8 (eight) hours as needed. 08/15/23   [provider]  ondansetron  (ZOFRAN -ODT) 4 MG disintegrating tablet Take 1 tablet (4 mg total) by mouth every 8 (eight) hours as needed for nausea or vomiting. 04/12/23   Arlander Charleston, MD  pantoprazole  (PROTONIX ) 40 MG tablet Take 1 tablet (40 mg total) by mouth daily. 06/03/23   Marylynn Verneita CROME, MD  phenazopyridine  (PYRIDIUM ) 200 MG tablet Take 1 tablet (200 mg total) by mouth 3 (three) times daily as needed for pain. 09/10/23   Penne Knee, MD  rosuvastatin  (CRESTOR ) 20 MG tablet Take 1 tablet (20 mg total) by mouth every other day. 06/03/23   Marylynn Verneita CROME, MD  sodium chloride  (OCEAN) 0.65 % SOLN nasal spray Place 1 spray into both nostrils as needed (dryness).    [provider]  traMADol (ULTRAM) 50 MG tablet Take 50 mg by mouth every 6 (six) hours as needed.    [provider]  Trospium Chloride 60 MG CP24 Take 1 capsule (60 mg total) by mouth daily. 07/30/23   Vaillancourt, Samantha, PA-C    Family History Family History  Problem Relation Age of Onset   Cancer Mother        ESOPHAGEAL CANCER   Diabetes Father    Hyperlipidemia Father    Heart disease Father    Heart attack Father    Heart disease Brother     Breast cancer Paternal Aunt        Age 38's   Hyperlipidemia Paternal Grandmother    Heart disease Paternal Grandmother    Stroke Paternal Grandmother    Diabetes Paternal Grandfather    Hyperlipidemia Paternal Grandfather    Heart disease Paternal Grandfather    Stroke Paternal Grandfather    Cancer Sister        skin   Stroke Maternal Grandmother    Stroke Maternal Grandfather    Diabetes Brother     Social History Social History   Tobacco Use   Smoking status: Never   Smokeless tobacco: Never  Vaping Use   Vaping status: Never Used  Substance Use Topics   Alcohol use: Not Currently   Drug use: No     Allergies   Iodinated contrast media, Lovenox  [enoxaparin ], Alpha-gal, Gluten meal, Latex, Milk-related compounds, Shellfish allergy , Sulfa antibiotics, Trazodone , Azithromycin, Shellfish-derived products, and Wheat   Review of Systems Review of Systems   Physical Exam Triage Vital Signs ED Triage Vitals  Encounter Vitals Group     BP 10/13/23 1109 (!) 164/82     Girls Systolic BP Percentile --      Girls Diastolic BP Percentile --      Boys Systolic BP Percentile --      Boys Diastolic BP Percentile --      Pulse Rate 10/13/23 1109 100     Resp 10/13/23 1109 18     Temp 10/13/23 1109 98 F (36.7 C)     Temp Source 10/13/23 1109 Oral     SpO2 10/13/23 1109 97 %     Weight --      Height --      Head Circumference --      Peak Flow --      Pain Score 10/13/23 1112 6  Pain Loc --      Pain Education --      Exclude from Growth Chart --    No data found.  Updated Vital Signs BP (!) 164/82 (BP Location: Left Arm)   Pulse 100   Temp 98 F (36.7 C) (Oral)   Resp 18   SpO2 97%   Visual Acuity Right Eye Distance:   Left Eye Distance:   Bilateral Distance:    Right Eye Near:   Left Eye Near:    Bilateral Near:     Physical Exam Constitutional:      Appearance: Normal appearance.  Eyes:     Extraocular Movements: Extraocular movements  intact.  Pulmonary:     Effort: Pulmonary effort is normal.  Abdominal:     Tenderness: There is right CVA tenderness and left CVA tenderness. There is no guarding.  Neurological:     Mental Status: She is alert and oriented to person, place, and time.      UC Treatments / Results  Labs (all labs ordered are listed, but only abnormal results are displayed) Labs Reviewed  POCT URINALYSIS DIP (MANUAL ENTRY) - Abnormal; Notable for the following components:      Result Value   Leukocytes, UA Trace (*)    All other components within normal limits  URINE CULTURE    EKG   Radiology No results found.  Procedures Procedures (including critical care time)  Medications Ordered in UC Medications - No data to display  Initial Impression / Assessment and Plan / UC Course  I have reviewed the triage vital signs and the nursing notes.  Pertinent labs & imaging results that were available during my care of the patient were reviewed by me and considered in my medical decision making (see chart for details).  Urinary frequency  Urinalysis showing trace leukocytes, negative for nitrates, sent for culture, empirically placed on cephalexin  as she is symptomatic has had success with this medicine in the past, CVA tenderness noted on exam but no signs of sepsis, stable for outpatient management, recommended supportive care and advised PCP follow-up for further evaluation and for additional referral to urology Final Clinical Impressions(s) / UC Diagnoses   Final diagnoses:  Urinary frequency     Discharge Instructions      Your urinalysis does not show infection, your urine will be sent to the lab to determine exactly which bacteria is present, if any changes need to be made to your medications you will be notified  Begin use of cephalexin  every 6 hours for 5 days  You may use over-the-counter Azo to help minimize your symptoms until antibiotic removes bacteria, this medication will  turn your urine orange  Increase your fluid intake through use of water  As always practice good hygiene, wiping front to back and avoidance of scented vaginal products to prevent further irritation  If symptoms continue to persist after use of medication or recur please follow-up with urgent care or your primary doctor as needed    ED Prescriptions     Medication Sig Dispense Auth. Provider   cephALEXin  (KEFLEX ) 500 MG capsule Take 1 capsule (500 mg total) by mouth 4 (four) times daily. 20 capsule Nikoli Nasser R, NP      PDMP not reviewed this encounter.   Teresa Shelba SAUNDERS, NP 10/13/23 1139

## 2023-10-13 NOTE — ED Triage Notes (Signed)
 Patient complains of urinary frequency, painful urination and lower back pain. Rates pain 6/10

## 2023-10-14 LAB — URINE CULTURE: Culture: NO GROWTH

## 2023-10-15 ENCOUNTER — Ambulatory Visit (HOSPITAL_COMMUNITY): Payer: Self-pay

## 2023-10-31 ENCOUNTER — Observation Stay
Admission: EM | Admit: 2023-10-31 | Discharge: 2023-11-02 | Disposition: A | Discharge status: Home / Self Care | Attending: Obstetrics and Gynecology | Admitting: Obstetrics and Gynecology

## 2023-10-31 ENCOUNTER — Other Ambulatory Visit: Payer: Self-pay

## 2023-10-31 ENCOUNTER — Emergency Department

## 2023-10-31 DIAGNOSIS — Z85038 Personal history of other malignant neoplasm of large intestine: Secondary | ICD-10-CM | POA: Insufficient documentation

## 2023-10-31 DIAGNOSIS — G4733 Obstructive sleep apnea (adult) (pediatric): Secondary | ICD-10-CM | POA: Diagnosis present

## 2023-10-31 DIAGNOSIS — I10 Essential (primary) hypertension: Secondary | ICD-10-CM | POA: Diagnosis present

## 2023-10-31 DIAGNOSIS — K649 Unspecified hemorrhoids: Secondary | ICD-10-CM | POA: Diagnosis not present

## 2023-10-31 DIAGNOSIS — K55019 Acute (reversible) ischemia of small intestine, extent unspecified: Secondary | ICD-10-CM | POA: Insufficient documentation

## 2023-10-31 DIAGNOSIS — N179 Acute kidney failure, unspecified: Secondary | ICD-10-CM

## 2023-10-31 DIAGNOSIS — Z91040 Latex allergy status: Secondary | ICD-10-CM | POA: Insufficient documentation

## 2023-10-31 DIAGNOSIS — R42 Dizziness and giddiness: Secondary | ICD-10-CM | POA: Diagnosis not present

## 2023-10-31 DIAGNOSIS — C189 Malignant neoplasm of colon, unspecified: Secondary | ICD-10-CM | POA: Diagnosis not present

## 2023-10-31 DIAGNOSIS — K5904 Chronic idiopathic constipation: Secondary | ICD-10-CM

## 2023-10-31 DIAGNOSIS — Z85828 Personal history of other malignant neoplasm of skin: Secondary | ICD-10-CM | POA: Insufficient documentation

## 2023-10-31 DIAGNOSIS — K219 Gastro-esophageal reflux disease without esophagitis: Secondary | ICD-10-CM | POA: Diagnosis not present

## 2023-10-31 DIAGNOSIS — Z8679 Personal history of other diseases of the circulatory system: Secondary | ICD-10-CM | POA: Insufficient documentation

## 2023-10-31 DIAGNOSIS — E785 Hyperlipidemia, unspecified: Secondary | ICD-10-CM | POA: Diagnosis not present

## 2023-10-31 DIAGNOSIS — K55069 Acute infarction of intestine, part and extent unspecified: Secondary | ICD-10-CM | POA: Diagnosis present

## 2023-10-31 DIAGNOSIS — K922 Gastrointestinal hemorrhage, unspecified: Secondary | ICD-10-CM | POA: Diagnosis present

## 2023-10-31 DIAGNOSIS — K625 Hemorrhage of anus and rectum: Secondary | ICD-10-CM | POA: Diagnosis present

## 2023-10-31 DIAGNOSIS — K5900 Constipation, unspecified: Secondary | ICD-10-CM | POA: Diagnosis present

## 2023-10-31 DIAGNOSIS — F419 Anxiety disorder, unspecified: Secondary | ICD-10-CM | POA: Insufficient documentation

## 2023-10-31 DIAGNOSIS — E876 Hypokalemia: Secondary | ICD-10-CM

## 2023-10-31 DIAGNOSIS — K581 Irritable bowel syndrome with constipation: Secondary | ICD-10-CM | POA: Diagnosis present

## 2023-10-31 DIAGNOSIS — K5641 Fecal impaction: Principal | ICD-10-CM

## 2023-10-31 LAB — HEMOGLOBIN AND HEMATOCRIT, BLOOD
HCT: 28.8 % — ABNORMAL LOW (ref 36.0–46.0)
Hemoglobin: 9.3 g/dL — ABNORMAL LOW (ref 12.0–15.0)

## 2023-10-31 LAB — CBC
HCT: 36.6 % (ref 36.0–46.0)
Hemoglobin: 11.8 g/dL — ABNORMAL LOW (ref 12.0–15.0)
MCH: 29.4 pg (ref 26.0–34.0)
MCHC: 32.2 g/dL (ref 30.0–36.0)
MCV: 91.3 fL (ref 80.0–100.0)
Platelets: 346 K/uL (ref 150–400)
RBC: 4.01 MIL/uL (ref 3.87–5.11)
RDW: 15.9 % — ABNORMAL HIGH (ref 11.5–15.5)
WBC: 9.8 K/uL (ref 4.0–10.5)
nRBC: 0 % (ref 0.0–0.2)

## 2023-10-31 LAB — COMPREHENSIVE METABOLIC PANEL WITH GFR
ALT: 16 U/L (ref 0–44)
AST: 23 U/L (ref 15–41)
Albumin: 3.7 g/dL (ref 3.5–5.0)
Alkaline Phosphatase: 66 U/L (ref 38–126)
Anion gap: 12 (ref 5–15)
BUN: 18 mg/dL (ref 8–23)
CO2: 23 mmol/L (ref 22–32)
Calcium: 8.9 mg/dL (ref 8.9–10.3)
Chloride: 100 mmol/L (ref 98–111)
Creatinine, Ser: 1.2 mg/dL — ABNORMAL HIGH (ref 0.44–1.00)
GFR, Estimated: 47 mL/min — ABNORMAL LOW (ref >60)
Glucose, Bld: 134 mg/dL — ABNORMAL HIGH (ref 70–99)
Potassium: 3.4 mmol/L — ABNORMAL LOW (ref 3.5–5.1)
Sodium: 135 mmol/L (ref 135–145)
Total Bilirubin: 0.7 mg/dL (ref 0.0–1.2)
Total Protein: 7.3 g/dL (ref 6.5–8.1)

## 2023-10-31 MED ORDER — MAGNESIUM HYDROXIDE 400 MG/5ML PO SUSP: 30.0000 mL | Freq: Every day | ORAL | Status: DC | PRN

## 2023-10-31 MED ORDER — ROSUVASTATIN CALCIUM 10 MG PO TABS
20.0000 mg | ORAL_TABLET | ORAL | Status: DC
  Filled 2023-10-31: qty 2

## 2023-10-31 MED ORDER — LACTATED RINGERS IV BOLUS
1000.0000 mL | Freq: Once | INTRAVENOUS | Status: AC
  Administered 2023-10-31: 1000 mL via INTRAVENOUS

## 2023-10-31 MED ORDER — PANTOPRAZOLE SODIUM 40 MG PO TBEC
40.0000 mg | DELAYED_RELEASE_TABLET | Freq: Every day | ORAL | Status: DC
Start: 2023-10-31 — End: 2023-11-02
  Administered 2023-10-31 – 2023-11-02 (×2): 40 mg via ORAL
  Filled 2023-10-31 (×2): qty 1

## 2023-10-31 MED ORDER — SODIUM CHLORIDE 0.9 % IV SOLN: INTRAVENOUS | Status: DC

## 2023-10-31 MED ORDER — POTASSIUM CHLORIDE 20 MEQ PO PACK
40.0000 meq | PACK | Freq: Once | ORAL | Status: AC
  Administered 2023-10-31: 40 meq via ORAL
  Filled 2023-10-31: qty 2

## 2023-10-31 MED ORDER — ALBUTEROL SULFATE HFA 108 (90 BASE) MCG/ACT IN AERS: 1.0000 | INHALATION_SPRAY | RESPIRATORY_TRACT | Status: DC | PRN

## 2023-10-31 MED ORDER — VITAMIN D (ERGOCALCIFEROL) 1.25 MG (50000 UNIT) PO CAPS: 50000.0000 [IU] | ORAL_CAPSULE | ORAL | Status: DC

## 2023-10-31 MED ORDER — LIDOCAINE HCL (CARDIAC) PF 100 MG/5ML IV SOSY
PREFILLED_SYRINGE | INTRAVENOUS | Status: AC
  Filled 2023-10-31: qty 5

## 2023-10-31 MED ORDER — DOCUSATE SODIUM 100 MG PO CAPS
100.0000 mg | ORAL_CAPSULE | Freq: Two times a day (BID) | ORAL | Status: DC | PRN
  Administered 2023-10-31: 100 mg via ORAL
  Filled 2023-10-31: qty 1

## 2023-10-31 MED ORDER — EPINEPHRINE 0.3 MG/0.3ML IJ SOAJ: 0.3000 mg | Freq: Once | INTRAMUSCULAR | Status: DC | PRN

## 2023-10-31 MED ORDER — ALBUTEROL SULFATE (2.5 MG/3ML) 0.083% IN NEBU: 2.5000 mg | INHALATION_SOLUTION | RESPIRATORY_TRACT | Status: DC | PRN

## 2023-10-31 MED ORDER — FERROUS SULFATE 325 (65 FE) MG PO TABS
325.0000 mg | ORAL_TABLET | Freq: Every day | ORAL | Status: DC
  Administered 2023-11-02: 325 mg via ORAL
  Filled 2023-10-31: qty 1

## 2023-10-31 MED ORDER — TRAMADOL HCL 50 MG PO TABS
50.0000 mg | ORAL_TABLET | Freq: Four times a day (QID) | ORAL | Status: DC | PRN
  Administered 2023-11-01 (×2): 50 mg via ORAL
  Filled 2023-10-31 (×2): qty 1

## 2023-10-31 MED ORDER — ACETAMINOPHEN 325 MG PO TABS: 650.0000 mg | ORAL_TABLET | Freq: Four times a day (QID) | ORAL | Status: DC | PRN

## 2023-10-31 MED ORDER — ONDANSETRON HCL 4 MG/2ML IJ SOLN: 4.0000 mg | Freq: Four times a day (QID) | INTRAMUSCULAR | Status: DC | PRN

## 2023-10-31 MED ORDER — ACETAMINOPHEN 650 MG RE SUPP: 650.0000 mg | Freq: Four times a day (QID) | RECTAL | Status: DC | PRN

## 2023-10-31 MED ORDER — ALPRAZOLAM 0.5 MG PO TABS: 0.5000 mg | ORAL_TABLET | Freq: Every evening | ORAL | Status: DC | PRN

## 2023-10-31 MED ORDER — ONDANSETRON HCL 4 MG PO TABS: 4.0000 mg | ORAL_TABLET | Freq: Four times a day (QID) | ORAL | Status: DC | PRN

## 2023-10-31 MED ORDER — LIDOCAINE HCL URETHRAL/MUCOSAL 2 % EX GEL
1.0000 | Freq: Once | CUTANEOUS | Status: AC
  Administered 2023-10-31: 1 via TOPICAL
  Filled 2023-10-31: qty 6

## 2023-10-31 MED ORDER — OXYCODONE-ACETAMINOPHEN 5-325 MG PO TABS
1.0000 | ORAL_TABLET | Freq: Once | ORAL | Status: AC
  Administered 2023-10-31: 1 via ORAL
  Filled 2023-10-31: qty 1

## 2023-10-31 NOTE — ED Triage Notes (Signed)
 Pt sts that she has not had any BM in the last five days. Pt sts that she has been taking OTC meds with no help.

## 2023-10-31 NOTE — Assessment & Plan Note (Signed)
 repleted   Serial BMP

## 2023-10-31 NOTE — Assessment & Plan Note (Signed)
 Will continue PPI therapy.

## 2023-10-31 NOTE — Assessment & Plan Note (Signed)
-   The patient will be hydrated with IV normal saline and will follow BMP. - Will avoid nephrotoxins.

## 2023-10-31 NOTE — ED Provider Notes (Addendum)
 [facility] Provider Note    Event Date/Time   First MD Initiated Contact with Patient 10/31/23 1841     (approximate)   History   Chief Complaint Constipation   HPI  Anna [name] is a 78 y.o. female with past medical history of hypertension, hyperlipidemia, GERD, and WPW who presents to the ED complaining of constipation.  Patient reports that she has not been able to have a bowel movement in the past 5 days, has been straining for bowel movement significantly during this time.  She has occasionally felt nauseous but denies any pain in her abdomen.  She does report some pain in her rectal area along with intermittent bleeding, where she reports a history of hemorrhoids.  She has taken a couple of over-the-counter laxatives without relief.     Physical Exam   Triage Vital Signs: ED Triage Vitals [10/31/23 1517]  Encounter Vitals Group     BP 100/60     Girls Systolic BP Percentile      Girls Diastolic BP Percentile      Boys Systolic BP Percentile      Boys Diastolic BP Percentile      Pulse Rate (!) 118     Resp 17     Temp 98.4 F (36.9 C)     Temp Source Oral     SpO2 98 %     Weight      Height      Head Circumference      Peak Flow      Pain Score 6     Pain Loc      Pain Education      Exclude from Growth Chart     Most recent vital signs: Vitals:   10/31/23 1517  BP: 100/60  Pulse: (!) 118  Resp: 17  Temp: 98.4 F (36.9 C)  SpO2: 98%    Constitutional: Alert and oriented. Eyes: Conjunctivae are normal. Head: Atraumatic. Nose: No congestion/rhinnorhea. Mouth/Throat: Mucous membranes are moist.  Cardiovascular: Normal rate, regular rhythm. Grossly normal heart sounds.  2+ radial pulses bilaterally. Respiratory: Normal respiratory effort.  No retractions. Lungs CTAB. Gastrointestinal: Soft and nontender. No distention.  Rectal exam with large nonbleeding hemorrhoids.  Fecal impaction noted with light brown  stool. Musculoskeletal: No lower extremity tenderness nor edema.  Neurologic:  Normal speech and language. No gross focal neurologic deficits are appreciated.    ED Results / Procedures / Treatments   Labs (all labs ordered are listed, but only abnormal results are displayed) Labs Reviewed  CBC - Abnormal; Notable for the following components:      Result Value   Hemoglobin 11.8 (*)    RDW 15.9 (*)    All other components within normal limits  COMPREHENSIVE METABOLIC PANEL WITH GFR - Abnormal; Notable for the following components:   Potassium 3.4 (*)    Glucose, Bld 134 (*)    Creatinine, Ser 1.20 (*)    GFR, Estimated 47 (*)    All other components within normal limits    RADIOLOGY Abdominal x-ray reviewed and interpreted by me with no dilated bowel loops or evidence of obstruction.  PROCEDURES:  ------------------------------------------------------------------------------------------------------------------- Fecal Disimpaction Procedure Note:  Performed by me:  Patient placed in the lateral recumbent position with knees drawn towards chest. Nurse present for patient support. Large amount of hard brown stool removed. No complications during procedure.   ------------------------------------------------------------------------------------------------------------------   Critical Care performed: No  Procedures   MEDICATIONS ORDERED IN ED:  Medications  lidocaine  (XYLOCAINE ) 2 % jelly 1 Application (has no administration in time range)     IMPRESSION / MDM / ASSESSMENT AND PLAN / ED COURSE  I reviewed the triage vital signs and the nursing notes.                              78 y.o. female with past medical history of hypertension, hyperlipidemia, GERD, and WPW who presents to the ED complaining of 5 days of constipation with intermittent rectal bleeding.  Patient's presentation is most consistent with acute presentation with potential threat to life or bodily  function.  Differential diagnosis includes, but is not limited to, constipation, bowel obstruction, fecal impaction, bleeding hemorrhoids, lower GI bleed.  Patient well-appearing and in no acute distress, vital signs are remarkable for tachycardia but otherwise reassuring.  Patient has a benign abdominal exam, has been eating and drinking normally, doubt obstruction.  She does have a fecal impaction that was removed, patient with hemorrhoids and clotted blood but no evidence of active bleeding.  Labs without significant anemia, leukocytosis, electrolyte abnormality, or AKI.  LFTs were also unremarkable.  Patient appropriate for outpatient management and was counseled on bowel regimen for constipation, also referred to general surgery for follow-up on her hemorrhoids.  She was counseled to return to the ED for new or worsening symptoms, patient agrees with plan.  Patient now stating that she feels very dizzy and lightheaded upon standing, does not feel that she can go home by herself or care by herself.  She does seem to be dehydrated with recent decreased oral intake and mild AKI.  We will give IV fluid bolus and patient would benefit from admission to the hospital for hydration and reassessment.  Case discussed with hospitalist for admission.      FINAL CLINICAL IMPRESSION(S) / ED DIAGNOSES   Final diagnoses:  Fecal impaction (HCC)  Constipation, unspecified constipation type  Hemorrhoids, unspecified hemorrhoid type     Rx / DC Orders   ED Discharge Orders     None        Note:  This document was prepared using Dragon voice recognition software and may include unintentional dictation errors.   Willo Dunnings, MD 10/31/23 DARYLL    Willo Dunnings, MD 10/31/23 [phone]

## 2023-10-31 NOTE — H&P (Addendum)
 Anna [name]   PATIENT NAME: Anna [name]    MR#:  [id number]  DATE OF BIRTH:  [birth year]  DATE OF ADMISSION:  10/31/2023  PRIMARY CARE PHYSICIAN: Marylynn Verneita CROME, MD   Patient is coming from: Home  REQUESTING/REFERRING PHYSICIAN: Willo Dunnings, MD  CHIEF COMPLAINT:   Chief Complaint  Patient presents with   Constipation    HISTORY OF PRESENT ILLNESS:  Anna [name] is a 78 y.o. Caucasian female with medical history significant for GERD, anxiety, history of SVT, history of colon cancer status post right hemicolectomy, IBS, essential hypertension, OSA and Lyme disease, who presented to the emergency room with acute onset of lower abdominal discomfort with constipation.  The patient has not had a bowel movement over the last 5 to 6 days.  She admitted to recurrent rectal bleeding over the last 3 days.  No fever or chills.  She has been having nausea without vomiting.  No chest pain or palpitations.  No cough or wheezing or dyspnea.  No other bleeding diathesis.  ED Course: Upon presentation to the emergency room, heart rate was 118 with otherwise normal vital signs.  Labs revealed mild hypokalemia of 3.4 and creatinine 1.2.  CBC showed hemoglobin 11.8 and hematocrit 36.6. EKG as reviewed by me : None Imaging: 1 view abdomen x-ray showed nonobstructive bowel gas pattern with small to moderate volume fecal loading scattered throughout the colon.  The patient was given 1 p.o. Percocet and 1 L bolus of IV lactated ringer .  She had manual disimpaction by the EDP.  She will be admitted to a medical telemetry bed for further evaluation and management. PAST MEDICAL HISTORY:   Past Medical History:  Diagnosis Date   Acid reflux    Allergy  to alpha-gal    Anxiety    Bilateral cataracts    Cancer (HCC)    Basal Cell Carcinoma, Melanoma   CIN I (cervical intraepithelial neoplasia I)    Colon polyps    benign   H/O squamous cell carcinoma excision    multiple areas  on body per  pt   Hemorrhoid    History of basal cell carcinoma (BCC) excision    History of melanoma excision RIGHT LEG  --  2012   history of SVT (supraventricular tachycardia) (HCC) 10/31/2014   resolved   History of Wolff-Parkinson-White (WPW) syndrome    1996  s/p  ablation   Hypertension    IBS (irritable bowel syndrome)    Lyme disease 2005   Mild carotid artery disease (HCC)    per duplex 12-10-2012  bilateral ICA 1-39% stenosis   OsteoArthritis    Sleep apnea    does not use cpap, going back for cpap titration, mild osa per pt   UTI (urinary tract infection)    finished 7 day course on 08-28-2021 all symptoms resolved    PAST SURGICAL HISTORY:   Past Surgical History:  Procedure Laterality Date   BREAST SURGERY Left 1999   BREAST MASS EXCISED, benign   CARDIAC ELECTROPHYSIOLOGY STUDY AND ABLATION  1996   SUCCESSFUL ABLATION OF WOLFE-PARKINSON-WHITE SYNDROME  (NO ISSUES SINCE)   CATARACT EXTRACTION, BILATERAL     CERVICAL BIOPSY  W/ LOOP ELECTRODE EXCISION  2005   dr gottsegen   COLON RESECTION  04/07/2023   COLONOSCOPY     2019 or 2020   COLPOSCOPY     DILATATION & CURETTAGE/HYSTEROSCOPY WITH MYOSURE N/A 09/17/2021   Procedure: DILATATION & CURETTAGE/HYSTEROSCOPY WITH MYOSURE;  Surgeon: Jannis,  Kate Norris, MD;  Location: [facility];  Service: Gynecology;  Laterality: N/A;   HYSTEROSCOPY WITH D & C  02/13/2012   Procedure: DILATATION AND CURETTAGE /HYSTEROSCOPY;  Surgeon: Toribio LITTIE Campanile, MD;  Location: [facility];  Service: Gynecology;  Laterality: N/A;  deficit - 710   LAPAROSCOPIC CHOLECYSTECTOMY  05-16-2005  dr [facility]   melanoma excised     x  3 , 2 on legs and 1 on arm, not the kind  that spreads   SVT-CARDIAC ABLATION     TRANSTHORACIC ECHOCARDIOGRAM  12-10-2012   dr blanca   mild LVH, ef 55-60%/  mild MR/  mild LAE and RAE   TUBAL LIGATION     UPPER GI ENDOSCOPY     VULVECTOMY N/A 07/25/2017   Procedure: WIDE EXCISION  VULVECTOMY;  Surgeon: Devonna Toribio, MD;  Location: [facility];  Service: Gynecology;  Laterality: N/A;    SOCIAL HISTORY:   Social History   Tobacco Use   Smoking status: Never   Smokeless tobacco: Never  Substance Use Topics   Alcohol use: Not Currently    FAMILY HISTORY:   Family History  Problem Relation Age of Onset   Cancer Mother        ESOPHAGEAL CANCER   Diabetes Father    Hyperlipidemia Father    Heart disease Father    Heart attack Father    Heart disease Brother    Breast cancer Paternal Aunt        Age 38's   Hyperlipidemia Paternal Grandmother    Heart disease Paternal Grandmother    Stroke Paternal Grandmother    Diabetes Paternal Grandfather    Hyperlipidemia Paternal Grandfather    Heart disease Paternal Grandfather    Stroke Paternal Grandfather    Cancer Sister        skin   Stroke Maternal Grandmother    Stroke Maternal Grandfather    Diabetes Brother     DRUG ALLERGIES:   Allergies  Allergen Reactions   Iodinated Contrast Media Hypertension    Patient experienced elevated heart rate, hypertension and had to be taken to the ED   Lovenox  [Enoxaparin ] Shortness Of Breath   Alpha-Gal     facial redness, upset stomach   Gluten Meal Dermatitis    Bloating    Latex Other (See Comments)   Milk-Related Compounds     facial flushing,  upset stomach, diarrhea   Shellfish Allergy  Hives   Sulfa Antibiotics Hives, Nausea Only and Other (See Comments)   Trazodone      Severe sleepiness   Azithromycin Diarrhea, Palpitations and Other (See Comments)    Cramping    Shellfish-Derived Products     Other Reaction(s): abdominal pain   Wheat Rash    Stomach pain    REVIEW OF SYSTEMS:   ROS As per history of present illness. All pertinent systems were reviewed above. Constitutional, HEENT, cardiovascular, respiratory, GI, GU, musculoskeletal, neuro, psychiatric, endocrine, integumentary and hematologic systems were reviewed  and are otherwise negative/unremarkable except for positive findings mentioned above in the HPI.   MEDICATIONS AT HOME:   Prior to Admission medications   Medication Sig Start Date End Date Taking? Authorizing Provider  acetaminophen  (TYLENOL ) 500 MG tablet Take 1,000 mg by mouth every 6 (six) hours as needed for moderate pain.    [provider]  albuterol  (VENTOLIN  HFA) 108 (90 Base) MCG/ACT inhaler Inhale 1-2 puffs into the lungs every 4 (four) hours as needed for  wheezing or shortness of breath. 12/18/22   Isadora Hose, MD  ALPRAZolam  (XANAX ) 0.5 MG tablet Take 1 tablet (0.5 mg total) by mouth at bedtime as needed for anxiety or sleep. 08/13/23   Marylynn Verneita CROME, MD  betamethasone  valerate ointment (VALISONE ) 0.1 % Apply 1 Application topically 2 (two) times daily. 09/10/23   Penne Knee, MD  cephALEXin  (KEFLEX ) 500 MG capsule Take 1 capsule (500 mg total) by mouth 4 (four) times daily. 10/13/23   White, Shelba SAUNDERS, NP  diphenhydrAMINE  (BENADRYL ) 12.5 MG/5ML liquid Take 37.5 mg by mouth 4 (four) times daily as needed for allergies.    [provider]  ELIQUIS 5 MG TABS tablet Take 5 mg by mouth 2 (two) times daily.    [provider]  EPINEPHrine  0.3 mg/0.3 mL IJ SOAJ injection Use as directed for severe allergic reaction 06/26/22   Marylynn Verneita CROME, MD  ergocalciferol  (DRISDOL ) 1.25 MG (50000 UT) capsule Take 1 capsule (50,000 Units total) by mouth once a week. 08/17/22   Marylynn Verneita CROME, MD  estradiol  (ESTRACE ) 0.1 MG/GM vaginal cream Apply a pea-sized amount around the opening of the urethra 3 times weekly. 07/29/23   Vaillancourt, Samantha, PA-C  ferrous sulfate  325 (65 FE) MG tablet TAKE 1 TABLET BY MOUTH EVERY DAY WITH FOOD 09/02/23   Marylynn Verneita CROME, MD  hydrocortisone  (ANUSOL -HC) 2.5 % rectal cream Place 1 Application rectally 4 (four) times daily. 06/03/23   Marylynn Verneita CROME, MD  losartan -hydrochlorothiazide (HYZAAR) 50-12.5 MG tablet Take 1 tablet by mouth daily.  06/03/23   Tullo, Teresa L, MD  methocarbamol (ROBAXIN) 500 MG tablet Take 500 mg by mouth every 8 (eight) hours as needed. 08/15/23   [provider]  ondansetron  (ZOFRAN -ODT) 4 MG disintegrating tablet Take 1 tablet (4 mg total) by mouth every 8 (eight) hours as needed for nausea or vomiting. 04/12/23   Arlander Charleston, MD  pantoprazole  (PROTONIX ) 40 MG tablet Take 1 tablet (40 mg total) by mouth daily. 06/03/23   Marylynn Verneita CROME, MD  phenazopyridine  (PYRIDIUM ) 200 MG tablet Take 1 tablet (200 mg total) by mouth 3 (three) times daily as needed for pain. 09/10/23   Penne Knee, MD  rosuvastatin  (CRESTOR ) 20 MG tablet Take 1 tablet (20 mg total) by mouth every other day. 06/03/23   Marylynn Verneita CROME, MD  sodium chloride  (OCEAN) 0.65 % SOLN nasal spray Place 1 spray into both nostrils as needed (dryness).    [provider]  traMADol (ULTRAM) 50 MG tablet Take 50 mg by mouth every 6 (six) hours as needed.    [provider]  Trospium Chloride 60 MG CP24 Take 1 capsule (60 mg total) by mouth daily. 07/30/23   Vaillancourt, Lucie, PA-C      VITAL SIGNS:  Blood pressure (!) 106/50, pulse 78, temperature 97.9 F (36.6 C), temperature source Oral, resp. rate 18, SpO2 97%.  PHYSICAL EXAMINATION:  Physical Exam  GENERAL:  78 y.o.-year-old Caucasian female patient lying in the bed with no acute distress.  EYES: Pupils equal, round, reactive to light and accommodation. No scleral icterus. Extraocular muscles intact.  HEENT: Head atraumatic, normocephalic. Oropharynx and nasopharynx clear.  NECK:  Supple, no jugular venous distention. No thyroid  enlargement, no tenderness.  LUNGS: Normal breath sounds bilaterally, no wheezing, rales,rhonchi or crepitation. No use of accessory muscles of respiration.  CARDIOVASCULAR: Regular rate and rhythm, S1, S2 normal. No murmurs, rubs, or gallops.  ABDOMEN: Soft, nondistended, with.  Mild lower abdominal tenderness without rebound tenderness  guarding or rigidity. Bowel sounds present. No organomegaly or mass.  EXTREMITIES: No pedal edema, cyanosis, or clubbing.  NEUROLOGIC: Cranial nerves II through XII are intact. Muscle strength 5/5 in all extremities. Sensation intact. Gait not checked.  PSYCHIATRIC: The patient is alert and oriented x 3.  Normal affect and good eye contact. SKIN: No obvious rash, lesion, or ulcer.   LABORATORY PANEL:   CBC Recent Labs  Lab 10/31/23 1519  WBC 9.8  HGB 11.8*  HCT 36.6  PLT 346   ------------------------------------------------------------------------------------------------------------------  Chemistries  Recent Labs  Lab 10/31/23 1519  NA 135  K 3.4*  CL 100  CO2 23  GLUCOSE 134*  BUN 18  CREATININE 1.20*  CALCIUM  8.9  AST 23  ALT 16  ALKPHOS 66  BILITOT 0.7   ------------------------------------------------------------------------------------------------------------------  Cardiac Enzymes No results for input(s): TROPONINI in the last 168 hours. ------------------------------------------------------------------------------------------------------------------  RADIOLOGY:  DG Abdomen 1 View Result Date: 10/31/2023 CLINICAL DATA:  constipation EXAM: ABDOMEN - 1 VIEW COMPARISON:  April 12, 2023 FINDINGS: Nonobstructive bowel gas pattern. Small to moderate volume fecal loading scattered throughout the colon. No pneumoperitoneum. No organomegaly or radiopaque calculi. Cholecystectomy clips. No acute fracture or destructive lesion. Multilevel thoracic osteophytosis. Moderate bilateral hip osteoarthritis. IMPRESSION: Nonobstructive bowel gas pattern. Small to moderate volume fecal loading scattered throughout the colon. Electronically Signed   By: Rogelia Myers M.D.   On: 10/31/2023 16:11      IMPRESSION AND PLAN:  Assessment and Plan: * Rectal bleeding - The patient be admitted to a medical telemetry bed. - This could be related to acute hemorrhoidal bleeding.  The  patient has a history of colon cancer though status post right hemicolectomy in January. - Will follow serial hemoglobins and hematocrits. - The patient will be hydrated with IV normal saline. - Will keep her n.p.o. after midnight. - GI consult will be obtained. - I notified Dr. Maryruth about the patient.  Constipation - The patient was disimpacted in the ER. - Will place on hydration with IV normal saline. - Given her rectal bleeding we will only place her on p.o. Colace.  AKI (acute kidney injury) (HCC) - The patient will be hydrated with IV normal saline and will follow BMP. - Will avoid nephrotoxins.  Hypokalemia - Potassium will be replaced.  Anxiety - Will continue Xanax .  GERD without esophagitis - Will continue PPI therapy.  Dyslipidemia - Will continue statin therapy.  Essential hypertension - Will continue antihypertensive therapy while holding off nephrotoxins.   DVT prophylaxis: Lovenox .  Advanced Care Planning:  Code Status: full code.  Family Communication:  The plan of care was discussed in details with the patient (and family). I answered all questions. The patient agreed to proceed with the above mentioned plan. Further management will depend upon hospital course. Disposition Plan: Back to previous home environment Consults called: none.       All the records are reviewed and case discussed with ED provider.  Status is: Inpatient  At the time of the admission, it appears that the appropriate admission status for this patient is inpatient.  This is judged to be reasonable and necessary in order to provide the required intensity of service to ensure the patient's safety given the presenting symptoms, physical exam findings and initial radiographic and laboratory data in the context of comorbid conditions.  The patient requires inpatient status due to high intensity of service, high risk of further deterioration and high frequency of surveillance  required.  I certify that  at the time of admission, it is my clinical judgment that the patient will require inpatient hospital care extending more than 2 midnights.                            Dispo: The patient is from: Home              Anticipated d/c is to: Home              Patient currently is not medically stable to d/c.              Difficult to place patient: No  Madison DELENA Peaches M.D on 10/31/2023 at 11:23 PM  [facility]   From 7 PM-7 AM, contact night-coverage [email or web address]  CC: Primary care physician; Marylynn Verneita CROME, MD

## 2023-10-31 NOTE — Assessment & Plan Note (Signed)
-   The patient was disimpacted in the ER. - Will place on hydration with IV normal saline. - Given her rectal bleeding we will only place her on p.o. Colace.

## 2023-10-31 NOTE — ED Notes (Addendum)
 Came to pts room to update her VS. Pt advised she is not able to leave the hospital yet. Pt advised it is no way I can get up and walk to my car in this condition.:

## 2023-10-31 NOTE — Assessment & Plan Note (Addendum)
-   Will continue antihypertensive therapy while holding off nephrotoxins.

## 2023-10-31 NOTE — ED Provider Triage Note (Signed)
 Emergency Medicine Provider Triage Evaluation Note  Bich Mchaney , a 78 y.o. female  was evaluated in triage.  Pt complains of constipation.  Symptoms started 5 days ago associated her hemorrhoids with heavy bleeding.  Patient states she has not eating or drinking fluids in the last 2 days, she feels weak.  She has previous episodes like these but this is the worst.  Review of Systems  Positive: Negative:   Physical Exam  BP 100/60 (BP Location: Left Arm)   Pulse (!) 118   Temp 98.4 F (36.9 C) (Oral)   Resp 17   SpO2 98% during triage patient was tachycardic Gen:   Awake, no distress  Resp:  Normal effort  MSK:   Moves extremities without difficulty  Other:    Medical Decision Making  Medically screening exam initiated at 3:27 PM.  Appropriate orders placed.  Magenta Schmiesing was informed that the remainder of the evaluation will be completed by another provider, this initial triage assessment does not replace that evaluation, and the importance of remaining in the ED until their evaluation is complete. Patient who presents with history of constipation in the last 5 days associated to weakness, hemorrhoids, rectal bleeding.  Possible dehydration ordered CBC CMP abdominal x-ray    Janit Kast, PA-C 10/31/23 1529

## 2023-10-31 NOTE — Assessment & Plan Note (Signed)
-   Will continue Xanax .

## 2023-10-31 NOTE — Assessment & Plan Note (Addendum)
-   The patient be admitted to a medical telemetry bed. - This could be related to acute hemorrhoidal bleeding.  The patient has a history of colon cancer though status post right hemicolectomy in January. - Will follow serial hemoglobins and hematocrits. - The patient will be hydrated with IV normal saline. - Will keep her n.p.o. after midnight. - GI consult will be obtained. - I notified Dr. Maryruth about the patient.

## 2023-10-31 NOTE — ED Notes (Signed)
 Pt room 218 showed ready, upon getting ready to transport. Dr. Lawence advised he may be changing the pts room. Medic, Delon advised to hold off for a little while longer before transporting.

## 2023-10-31 NOTE — Assessment & Plan Note (Signed)
 Will continue statin therapy

## 2023-11-01 DIAGNOSIS — K625 Hemorrhage of anus and rectum: Secondary | ICD-10-CM | POA: Diagnosis not present

## 2023-11-01 LAB — HEMOGLOBIN AND HEMATOCRIT, BLOOD
HCT: 29.8 % — ABNORMAL LOW (ref 36.0–46.0)
Hemoglobin: 9.4 g/dL — ABNORMAL LOW (ref 12.0–15.0)

## 2023-11-01 LAB — BASIC METABOLIC PANEL WITH GFR
Anion gap: 8 (ref 5–15)
BUN: 15 mg/dL (ref 8–23)
CO2: 28 mmol/L (ref 22–32)
Calcium: 8.7 mg/dL — ABNORMAL LOW (ref 8.9–10.3)
Chloride: 105 mmol/L (ref 98–111)
Creatinine, Ser: 0.9 mg/dL (ref 0.44–1.00)
GFR, Estimated: >60 mL/min (ref >60)
Glucose, Bld: 103 mg/dL — ABNORMAL HIGH (ref 70–99)
Potassium: 4.2 mmol/L (ref 3.5–5.1)
Sodium: 141 mmol/L (ref 135–145)

## 2023-11-01 LAB — CBC
HCT: 29.5 % — ABNORMAL LOW (ref 36.0–46.0)
Hemoglobin: 9.3 g/dL — ABNORMAL LOW (ref 12.0–15.0)
MCH: 29 pg (ref 26.0–34.0)
MCHC: 31.5 g/dL (ref 30.0–36.0)
MCV: 91.9 fL (ref 80.0–100.0)
Platelets: 262 K/uL (ref 150–400)
RBC: 3.21 MIL/uL — ABNORMAL LOW (ref 3.87–5.11)
RDW: 16 % — ABNORMAL HIGH (ref 11.5–15.5)
WBC: 6.1 K/uL (ref 4.0–10.5)
nRBC: 0 % (ref 0.0–0.2)

## 2023-11-01 MED ORDER — HYDROCORTISONE ACETATE 25 MG RE SUPP: 25.0000 mg | Freq: Two times a day (BID) | RECTAL | 1 refills | Status: DC

## 2023-11-01 MED ORDER — PEG 3350-KCL-NA BICARB-NACL 420 G PO SOLR
4000.0000 mL | Freq: Once | ORAL | Status: AC
  Administered 2023-11-01: 4000 mL via ORAL
  Filled 2023-11-01: qty 4000

## 2023-11-01 NOTE — Plan of Care (Signed)

## 2023-11-01 NOTE — Consult Note (Signed)
 Consultation  Referring Provider:   Hospitalist   Admit date: 10/31/2023 Consult date: 11/01/2023         Reason for Consultation: Constipation              HPI:   Anna [name] is a 78 y.o. lady with history of right hemi colectomy in January due to cancerous polyp who presented with fecal impaction. She still is not moving much stool. She notes that she has been in the bed a lot recently due to vertigo so that's probably why she got backed up. She usually takes daily colace. Some hemorrhoidal bleeding when straining.   Past Medical History:  Diagnosis Date   Acid reflux    Allergy  to alpha-gal    Anxiety    Bilateral cataracts    Cancer (HCC)    Basal Cell Carcinoma, Melanoma   CIN I (cervical intraepithelial neoplasia I)    Colon polyps    benign   H/O squamous cell carcinoma excision    multiple areas  on body per pt   Hemorrhoid    History of basal cell carcinoma (BCC) excision    History of melanoma excision RIGHT LEG  --  2012   history of SVT (supraventricular tachycardia) (HCC) 10/31/2014   resolved   History of Wolff-Parkinson-White (WPW) syndrome    1996  s/p  ablation   Hypertension    IBS (irritable bowel syndrome)    Lyme disease 2005   Mild carotid artery disease (HCC)    per duplex 12-10-2012  bilateral ICA 1-39% stenosis   OsteoArthritis    Sleep apnea    does not use cpap, going back for cpap titration, mild osa per pt   UTI (urinary tract infection)    finished 7 day course on 08-28-2021 all symptoms resolved    Past Surgical History:  Procedure Laterality Date   BREAST SURGERY Left 1999   BREAST MASS EXCISED, benign   CARDIAC ELECTROPHYSIOLOGY STUDY AND ABLATION  1996   SUCCESSFUL ABLATION OF WOLFE-PARKINSON-WHITE SYNDROME  (NO ISSUES SINCE)   CATARACT EXTRACTION, BILATERAL     CERVICAL BIOPSY  W/ LOOP ELECTRODE EXCISION  2005   dr gottsegen   COLON RESECTION  04/07/2023   COLONOSCOPY     2019 or 2020   COLPOSCOPY     DILATATION &  CURETTAGE/HYSTEROSCOPY WITH MYOSURE N/A 09/17/2021   Procedure: DILATATION & CURETTAGE/HYSTEROSCOPY WITH MYOSURE;  Surgeon: Jannis Kate Norris, MD;  Location: [facility];  Service: Gynecology;  Laterality: N/A;   HYSTEROSCOPY WITH D & C  02/13/2012   Procedure: DILATATION AND CURETTAGE /HYSTEROSCOPY;  Surgeon: Toribio LITTIE Campanile, MD;  Location: [facility];  Service: Gynecology;  Laterality: N/A;  deficit - 710   LAPAROSCOPIC CHOLECYSTECTOMY  05-16-2005  dr [facility]   melanoma excised     x  3 , 2 on legs and 1 on arm, not the kind  that spreads   SVT-CARDIAC ABLATION     TRANSTHORACIC ECHOCARDIOGRAM  12-10-2012   dr blanca   mild LVH, ef 55-60%/  mild MR/  mild LAE and RAE   TUBAL LIGATION     UPPER GI ENDOSCOPY     VULVECTOMY N/A 07/25/2017   Procedure: WIDE EXCISION VULVECTOMY;  Surgeon: Devonna Toribio, MD;  Location: [facility];  Service: Gynecology;  Laterality: N/A;    Family History  Problem Relation Age of Onset   Cancer Mother        ESOPHAGEAL CANCER  Diabetes Father    Hyperlipidemia Father    Heart disease Father    Heart attack Father    Heart disease Brother    Breast cancer Paternal Aunt        Age 99's   Hyperlipidemia Paternal Grandmother    Heart disease Paternal Grandmother    Stroke Paternal Grandmother    Diabetes Paternal Grandfather    Hyperlipidemia Paternal Grandfather    Heart disease Paternal Grandfather    Stroke Paternal Grandfather    Cancer Sister        skin   Stroke Maternal Grandmother    Stroke Maternal Grandfather    Diabetes Brother     Social History   Tobacco Use   Smoking status: Never   Smokeless tobacco: Never  Vaping Use   Vaping status: Never Used  Substance Use Topics   Alcohol use: Not Currently   Drug use: No    Prior to Admission medications   Medication Sig Start Date End Date Taking? Authorizing Provider  ALPRAZolam  (XANAX ) 0.5 MG tablet Take 1  tablet (0.5 mg total) by mouth at bedtime as needed for anxiety or sleep. 08/13/23  Yes Marylynn Verneita CROME, MD  azelastine  (ASTELIN ) 0.1 % nasal spray Place 1 spray into the nose 2 (two) times daily. 10/26/23  Yes [provider]  ELIQUIS 5 MG TABS tablet Take 5 mg by mouth 2 (two) times daily.   Yes [provider]  meclizine (ANTIVERT) 12.5 MG tablet Take 12.5 mg by mouth 3 (three) times daily as needed for dizziness. 10/26/23 11/05/23 Yes [provider]  methocarbamol (ROBAXIN) 500 MG tablet Take 500 mg by mouth at bedtime. 08/15/23  Yes [provider]  ondansetron  (ZOFRAN -ODT) 4 MG disintegrating tablet Take 1 tablet (4 mg total) by mouth every 8 (eight) hours as needed for nausea or vomiting. 04/12/23  Yes Arlander Charleston, MD  pantoprazole  (PROTONIX ) 40 MG tablet Take 1 tablet (40 mg total) by mouth daily. 06/03/23  Yes Marylynn Verneita CROME, MD  rosuvastatin  (CRESTOR ) 20 MG tablet Take 1 tablet (20 mg total) by mouth every other day. 06/03/23  Yes Marylynn Verneita CROME, MD  acetaminophen  (TYLENOL ) 500 MG tablet Take 1,000 mg by mouth every 6 (six) hours as needed for moderate pain.    [provider]  albuterol  (VENTOLIN  HFA) 108 (90 Base) MCG/ACT inhaler Inhale 1-2 puffs into the lungs every 4 (four) hours as needed for wheezing or shortness of breath. 12/18/22   Isadora Hose, MD  betamethasone  valerate ointment (VALISONE ) 0.1 % Apply 1 Application topically 2 (two) times daily. 09/10/23   Penne Knee, MD  cephALEXin  (KEFLEX ) 500 MG capsule Take 1 capsule (500 mg total) by mouth 4 (four) times daily. Patient not taking: Reported on 10/31/2023 10/13/23   Teresa Shelba SAUNDERS, NP  diphenhydrAMINE  (BENADRYL ) 12.5 MG/5ML liquid Take 37.5 mg by mouth 4 (four) times daily as needed for allergies.    [provider]  EPINEPHrine  0.3 mg/0.3 mL IJ SOAJ injection Use as directed for severe allergic reaction 06/26/22   Marylynn Verneita CROME, MD  ergocalciferol  (DRISDOL ) 1.25 MG (50000  UT) capsule Take 1 capsule (50,000 Units total) by mouth once a week. 08/17/22   Marylynn Verneita CROME, MD  estradiol  (ESTRACE ) 0.1 MG/GM vaginal cream Apply a pea-sized amount around the opening of the urethra 3 times weekly. 07/29/23   Vaillancourt, Samantha, PA-C  ferrous sulfate  325 (65 FE) MG tablet TAKE 1 TABLET BY MOUTH EVERY DAY WITH FOOD 09/02/23  Marylynn Verneita CROME, MD  hydrocortisone  (ANUSOL -HC) 2.5 % rectal cream Place 1 Application rectally 4 (four) times daily. 06/03/23   Tullo, Teresa L, MD  losartan -hydrochlorothiazide (HYZAAR) 50-12.5 MG tablet Take 1 tablet by mouth daily. 06/03/23   Marylynn Verneita CROME, MD  phenazopyridine  (PYRIDIUM ) 200 MG tablet Take 1 tablet (200 mg total) by mouth 3 (three) times daily as needed for pain. Patient not taking: Reported on 10/31/2023 09/10/23   Penne Knee, MD  sodium chloride  (OCEAN) 0.65 % SOLN nasal spray Place 1 spray into both nostrils as needed (dryness).    [provider]  traMADol  (ULTRAM ) 50 MG tablet Take 50 mg by mouth every 6 (six) hours as needed.    [provider]  Trospium Chloride 60 MG CP24 Take 1 capsule (60 mg total) by mouth daily. Patient not taking: Reported on 10/31/2023 07/30/23   Vaillancourt, Samantha, PA-C    Current Facility-Administered Medications  Medication Dose Route Frequency Provider Last Rate Last Admin   0.9 %  sodium chloride  infusion   Intravenous Continuous Mansy, Madison LABOR, MD 100 mL/hr at 11/01/23 0525 Infusion Verify at 11/01/23 0525   acetaminophen  (TYLENOL ) tablet 650 mg  650 mg Oral Q6H PRN Mansy, Jan A, MD       Or   acetaminophen  (TYLENOL ) suppository 650 mg  650 mg Rectal Q6H PRN Mansy, Jan A, MD       albuterol  (PROVENTIL ) (2.5 MG/3ML) 0.083% nebulizer solution 2.5 mg  2.5 mg Nebulization Q4H PRN Mansy, Jan A, MD       ALPRAZolam  (XANAX ) tablet 0.5 mg  0.5 mg Oral QHS PRN Mansy, Jan A, MD       docusate sodium  (COLACE) capsule 100 mg  100 mg Oral BID PRN Mansy, Jan A, MD   100 mg at 10/31/23 2345    EPINEPHrine  (EPI-PEN) injection 0.3 mg  0.3 mg Subcutaneous Once PRN Mansy, Jan A, MD       ferrous sulfate  tablet 325 mg  325 mg Oral Daily Mansy, Jan A, MD       ondansetron  (ZOFRAN ) tablet 4 mg  4 mg Oral Q6H PRN Mansy, Jan A, MD       Or   ondansetron  (ZOFRAN ) injection 4 mg  4 mg Intravenous Q6H PRN Mansy, Jan A, MD       pantoprazole  (PROTONIX ) EC tablet 40 mg  40 mg Oral Daily Mansy, Jan A, MD   40 mg at 10/31/23 2155   rosuvastatin  (CRESTOR ) tablet 20 mg  20 mg Oral QODAY Mansy, Jan A, MD       traMADol  (ULTRAM ) tablet 50 mg  50 mg Oral Q6H PRN Mansy, Jan A, MD       [START ON 11/03/2023] Vitamin D  (Ergocalciferol ) (DRISDOL ) 1.25 MG (50000 UNIT) capsule 50,000 Units  50,000 Units Oral Weekly Mansy, Jan A, MD        Allergies as of 10/31/2023 - Review Complete 10/31/2023  Allergen Reaction Noted   Iodinated contrast media Hypertension 01/15/2016   Lovenox  [enoxaparin ] Shortness Of Breath 09/03/2023   Alpha-gal  01/07/2020   Gluten meal Dermatitis 01/25/2020   Latex Other (See Comments) 12/24/2021   Milk-related compounds  01/07/2020   Shellfish allergy  Hives 07/27/2018   Sulfa antibiotics Hives, Nausea Only, and Other (See Comments) 11/26/2010   Trazodone   09/11/2021   Azithromycin Diarrhea, Palpitations, and Other (See Comments) 11/26/2010   Shellfish-derived products  06/26/2022   Wheat Rash 07/14/2017     Review of Systems:  All systems reviewed and negative except where noted in HPI.  Review of Systems  Constitutional:  Positive for malaise/fatigue. Negative for chills and fever.  Respiratory:  Negative for shortness of breath.   Cardiovascular:  Negative for chest pain.  Gastrointestinal:  Positive for blood in stool and constipation.  Genitourinary:  Negative for dysuria.  Musculoskeletal:  Negative for joint pain.  Skin:  Negative for rash.  Psychiatric/Behavioral:  Negative for substance abuse.   All other systems reviewed and are negative.     Physical  Exam:  Vital signs in last 24 hours: Temp:  [97.8 F (36.6 C)-98.4 F (36.9 C)] 98.1 F (36.7 C) (08/02 0859) Pulse Rate:  [75-118] 79 (08/02 0859) Resp:  [11-18] 16 (08/02 0859) BP: (89-106)/(43-60) 95/51 (08/02 0859) SpO2:  [96 %-100 %] 97 % (08/02 0859) Last BM Date : 10/31/23 General:   Pleasant in NAD Head:  Normocephalic and atraumatic. Eyes:   No icterus.   Conjunctiva pink. Mouth: Mucosa pink moist, no lesions. Neck:  Supple; no masses felt Lungs:  No respiratory distress Abdomen:   Flat, soft, nondistended, nontender Msk:  Symmetrical without gross deformities. Neurologic:  Alert and  oriented x4;  No focal deficits Skin:  Warm, dry, pink without significant lesions or rashes. Psych:  Alert and cooperative. Normal affect.  LAB RESULTS: Recent Labs    10/31/23 1519 10/31/23 2328 11/01/23 0536 11/01/23 1126  WBC 9.8  --  6.1  --   HGB 11.8* 9.3* 9.3* 9.4*  HCT 36.6 28.8* 29.5* 29.8*  PLT 346  --  262  --    BMET Recent Labs    10/31/23 1519 11/01/23 0536  NA 135 141  K 3.4* 4.2  CL 100 105  CO2 23 28  GLUCOSE 134* 103*  BUN 18 15  CREATININE 1.20* 0.90  CALCIUM  8.9 8.7*   LFT Recent Labs    10/31/23 1519  PROT 7.3  ALBUMIN  3.7  AST 23  ALT 16  ALKPHOS 66  BILITOT 0.7   PT/INR No results for input(s): LABPROT, INR in the last 72 hours.  STUDIES: DG Abdomen 1 View Result Date: 10/31/2023 CLINICAL DATA:  constipation EXAM: ABDOMEN - 1 VIEW COMPARISON:  April 12, 2023 FINDINGS: Nonobstructive bowel gas pattern. Small to moderate volume fecal loading scattered throughout the colon. No pneumoperitoneum. No organomegaly or radiopaque calculi. Cholecystectomy clips. No acute fracture or destructive lesion. Multilevel thoracic osteophytosis. Moderate bilateral hip osteoarthritis. IMPRESSION: Nonobstructive bowel gas pattern. Small to moderate volume fecal loading scattered throughout the colon. Electronically Signed   By: Rogelia Myers M.D.   On:  10/31/2023 16:11       Impression / Plan:   79 y.o. lady with history of right hemi colectomy in January due to cancerous polyp who presented with fecal impaction  - will order an enema - agree with golytely prep. Instructed her to just sip on it until her bowels are moving. - IV hydration - will need aggressive bowel regimen for when she goes home - will continue to follow  Please call with any questions or concerns.  Frederic Schick MD, MPH [facility]

## 2023-11-01 NOTE — Plan of Care (Signed)
   Problem: Education: Goal: Knowledge of General Education information will improve Description Including pain rating scale, medication(s)/side effects and non-pharmacologic comfort measures Outcome: Progressing

## 2023-11-01 NOTE — Care Management Obs Status (Signed)
 MEDICARE OBSERVATION STATUS NOTIFICATION   Patient Details  Name: Ashlynd Michna MRN: [id number] Date of Birth: 02/09/1946   Medicare Observation Status Notification Given:  Yes    [place] L Jemaine Prokop, LCSW 11/01/2023, 5:37 PM

## 2023-11-01 NOTE — Evaluation (Signed)
 Physical Therapy Evaluation Patient Details Name: Anna [name] MRN: [id number] DOB: January 21, 1946 Today's Date: 11/01/2023  History of Present Illness  Anna [name] is a 78 y.o. Caucasian female with medical history significant for GERD, anxiety, history of SVT, history of colon cancer status post right hemicolectomy, IBS, essential hypertension, OSA and Lyme disease, who presented to the emergency room with acute onset of lower abdominal discomfort with constipation.   Clinical Impression  Patient received sitting edge of bed. She reports abdomen pain. Tried to go to the bathroom. Patient is agreeable to PT assessment. She stands with supervision. Ambulated around the nurses station x 1 with supervision and RW. Patient will continue to benefit from skilled PT while here to improve mobility and independence.         If plan is discharge home, recommend the following: A little help with walking and/or transfers;A little help with bathing/dressing/bathroom   Can travel by private vehicle    yes    Equipment Recommendations None recommended by PT  Recommendations for Other Services       Functional Status Assessment Patient has had a recent decline in their functional status and demonstrates the ability to make significant improvements in function in a reasonable and predictable amount of time.     Precautions / Restrictions Precautions Precaution/Restrictions Comments: mod fall Restrictions Weight Bearing Restrictions Per Provider Order: No      Mobility  Bed Mobility Overal bed mobility: Independent                  Transfers Overall transfer level: Needs assistance Equipment used: Rolling walker (2 wheels) Transfers: Sit to/from Stand Sit to Stand: Supervision                Ambulation/Gait Ambulation/Gait assistance: Supervision Gait Distance (Feet): 150 Feet Assistive device: Rolling walker (2 wheels) Gait Pattern/deviations: Step-through pattern,  Trunk flexed Gait velocity: decr     General Gait Details: patient ambulated around nursing station with RW and supervision.  Stairs            Wheelchair Mobility     Tilt Bed    Modified Rankin (Stroke Patients Only)       Balance Overall balance assessment: Modified Independent                                           Pertinent Vitals/Pain Pain Assessment Pain Assessment: Faces Faces Pain Scale: Hurts a little bit Pain Location: abdomen Pain Descriptors / Indicators: Discomfort, Sore    Home Living Family/patient expects to be discharged to:: Private residence Living Arrangements: Alone Available Help at Discharge: Family;Available PRN/intermittently Type of Home: House         Home Layout: One level Home Equipment: None      Prior Function Prior Level of Function : Independent/Modified Independent;Driving;Working/employed                     Extremity/Trunk Assessment   Upper Extremity Assessment Upper Extremity Assessment: Overall WFL for tasks assessed    Lower Extremity Assessment Lower Extremity Assessment: Generalized weakness    Cervical / Trunk Assessment Cervical / Trunk Assessment: Normal  Communication   Communication Communication: No apparent difficulties    Cognition Arousal: Alert Behavior During Therapy: WFL for tasks assessed/performed   PT - Cognitive impairments: No apparent impairments  Following commands: Intact       Cueing Cueing Techniques: Verbal cues     General Comments      Exercises     Assessment/Plan    PT Assessment Patient needs continued PT services  PT Problem List Decreased strength;Decreased activity tolerance;Decreased mobility;Pain       PT Treatment Interventions DME instruction;Gait training;Stair training;Functional mobility training;Therapeutic activities;Therapeutic exercise;Patient/family education    PT Goals  (Current goals can be found in the Care Plan section)  Acute Rehab PT Goals Patient Stated Goal: improve, return home PT Goal Formulation: With patient Time For Goal Achievement: 11/15/23 Potential to Achieve Goals: Good    Frequency Min 2X/week     Co-evaluation               AM-PAC PT 6 Clicks Mobility  Outcome Measure Help needed turning from your back to your side while in a flat bed without using bedrails?: None Help needed moving from lying on your back to sitting on the side of a flat bed without using bedrails?: None Help needed moving to and from a bed to a chair (including a wheelchair)?: A Little Help needed standing up from a chair using your arms (e.g., wheelchair or bedside chair)?: A Little Help needed to walk in hospital room?: A Little Help needed climbing 3-5 steps with a railing? : A Little 6 Click Score: 20    End of Session   Activity Tolerance: Patient tolerated treatment well Patient left: in bed;with call bell/phone within reach Nurse Communication: Mobility status PT Visit Diagnosis: Other abnormalities of gait and mobility (R26.89);Muscle weakness (generalized) (M62.81);Pain Pain - part of body:  (abdomen)    Time: 8650-8641 PT Time Calculation (min) (ACUTE ONLY): 9 min   Charges:   PT Evaluation $PT Eval Low Complexity: 1 Low   PT General Charges $$ ACUTE PT VISIT: 1 Visit        Yahmir Sokolov, PT, GCS 11/01/23,2:43 PM

## 2023-11-01 NOTE — Discharge Summary (Signed)
 Anna [name] FMW:[id number] DOB: [birth year] DOA: 10/31/2023  PCP: Marylynn Verneita CROME, MD  Admit date: 10/31/2023 Discharge date: 11/02/2023  Time spent: 35 minutes  Recommendations for Outpatient Follow-up:  Pcp f/u  GI f/u   Discharge Diagnoses:  Principal Problem:   Rectal bleeding Active Problems:   Constipation   AKI (acute kidney injury) (HCC)   Hypokalemia   Irritable bowel syndrome with constipation   Essential hypertension   OSA (obstructive sleep apnea)   Adenocarcinoma of colon (HCC)   History of Wolff-Parkinson-White (WPW) syndrome   Superior mesenteric artery thrombosis (HCC)   GI bleeding   Dyslipidemia   GERD without esophagitis   Anxiety   Discharge Condition: stable  Diet recommendation: high fiber  There were no vitals filed for this visit.  History of present illness:  From admission h and p  Anna [name] is a 78 y.o. Caucasian female with medical history significant for GERD, anxiety, history of SVT, history of colon cancer status post right hemicolectomy, IBS, essential hypertension, OSA and Lyme disease, who presented to the emergency room with acute onset of lower abdominal discomfort with constipation.  The patient has not had a bowel movement over the last 5 to 6 days.  She admitted to recurrent rectal bleeding over the last 3 days.  No fever or chills.  She has been having nausea without vomiting.  No chest pain or palpitations.  No cough or wheezing or dyspnea.  No other bleeding diathesis.   Hospital Course:   Hx right hemicolectomy in January for colon cancer, also a-fib on apixaban, patient presents with with abdominal discomfort, 5-6 days constipation and straining when attempting to defecate, with rectal bleeding as well in setting of long history of hemorrhoids. Disempacted in the ER, KUB (not clear if performed before or after disempaction) shows mild to moderate stool burden. Hgb stable here in the 9s, evaluated by GI, bleeding thought to  be hemorrhoidal. Started on golytely per patient request (declined other interventions including enema) and has since stooled, though only finished about a third of the jug of golytely. Abdominal discomfort improving. Evaluated by PT, no home health needs identified. Initial plan to discharge patient on hospital day 1 given stability in hemoglobin, mild/moderate stool burden on KUB, and having stooled once since disempaction, but patient insisted on an additional night for monitoring. Patient and hemoglobin again stable on hospital day 2, safe for discharge. Advised to continue golytely and to f/u with her gastroneterologist, has declined referral to gen surg for hemorrhoidectomy. BPs here low normal so advise holding home antihypertensives pending PCP hospital f/u. Given stability in hemoglobin advised safe to resume apixaban - patient says she will confer with her hematologist and make a decision after that.   Procedures: Fecal disempaction in the ER   Consultations: GI  Discharge Exam: Vitals:   11/01/23 1951 11/02/23 0518  BP: (!) 117/52 (!) 91/58  Pulse: 89 80  Resp: 17 16  Temp: 98.3 F (36.8 C) 97.9 F (36.6 C)  SpO2: 96% 97%    General: NAD Cardiovascular: RRR Respiratory: normal WOB  Discharge Instructions   Discharge Instructions     Diet - low sodium heart healthy   Complete by: As directed    Discharge patient   Complete by: As directed    Discharge disposition: 01-Home or Self Care   Discharge patient date: 11/02/2023   Increase activity slowly   Complete by: As directed       Allergies as of 11/02/2023  Reactions   Iodinated Contrast Media Hypertension   Patient experienced elevated heart rate, hypertension and had to be taken to the ED   Lovenox  [enoxaparin ] Shortness Of Breath   Alpha-gal    facial redness, upset stomach   Gluten Meal Dermatitis   Bloating    Latex Other (See Comments)   Milk-related Compounds    facial flushing,  upset stomach,  diarrhea   Shellfish Allergy  Hives   Sulfa Antibiotics Hives, Nausea Only, Other (See Comments)   Trazodone     Severe sleepiness   Azithromycin Diarrhea, Palpitations, Other (See Comments)   Cramping    Shellfish-derived Products    Other Reaction(s): abdominal pain   Wheat Rash   Stomach pain        Medication List     PAUSE taking these medications    Eliquis 5 MG Tabs tablet Wait to take this until your doctor or other care provider tells you to start again. Generic drug: apixaban Take 5 mg by mouth 2 (two) times daily.   losartan -hydrochlorothiazide 50-12.5 MG tablet Wait to take this until your doctor or other care provider tells you to start again. Commonly known as: HYZAAR Take 1 tablet by mouth daily.       STOP taking these medications    cephALEXin  500 MG capsule Commonly known as: KEFLEX        TAKE these medications    acetaminophen  500 MG tablet Commonly known as: TYLENOL  Take 1,000 mg by mouth every 6 (six) hours as needed for moderate pain.   albuterol  108 (90 Base) MCG/ACT inhaler Commonly known as: VENTOLIN  HFA Inhale 1-2 puffs into the lungs every 4 (four) hours as needed for wheezing or shortness of breath.   ALPRAZolam  0.5 MG tablet Commonly known as: XANAX  Take 1 tablet (0.5 mg total) by mouth at bedtime as needed for anxiety or sleep.   azelastine  0.1 % nasal spray Commonly known as: ASTELIN  Place 1 spray into the nose 2 (two) times daily.   betamethasone  valerate ointment 0.1 % Commonly known as: VALISONE  Apply 1 Application topically 2 (two) times daily.   diphenhydrAMINE  12.5 MG/5ML liquid Commonly known as: BENADRYL  Take 37.5 mg by mouth 4 (four) times daily as needed for allergies.   EPINEPHrine  0.3 mg/0.3 mL Soaj injection Commonly known as: EPI-PEN Use as directed for severe allergic reaction   ergocalciferol  1.25 MG (50000 UT) capsule Commonly known as: Drisdol  Take 1 capsule (50,000 Units total) by mouth once a  week.   estradiol  0.1 MG/GM vaginal cream Commonly known as: ESTRACE  Apply a pea-sized amount around the opening of the urethra 3 times weekly.   ferrous sulfate  325 (65 FE) MG tablet TAKE 1 TABLET BY MOUTH EVERY DAY WITH FOOD   hydrocortisone  2.5 % rectal cream Commonly known as: ANUSOL -HC Place 1 Application rectally 4 (four) times daily.   hydrocortisone  25 MG suppository Commonly known as: ANUSOL -HC Place 1 suppository (25 mg total) rectally 2 (two) times daily.   meclizine 12.5 MG tablet Commonly known as: ANTIVERT Take 12.5 mg by mouth 3 (three) times daily as needed for dizziness.   methocarbamol 500 MG tablet Commonly known as: ROBAXIN Take 500 mg by mouth at bedtime.   ondansetron  4 MG disintegrating tablet Commonly known as: ZOFRAN -ODT Take 1 tablet (4 mg total) by mouth every 8 (eight) hours as needed for nausea or vomiting.   pantoprazole  40 MG tablet Commonly known as: PROTONIX  Take 1 tablet (40 mg total) by mouth daily.   phenazopyridine  200  MG tablet Commonly known as: Pyridium  Take 1 tablet (200 mg total) by mouth 3 (three) times daily as needed for pain.   rosuvastatin  20 MG tablet Commonly known as: CRESTOR  Take 1 tablet (20 mg total) by mouth every other day.   sodium chloride  0.65 % Soln nasal spray Commonly known as: OCEAN Place 1 spray into both nostrils as needed (dryness).   traMADol  50 MG tablet Commonly known as: ULTRAM  Take 50 mg by mouth every 6 (six) hours as needed.   Trospium Chloride 60 MG Cp24 Take 1 capsule (60 mg total) by mouth daily.       Allergies  Allergen Reactions   Iodinated Contrast Media Hypertension    Patient experienced elevated heart rate, hypertension and had to be taken to the ED   Lovenox  [Enoxaparin ] Shortness Of Breath   Alpha-Gal     facial redness, upset stomach   Gluten Meal Dermatitis    Bloating    Latex Other (See Comments)   Milk-Related Compounds     facial flushing,  upset stomach,  diarrhea   Shellfish Allergy  Hives   Sulfa Antibiotics Hives, Nausea Only and Other (See Comments)   Trazodone      Severe sleepiness   Azithromycin Diarrhea, Palpitations and Other (See Comments)    Cramping    Shellfish-Derived Products     Other Reaction(s): abdominal pain   Wheat Rash    Stomach pain    Follow-up Information     Marylynn Verneita CROME, MD. Schedule an appointment as soon as possible for a visit .   Specialty: Internal Medicine Contact information: [address] [place] [address] [phone]         Your gastroenterologist. Schedule an appointment as soon as possible for a visit.                   The results of significant diagnostics from this hospitalization (including imaging, microbiology, ancillary and laboratory) are listed below for reference.    Significant Diagnostic Studies: DG Abdomen 1 View Result Date: 10/31/2023 CLINICAL DATA:  constipation EXAM: ABDOMEN - 1 VIEW COMPARISON:  April 12, 2023 FINDINGS: Nonobstructive bowel gas pattern. Small to moderate volume fecal loading scattered throughout the colon. No pneumoperitoneum. No organomegaly or radiopaque calculi. Cholecystectomy clips. No acute fracture or destructive lesion. Multilevel thoracic osteophytosis. Moderate bilateral hip osteoarthritis. IMPRESSION: Nonobstructive bowel gas pattern. Small to moderate volume fecal loading scattered throughout the colon. Electronically Signed   By: Rogelia Myers M.D.   On: 10/31/2023 16:11    Microbiology: No results found for this or any previous visit (from the past 240 hours).   Labs: Basic Metabolic Panel: Recent Labs  Lab 10/31/23 1519 11/01/23 0536  NA 135 141  K 3.4* 4.2  CL 100 105  CO2 23 28  GLUCOSE 134* 103*  BUN 18 15  CREATININE 1.20* 0.90  CALCIUM  8.9 8.7*   Liver Function Tests: Recent Labs  Lab 10/31/23 1519  AST 23  ALT 16  ALKPHOS 66  BILITOT 0.7  PROT 7.3  ALBUMIN  3.7   No results for  input(s): LIPASE, AMYLASE in the last 168 hours. No results for input(s): AMMONIA in the last 168 hours. CBC: Recent Labs  Lab 10/31/23 1519 10/31/23 2328 11/01/23 0536 11/01/23 1126 11/02/23 0604  WBC 9.8  --  6.1  --  6.8  HGB 11.8* 9.3* 9.3* 9.4* 9.4*  HCT 36.6 28.8* 29.5* 29.8* 30.0*  MCV 91.3  --  91.9  --  93.2  PLT 346  --  262  --  257   Cardiac Enzymes: No results for input(s): CKTOTAL, CKMB, CKMBINDEX, TROPONINI in the last 168 hours. BNP: BNP (last 3 results) No results for input(s): BNP in the last 8760 hours.  ProBNP (last 3 results) No results for input(s): PROBNP in the last 8760 hours.  CBG: No results for input(s): GLUCAP in the last 168 hours.     Signed:  Devaughn KATHEE Ban MD.  [facility] 11/02/2023, 9:01 AM

## 2023-11-01 NOTE — Progress Notes (Signed)
 PROGRESS NOTE    Roena Sassaman  FMW:[id number] DOB: [birth year] DOA: 10/31/2023 PCP: Marylynn Verneita CROME, MD  Outpatient Specialists: GI    Brief Narrative:   From admission h and p  Arlis Everly is a 78 y.o. Caucasian female with medical history significant for GERD, anxiety, history of SVT, history of colon cancer status post right hemicolectomy, IBS, essential hypertension, OSA and Lyme disease, who presented to the emergency room with acute onset of lower abdominal discomfort with constipation.  The patient has not had a bowel movement over the last 5 to 6 days.  She admitted to recurrent rectal bleeding over the last 3 days.  No fever or chills.  She has been having nausea without vomiting.  No chest pain or palpitations.  No cough or wheezing or dyspnea.  No other bleeding diathesis.     Assessment & Plan:   Principal Problem:   Rectal bleeding Active Problems:   Constipation   AKI (acute kidney injury) (HCC)   Hypokalemia   Irritable bowel syndrome with constipation   Essential hypertension   OSA (obstructive sleep apnea)   Adenocarcinoma of colon (HCC)   History of Wolff-Parkinson-White (WPW) syndrome   Superior mesenteric artery thrombosis (HCC)   GI bleeding   Dyslipidemia   GERD without esophagitis   Anxiety  # Constipation # Hemorrhoidal bleeding Long hx hemorrhroids. 5-6 days no stool. S/p disempaction in ER. Mild/mod volume of stool on kub. Hgb stable in 9s from baseline of 10s. Started golytely today and has had one BM. GI has seen. Stable for discharge. Multiple patient visits to patient's room today. She is not comfortable with discharge. She is medically stable for discharge. I have spent a significant amount of time attempting to compassionately communicate this to her, to listen to her concerns, etc. I have agreed to one additional night of observation but if no significant changes tomorrow will plan on d/c then. - continue golytely - continue clears -  cbc in AM  # Lightheadedness Patient reports this mainly happens after straining while attempting to stool. PT has evaluated, no home health needs identified. Have advised to avoid straining.  # A-fib, paroxysmal - hold apixaban  # GAD - cont home xanax   # HTN Bp low normal here - home antihypertensives on hold   DVT prophylaxis: SCDs Code Status: full Family Communication: none at bedside  Level of care: Telemetry Medical Status is: Observation     Consultants:  GI  Procedures: none  Antimicrobials:  none    Subjective: Reports small BM this afternoon, abdominal discomfort improving  Objective: Vitals:   11/01/23 0526 11/01/23 0600 11/01/23 0859 11/01/23 1550  BP:   (!) 95/51 120/64  Pulse:   79 81  Resp: 11 12 16 18   Temp:   98.1 F (36.7 C) 98 F (36.7 C)  TempSrc:   Oral Oral  SpO2:   97% 100%    Intake/Output Summary (Last 24 hours) at 11/01/2023 1756 Last data filed at 11/01/2023 0525 Gross per 24 hour  Intake 820.89 ml  Output --  Net 820.89 ml   There were no vitals filed for this visit.  Examination:  General exam: Appears calm and comfortable  Respiratory system: Clear to auscultation. Respiratory effort normal. Cardiovascular system: S1 & S2 heard, RRR. No JVD, murmurs, rubs, gallops or clicks. No pedal edema. Gastrointestinal system: Abdomen is mildly distended, soft and nontender.   Central nervous system: Alert and oriented. No focal neurological deficits. Extremities: Symmetric 5  x 5 power. Skin: No rashes, lesions or ulcers Psychiatry: Judgement and insight appear normal. Mood & affect appropriate.     Data Reviewed: I have personally reviewed following labs and imaging studies  CBC: Recent Labs  Lab 10/31/23 1519 10/31/23 2328 11/01/23 0536 11/01/23 1126  WBC 9.8  --  6.1  --   HGB 11.8* 9.3* 9.3* 9.4*  HCT 36.6 28.8* 29.5* 29.8*  MCV 91.3  --  91.9  --   PLT 346  --  262  --    Basic Metabolic Panel: Recent Labs   Lab 10/31/23 1519 11/01/23 0536  NA 135 141  K 3.4* 4.2  CL 100 105  CO2 23 28  GLUCOSE 134* 103*  BUN 18 15  CREATININE 1.20* 0.90  CALCIUM  8.9 8.7*   GFR: CrCl cannot be calculated (Unknown ideal weight.). Liver Function Tests: Recent Labs  Lab 10/31/23 1519  AST 23  ALT 16  ALKPHOS 66  BILITOT 0.7  PROT 7.3  ALBUMIN  3.7   No results for input(s): LIPASE, AMYLASE in the last 168 hours. No results for input(s): AMMONIA in the last 168 hours. Coagulation Profile: No results for input(s): INR, PROTIME in the last 168 hours. Cardiac Enzymes: No results for input(s): CKTOTAL, CKMB, CKMBINDEX, TROPONINI in the last 168 hours. BNP (last 3 results) No results for input(s): PROBNP in the last 8760 hours. HbA1C: No results for input(s): HGBA1C in the last 72 hours. CBG: No results for input(s): GLUCAP in the last 168 hours. Lipid Profile: No results for input(s): CHOL, HDL, LDLCALC, TRIG, CHOLHDL, LDLDIRECT in the last 72 hours. Thyroid  Function Tests: No results for input(s): TSH, T4TOTAL, FREET4, T3FREE, THYROIDAB in the last 72 hours. Anemia Panel: No results for input(s): VITAMINB12, FOLATE, FERRITIN, TIBC, IRON , RETICCTPCT in the last 72 hours. Urine analysis:    Component Value Date/Time   COLORURINE YELLOW 08/14/2022 1500   APPEARANCEUR Clear 09/10/2023 1101   LABSPEC <=1.005 (A) 08/14/2022 1500   LABSPEC 1.008 01/27/2012 1716   PHURINE 6.0 08/14/2022 1500   GLUCOSEU Negative 09/10/2023 1101   GLUCOSEU NEGATIVE 08/14/2022 1500   HGBUR NEGATIVE 08/14/2022 1500   BILIRUBINUR negative 10/13/2023 1116   BILIRUBINUR Negative 09/10/2023 1101   BILIRUBINUR Negative 01/27/2012 1716   KETONESUR negative 10/13/2023 1116   KETONESUR NEGATIVE 08/14/2022 1500   PROTEINUR negative 10/13/2023 1116   PROTEINUR Negative 09/10/2023 1101   PROTEINUR NEGATIVE 04/06/2021 1444   UROBILINOGEN 0.2 10/13/2023 1116    UROBILINOGEN 0.2 08/14/2022 1500   NITRITE Negative 10/13/2023 1116   NITRITE Negative 09/10/2023 1101   NITRITE NEGATIVE 08/14/2022 1500   LEUKOCYTESUR Trace (A) 10/13/2023 1116   LEUKOCYTESUR Negative 09/10/2023 1101   LEUKOCYTESUR TRACE (A) 08/14/2022 1500   LEUKOCYTESUR Trace 01/27/2012 1716   Sepsis Labs: @LABRCNTIP (procalcitonin:4,lacticidven:4)  )No results found for this or any previous visit (from the past 240 hours).       Radiology Studies: DG Abdomen 1 View Result Date: 10/31/2023 CLINICAL DATA:  constipation EXAM: ABDOMEN - 1 VIEW COMPARISON:  April 12, 2023 FINDINGS: Nonobstructive bowel gas pattern. Small to moderate volume fecal loading scattered throughout the colon. No pneumoperitoneum. No organomegaly or radiopaque calculi. Cholecystectomy clips. No acute fracture or destructive lesion. Multilevel thoracic osteophytosis. Moderate bilateral hip osteoarthritis. IMPRESSION: Nonobstructive bowel gas pattern. Small to moderate volume fecal loading scattered throughout the colon. Electronically Signed   By: Rogelia Myers M.D.   On: 10/31/2023 16:11        Scheduled Meds:  ferrous sulfate   325 mg Oral Daily   pantoprazole   40 mg Oral Daily   rosuvastatin   20 mg Oral QODAY   [START ON 11/03/2023] Vitamin D  (Ergocalciferol )  50,000 Units Oral Weekly   Continuous Infusions:   LOS: 1 day     Devaughn KATHEE Ban, MD [facility]   If 7PM-7AM, please contact night-coverage [email or web address] Password [facility] 11/01/2023, 5:56 PM

## 2023-11-01 NOTE — Care Management CC44 (Signed)
 Condition Code 44 Documentation Completed  Patient Details  Name: Anna [name] MRN: [id number] Date of Birth: July 03, 1945   Condition Code 44 given:  Yes Patient signature on Condition Code 44 notice:   (refused) Documentation of 2 MD's agreement:  Yes Code 44 added to claim:  Yes    [place] L Norvell Caswell, LCSW 11/01/2023, 5:37 PM

## 2023-11-01 NOTE — Progress Notes (Signed)
 PT Cancellation Note  Patient Details Name: Anna [name] MRN: [id number] DOB: 08-02-1945   Cancelled Treatment:    Reason Eval/Treat Not Completed: Patient declined, no reason specified Will re-attempt after lunch. Patient in agreement.   Coleston Dirosa 11/01/2023, 11:44 AM

## 2023-11-02 DIAGNOSIS — K625 Hemorrhage of anus and rectum: Secondary | ICD-10-CM | POA: Diagnosis not present

## 2023-11-02 LAB — CBC
HCT: 30 % — ABNORMAL LOW (ref 36.0–46.0)
Hemoglobin: 9.4 g/dL — ABNORMAL LOW (ref 12.0–15.0)
MCH: 29.2 pg (ref 26.0–34.0)
MCHC: 31.3 g/dL (ref 30.0–36.0)
MCV: 93.2 fL (ref 80.0–100.0)
Platelets: 257 K/uL (ref 150–400)
RBC: 3.22 MIL/uL — ABNORMAL LOW (ref 3.87–5.11)
RDW: 15.9 % — ABNORMAL HIGH (ref 11.5–15.5)
WBC: 6.8 K/uL (ref 4.0–10.5)
nRBC: 0 % (ref 0.0–0.2)

## 2023-11-03 ENCOUNTER — Telehealth: Payer: Self-pay

## 2023-11-03 ENCOUNTER — Encounter: Payer: Self-pay | Admitting: Internal Medicine

## 2023-11-03 NOTE — Telephone Encounter (Signed)
Please schedule pt for a hospital follow up

## 2023-11-03 NOTE — Telephone Encounter (Signed)
 Pt has been scheduled for a hospital follow up for 11/21/2023. Would you like for pt to have any labs done before her appt?

## 2023-11-03 NOTE — Transitions of Care (Post Inpatient/ED Visit) (Signed)
 11/03/2023  Name: Anna [name] MRN: [id number] DOB: [birth year]  Today's TOC FU Call Status: Today's TOC FU Call Status:: Successful TOC FU Call Completed TOC FU Call Complete Date: 11/03/23 Patient's Name and Date of Birth confirmed.  Transition Care Management Follow-up Telephone Call Date of Discharge: 11/02/23 Discharge Facility: [facility]) Type of Discharge: Inpatient Admission Primary Inpatient Discharge Diagnosis:: constipation How have you been since you were released from the hospital?: Better Any questions or concerns?: No  Items Reviewed: Did you receive and understand the discharge instructions provided?: Yes Medications obtained,verified, and reconciled?: Yes (Medications Reviewed) Any new allergies since your discharge?: No Dietary orders reviewed?: Yes Do you have support at home?: No  Medications Reviewed Today: Medications Reviewed Today     Reviewed by Emmitt Pan, LPN (Licensed Practical Nurse) on 11/03/23 at 1452  Med List Status: <None>   Medication Order Taking? Sig Documenting Provider Last Dose Status Informant  acetaminophen  (TYLENOL ) 500 MG tablet [id number] Yes Take 1,000 mg by mouth every 6 (six) hours as needed for moderate pain. [provider]  Active Self  albuterol  (VENTOLIN  HFA) 108 (90 Base) MCG/ACT inhaler [id number] Yes Inhale 1-2 puffs into the lungs every 4 (four) hours as needed for wheezing or shortness of breath. Isadora Hose, MD  Active   ALPRAZolam  (XANAX ) 0.5 MG tablet [id number] Yes Take 1 tablet (0.5 mg total) by mouth at bedtime as needed for anxiety or sleep. Marylynn Verneita CROME, MD  Active   azelastine  (ASTELIN ) 0.1 % nasal spray [id number] Yes Place 1 spray into the nose 2 (two) times daily. [provider]  Active   betamethasone  valerate ointment (VALISONE ) 0.1 % [id number] Yes Apply 1 Application topically 2 (two) times daily. Penne Knee, MD  Active   diphenhydrAMINE   (BENADRYL ) 12.5 MG/5ML liquid [id number] Yes Take 37.5 mg by mouth 4 (four) times daily as needed for allergies. [provider]  Active Self  ELIQUIS 5 MG TABS tablet [id number]  Take 5 mg by mouth 2 (two) times daily.  Patient not taking: Reported on 11/03/2023   [provider]  Active   EPINEPHrine  0.3 mg/0.3 mL IJ SOAJ injection [id number] Yes Use as directed for severe allergic reaction Marylynn Verneita CROME, MD  Active   ergocalciferol  (DRISDOL ) 1.25 MG (50000 UT) capsule [id number] Yes Take 1 capsule (50,000 Units total) by mouth once a week. Marylynn Verneita CROME, MD  Active   estradiol  (ESTRACE ) 0.1 MG/GM vaginal cream [id number] Yes Apply a pea-sized amount around the opening of the urethra 3 times weekly. Vaillancourt, Samantha, PA-C  Active   ferrous sulfate  325 (65 FE) MG tablet [id number] Yes TAKE 1 TABLET BY MOUTH EVERY DAY WITH FOOD Marylynn Verneita CROME, MD  Active   hydrocortisone  (ANUSOL -HC) 2.5 % rectal cream [id number] Yes Place 1 Application rectally 4 (four) times daily. Marylynn Verneita CROME, MD  Active            Med Note (LAWS, REGINA C   Wed Sep 03, 2023 10:30 AM) As needed  hydrocortisone  (ANUSOL -HC) 25 MG suppository [id number] Yes Place 1 suppository (25 mg total) rectally 2 (two) times daily. Wouk, Devaughn Sayres, MD  Active   losartan -hydrochlorothiazide (HYZAAR) 50-12.5 MG tablet [id number]  Take 1 tablet by mouth daily.  Patient not taking: Reported on 11/03/2023   Tullo, Teresa L, MD  Active   meclizine (ANTIVERT) 12.5 MG tablet [id number] Yes Take 12.5 mg by mouth 3 (three) times daily as needed for dizziness. [provider]  Active   methocarbamol (ROBAXIN) 500 MG tablet [id number] Yes Take 500 mg by mouth at bedtime. [provider]  Active   ondansetron  (ZOFRAN -ODT) 4 MG disintegrating tablet [id number] Yes Take 1 tablet (4 mg total) by mouth every 8 (eight) hours as needed for nausea or vomiting. Arlander Charleston, MD  Active   pantoprazole  (PROTONIX ) 40 MG tablet  [id number] Yes Take 1 tablet (40 mg total) by mouth daily. Marylynn Verneita CROME, MD  Active   phenazopyridine  (PYRIDIUM ) 200 MG tablet [id number]  Take 1 tablet (200 mg total) by mouth 3 (three) times daily as needed for pain.  Patient not taking: Reported on 11/03/2023   Penne Knee, MD  Active   rosuvastatin  (CRESTOR ) 20 MG tablet [id number] Yes Take 1 tablet (20 mg total) by mouth every other day. Marylynn Verneita CROME, MD  Active   sodium chloride  (OCEAN) 0.65 % SOLN nasal spray [id number] Yes Place 1 spray into both nostrils as needed (dryness). [provider]  Active Self  traMADol  (ULTRAM ) 50 MG tablet [id number] Yes Take 50 mg by mouth every 6 (six) hours as needed. [provider]  Active   Trospium Chloride 60 MG CP24 [id number]  Take 1 capsule (60 mg total) by mouth daily.  Patient not taking: Reported on 11/03/2023   Vaillancourt, Samantha, PA-C  Active             Home Care and Equipment/Supplies: Were Home Health Services Ordered?: NA Any new equipment or medical supplies ordered?: NA  Functional Questionnaire: Do you need assistance with bathing/showering or dressing?: No Do you need assistance with meal preparation?: No Do you need assistance with eating?: No Do you have difficulty maintaining continence: No Do you need assistance with getting out of bed/getting out of a chair/moving?: No Do you have difficulty managing or taking your medications?: No  Follow up appointments reviewed: PCP Follow-up appointment confirmed?: Yes Date of PCP follow-up appointment?: 11/21/23 Follow-up Provider: [facility] Follow-up appointment confirmed?: NA Do you need transportation to your follow-up appointment?: No Do you understand care options if your condition(s) worsen?: Yes-patient verbalized understanding    SIGNATURE Julian Lemmings, LPN [facility] Nurse Health Advisor Direct Dial [phone]

## 2023-11-04 ENCOUNTER — Other Ambulatory Visit: Payer: Self-pay | Admitting: Internal Medicine

## 2023-11-04 DIAGNOSIS — D509 Iron deficiency anemia, unspecified: Secondary | ICD-10-CM

## 2023-11-05 ENCOUNTER — Telehealth: Payer: Self-pay

## 2023-11-05 NOTE — Telephone Encounter (Signed)
 I left voicemail for patient asking her to please call us  back.  I also sent a message to patient via MyChart.  E2C2 - when patient calls back, please let her know that we have her scheduled for a lab visit tomorrow at 3pm and a nurse visit at 3:30pm.  Please let her know that these appointments have been combined, so she may arrive at 3:15pm for check-in for her combined nurse and lab visit.

## 2023-11-06 ENCOUNTER — Ambulatory Visit (INDEPENDENT_AMBULATORY_CARE_PROVIDER_SITE_OTHER)

## 2023-11-06 ENCOUNTER — Other Ambulatory Visit: Admitting: Urology

## 2023-11-06 ENCOUNTER — Other Ambulatory Visit

## 2023-11-06 DIAGNOSIS — D509 Iron deficiency anemia, unspecified: Secondary | ICD-10-CM | POA: Diagnosis not present

## 2023-11-06 DIAGNOSIS — E538 Deficiency of other specified B group vitamins: Secondary | ICD-10-CM

## 2023-11-06 MED ORDER — CYANOCOBALAMIN 1000 MCG/ML IJ SOLN
1000.0000 ug | Freq: Once | INTRAMUSCULAR | Status: AC
  Administered 2023-11-06: 1000 ug via INTRAMUSCULAR

## 2023-11-06 NOTE — Progress Notes (Signed)
 Patient presented for B 12 injection to left deltoid, patient voiced no concerns nor showed any signs of distress during injection.

## 2023-11-07 LAB — CBC WITH DIFFERENTIAL/PLATELET
Basophils Absolute: 0.1 K/uL (ref 0.0–0.1)
Basophils Relative: 1 % (ref 0.0–3.0)
Eosinophils Absolute: 0.2 K/uL (ref 0.0–0.7)
Eosinophils Relative: 3.5 % (ref 0.0–5.0)
HCT: 30.6 % — ABNORMAL LOW (ref 36.0–46.0)
Hemoglobin: 10.1 g/dL — ABNORMAL LOW (ref 12.0–15.0)
Lymphocytes Relative: 29.7 % (ref 12.0–46.0)
Lymphs Abs: 2 K/uL (ref 0.7–4.0)
MCHC: 32.9 g/dL (ref 30.0–36.0)
MCV: 90.5 fl (ref 78.0–100.0)
Monocytes Absolute: 0.3 K/uL (ref 0.1–1.0)
Monocytes Relative: 4.9 % (ref 3.0–12.0)
Neutro Abs: 4.2 K/uL (ref 1.4–7.7)
Neutrophils Relative %: 60.9 % (ref 43.0–77.0)
Platelets: 336 K/uL (ref 150.0–400.0)
RBC: 3.38 Mil/uL — ABNORMAL LOW (ref 3.87–5.11)
RDW: 17.6 % — ABNORMAL HIGH (ref 11.5–15.5)
WBC: 6.9 K/uL (ref 4.0–10.5)

## 2023-11-07 LAB — IBC + FERRITIN
Ferritin: 12.5 ng/mL (ref 10.0–291.0)
Iron: 21 ug/dL — ABNORMAL LOW (ref 42–145)
Saturation Ratios: 6.1 % — ABNORMAL LOW (ref 20.0–50.0)
TIBC: 341.6 ug/dL (ref 250.0–450.0)
Transferrin: 244 mg/dL (ref 212.0–360.0)

## 2023-11-07 LAB — BASIC METABOLIC PANEL WITH GFR
BUN: 17 mg/dL (ref 6–23)
CO2: 28 meq/L (ref 19–32)
Calcium: 8.9 mg/dL (ref 8.4–10.5)
Chloride: 104 meq/L (ref 96–112)
Creatinine, Ser: 1.14 mg/dL (ref 0.40–1.20)
GFR: 46.68 mL/min — ABNORMAL LOW (ref >60.00)
Glucose, Bld: 112 mg/dL — ABNORMAL HIGH (ref 70–99)
Potassium: 4.1 meq/L (ref 3.5–5.1)
Sodium: 143 meq/L (ref 135–145)

## 2023-11-12 ENCOUNTER — Inpatient Hospital Stay: Admitting: Internal Medicine

## 2023-11-14 ENCOUNTER — Ambulatory Visit (INDEPENDENT_AMBULATORY_CARE_PROVIDER_SITE_OTHER): Admitting: Internal Medicine

## 2023-11-14 ENCOUNTER — Encounter: Payer: Self-pay | Admitting: Internal Medicine

## 2023-11-14 VITALS — BP 140/66 | HR 110 | Ht 68.0 in | Wt 186.2 lb

## 2023-11-14 DIAGNOSIS — D699 Hemorrhagic condition, unspecified: Secondary | ICD-10-CM

## 2023-11-14 DIAGNOSIS — F410 Panic disorder [episodic paroxysmal anxiety] without agoraphobia: Secondary | ICD-10-CM

## 2023-11-14 DIAGNOSIS — C189 Malignant neoplasm of colon, unspecified: Secondary | ICD-10-CM

## 2023-11-14 DIAGNOSIS — D513 Other dietary vitamin B12 deficiency anemia: Secondary | ICD-10-CM

## 2023-11-14 DIAGNOSIS — E785 Hyperlipidemia, unspecified: Secondary | ICD-10-CM

## 2023-11-14 DIAGNOSIS — K55069 Acute infarction of intestine, part and extent unspecified: Secondary | ICD-10-CM | POA: Diagnosis not present

## 2023-11-14 DIAGNOSIS — D62 Acute posthemorrhagic anemia: Secondary | ICD-10-CM

## 2023-11-14 DIAGNOSIS — Z09 Encounter for follow-up examination after completed treatment for conditions other than malignant neoplasm: Secondary | ICD-10-CM

## 2023-11-14 DIAGNOSIS — K2100 Gastro-esophageal reflux disease with esophagitis, without bleeding: Secondary | ICD-10-CM | POA: Diagnosis not present

## 2023-11-14 DIAGNOSIS — F411 Generalized anxiety disorder: Secondary | ICD-10-CM

## 2023-11-14 DIAGNOSIS — E559 Vitamin D deficiency, unspecified: Secondary | ICD-10-CM | POA: Diagnosis not present

## 2023-11-14 DIAGNOSIS — I10 Essential (primary) hypertension: Secondary | ICD-10-CM

## 2023-11-14 DIAGNOSIS — R944 Abnormal results of kidney function studies: Secondary | ICD-10-CM

## 2023-11-14 DIAGNOSIS — E876 Hypokalemia: Secondary | ICD-10-CM

## 2023-11-14 MED ORDER — SYRINGE 25G X 1" 3 ML MISC
0 refills | Status: DC
Start: 2023-11-14 — End: 2023-12-12

## 2023-11-14 MED ORDER — ALPRAZOLAM 0.5 MG PO TABS: 0.5000 mg | ORAL_TABLET | Freq: Two times a day (BID) | ORAL | 3 refills | Status: DC | PRN

## 2023-11-14 MED ORDER — CYANOCOBALAMIN 1000 MCG/ML IJ SOLN: 1000.0000 ug | INTRAMUSCULAR | 11 refills | Status: DC

## 2023-11-14 MED ORDER — PANTOPRAZOLE SODIUM 40 MG PO TBEC: 40.0000 mg | DELAYED_RELEASE_TABLET | Freq: Every day | ORAL | 1 refills | Status: AC

## 2023-11-14 NOTE — Patient Instructions (Addendum)
 I RECOMMEND ADDING BENEFIBER ONCE AILY TO YOUR GATORADE  IT WILL HELP TO BULK UP your stools  RESTART START B12  I/M INJECTION WEEKLY AND VITAMIN  D   I have increased the quantity of alprazolam  to allow you to use a second dose NOT DAILY (EXTRA 15.MONTH)

## 2023-11-14 NOTE — Assessment & Plan Note (Signed)
 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 She has now discontinued Lovenox  and is being maintained on Eliquis.

## 2023-11-14 NOTE — Progress Notes (Unsigned)
 Subjective:  Patient ID: Anna [name], female    DOB: December 27, 1945  Age: 78 y.o. MRN: [id number]  CC: There were no encounter diagnoses.   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 Fecal impaction with rectal spasms and profuse bleeding x 3 days ,  went to ER on August 1 and manually disimpacted by ER physician.  She refused to be discharged from ER  bc of she was  bleeding profusely .  She was admitted and treated with colon prep by GI doctor  but process took another  48 hours  for the prep to work .  She was sent home before she was stooling regularly   She is tearful and anxious today .  Feels depleted  and weak.taking iron  for IDA.  Currently having liquid stools immediately post prandially (< 15 mintues after eating) several times daily .  [facility] has seen her today and stools studies  were sent. . Using gatorade for fluid replacement  .  Precipitated by 7 days of bedrest due to persistent vertigo (treated July 27 with azelastine  nasal spray  to resolution) still having daily headache  The eliquis was suspended during hospitalization due to the profuse bleeding and has not been restarted . The liver ultrasound/doppler has been done  an shoes not thrombosis  GAD:  she has tried SSRIs' x 3 did not tolerate any ,  has been using xanax  at night ONLY    Outpatient Medications Prior to Visit  Medication Sig Dispense Refill   acetaminophen  (TYLENOL ) 500 MG tablet Take 1,000 mg by mouth every 6 (six) hours as needed for moderate pain.     albuterol  (VENTOLIN  HFA) 108 (90 Base) MCG/ACT inhaler Inhale 1-2 puffs into the lungs every 4 (four) hours as needed for wheezing or shortness of breath. 8 g 2   ALPRAZolam  (XANAX )  0.5 MG tablet Take 1 tablet (0.5 mg total) by mouth at bedtime as needed for anxiety or sleep. 30 tablet 3   azelastine  (ASTELIN ) 0.1 % nasal spray Place 1 spray into the nose 2 (two) times daily.     betamethasone  valerate ointment (VALISONE ) 0.1 % Apply 1 Application topically 2 (two) times daily. 30 g 0   diphenhydrAMINE  (BENADRYL ) 12.5 MG/5ML liquid Take 37.5 mg by mouth 4 (four) times daily as needed for allergies.     EPINEPHrine  0.3 mg/0.3 mL IJ SOAJ injection Use as directed for severe allergic reaction 2 each 2   ergocalciferol  (DRISDOL ) 1.25 MG (50000 UT) capsule Take 1 capsule (50,000 Units total) by mouth once a week. 12 capsule 3   estradiol  (ESTRACE ) 0.1 MG/GM vaginal cream Apply a pea-sized amount around the opening of the urethra 3 times weekly. 42.5 g 3   ferrous sulfate  325 (65 FE) MG tablet TAKE 1 TABLET BY MOUTH EVERY DAY WITH FOOD 90 tablet 0   hydrocortisone  (ANUSOL -HC) 2.5 % rectal cream Place 1 Application rectally 4 (four) times daily. 30 g 1   hydrocortisone  (ANUSOL -HC) 25 MG suppository Place 1 suppository (25 mg total) rectally 2 (two) times daily. 30 suppository 1   methocarbamol (ROBAXIN) 500 MG tablet Take 500 mg by mouth at bedtime.     ondansetron  (ZOFRAN -ODT) 4 MG disintegrating  tablet Take 1 tablet (4 mg total) by mouth every 8 (eight) hours as needed for nausea or vomiting. 20 tablet 0   pantoprazole  (PROTONIX ) 40 MG tablet Take 1 tablet (40 mg total) by mouth daily. 90 tablet 1   rosuvastatin  (CRESTOR ) 20 MG tablet Take 1 tablet (20 mg total) by mouth every other day. 45 tablet 3   sodium chloride  (OCEAN) 0.65 % SOLN nasal spray Place 1 spray into both nostrils as needed (dryness).     traMADol  (ULTRAM ) 50 MG tablet Take 50 mg by mouth every 6 (six) hours as needed.     ELIQUIS 5 MG TABS tablet Take 5 mg by mouth 2 (two) times daily. (Patient not taking: Reported on 11/14/2023)     losartan -hydrochlorothiazide (HYZAAR) 50-12.5 MG tablet Take 1 tablet by mouth  daily. (Patient not taking: Reported on 11/14/2023) 90 tablet 1   phenazopyridine  (PYRIDIUM ) 200 MG tablet Take 1 tablet (200 mg total) by mouth 3 (three) times daily as needed for pain. (Patient not taking: Reported on 11/14/2023) 15 tablet 0   Trospium Chloride 60 MG CP24 Take 1 capsule (60 mg total) by mouth daily. (Patient not taking: Reported on 11/14/2023) 30 capsule 11   No facility-administered medications prior to visit.    Review of Systems;  Patient denies headache, fevers, malaise, unintentional weight loss, skin rash, eye pain, sinus congestion and sinus pain, sore throat, dysphagia,  hemoptysis , cough, dyspnea, wheezing, chest pain, palpitations, orthopnea, edema, abdominal pain, nausea, melena, diarrhea, constipation, flank pain, dysuria, hematuria, urinary  Frequency, nocturia, numbness, tingling, seizures,  Focal weakness, Loss of consciousness,  Tremor, insomnia, depression, anxiety, and suicidal ideation.      Objective:  BP (!) 140/66   Pulse (!) 110   Ht 5' 8 (1.727 m)   Wt 186 lb 3.2 oz (84.5 kg)   SpO2 99%   BMI 28.31 kg/m   BP Readings from Last 3 Encounters:  11/14/23 (!) 140/66  11/02/23 105/66  10/13/23 (!) 164/82    Wt Readings from Last 3 Encounters:  11/14/23 186 lb 3.2 oz (84.5 kg)  09/03/23 186 lb (84.4 kg)  07/29/23 187 lb (84.8 kg)    Physical Exam  Lab Results  Component Value Date   HGBA1C 6.2 (H) 06/26/2022   HGBA1C 6.3 12/24/2021    Lab Results  Component Value Date   CREATININE 1.14 11/06/2023   CREATININE 0.90 11/01/2023   CREATININE 1.20 (H) 10/31/2023    Lab Results  Component Value Date   WBC 6.9 11/06/2023   HGB 10.1 (L) 11/06/2023   HCT 30.6 (L) 11/06/2023   PLT 336.0 11/06/2023   GLUCOSE 112 (H) 11/06/2023   CHOL 231 (H) 07/29/2023   TRIG 178.0 (H) 07/29/2023   HDL 51.70 07/29/2023   LDLDIRECT 161.0 07/29/2023   LDLCALC 144 (H) 07/29/2023   ALT 16 10/31/2023   AST 23 10/31/2023   NA 143 11/06/2023   K 4.1  11/06/2023   CL 104 11/06/2023   CREATININE 1.14 11/06/2023   BUN 17 11/06/2023   CO2 28 11/06/2023   TSH 1.41 12/24/2021   INR 1.03 12/10/2012   HGBA1C 6.2 (H) 06/26/2022    DG Abdomen 1 View Result Date: 10/31/2023 CLINICAL DATA:  constipation EXAM: ABDOMEN - 1 VIEW COMPARISON:  April 12, 2023 FINDINGS: Nonobstructive bowel gas pattern. Small to moderate volume fecal loading scattered throughout the colon. No pneumoperitoneum. No organomegaly or radiopaque calculi. Cholecystectomy clips. No acute fracture or destructive lesion. Multilevel thoracic osteophytosis. Moderate  bilateral hip osteoarthritis. IMPRESSION: Nonobstructive bowel gas pattern. Small to moderate volume fecal loading scattered throughout the colon. Electronically Signed   By: Rogelia Myers M.D.   On: 10/31/2023 16:11    Assessment & Plan:  .There are no diagnoses linked to this encounter.   I spent 34 minutes on the day of this face to face encounter reviewing patient's  most recent visit with cardiology,  nephrology,  and neurology,  prior relevant surgical and non surgical procedures, recent  labs and imaging studies, counseling on weight management,  reviewing the assessment and plan with patient, and post visit ordering and reviewing of  diagnostics and therapeutics with patient  .   Follow-up: No follow-ups on file.   Verneita LITTIE Kettering, MD

## 2023-11-16 ENCOUNTER — Encounter: Payer: Self-pay | Admitting: Internal Medicine

## 2023-11-17 ENCOUNTER — Ambulatory Visit: Payer: Self-pay | Admitting: Internal Medicine

## 2023-11-17 DIAGNOSIS — D62 Acute posthemorrhagic anemia: Secondary | ICD-10-CM | POA: Insufficient documentation

## 2023-11-17 NOTE — Assessment & Plan Note (Signed)
 She has profuse rectal bleeding due to fecal impaction and concurrent use of Eliquis.  ; hgb dropped one pt.  Will repeat iron  studies ansshe has resumed ironn  Lab Results  Component Value Date   WBC 6.9 11/06/2023   HGB 10.1 (L) 11/06/2023   HCT 30.6 (L) 11/06/2023   MCV 90.5 11/06/2023   PLT 336.0 11/06/2023   Lab Results  Component Value Date   IRON  21 (L) 11/06/2023   TIBC 341.6 11/06/2023   FERRITIN 12.5 11/06/2023

## 2023-11-17 NOTE — Assessment & Plan Note (Addendum)
 10 yr risk of CAD using the AHA risk calculator is 16%.  Patient has  incidental evidence of thoracic and aortic atherosclerosis noted on prior post traumatic CT done in 2022 and is tolerating rosuvastatin  20 mg very other day

## 2023-11-17 NOTE — Assessment & Plan Note (Signed)
 aggravated by recent diagnosis and recent admission for rectal bleeding. eviewed prior attempts to treat with SSRI, which were not tolerated.  Counselled patient  and offered newere SSRI therapy; she declines. Will allow occasional use of extra dose of low dose alprazolam  The risks and benefits of  Chronic  benzodiazepine use were discussed with patient today including increased risk of dementia,  Addiction, and seizures if abruptly withdrawn  .  Patient was encouraged to reduce use of clonazepam gradually,  Starting with reduction of one dose to 1/2 tablet and use buspirone if needed  .

## 2023-11-17 NOTE — Assessment & Plan Note (Addendum)
 Diagnosed after August 2024 colonoscopy.  Ascending colon.  S/p hemicolectomy Jan 2025 at [facility], complicated by  SMV thrombosis

## 2023-11-17 NOTE — Assessment & Plan Note (Signed)
   Lab Results  Component Value Date   NA 143 11/06/2023   K 4.1 11/06/2023   CL 104 11/06/2023   CO2 28 11/06/2023

## 2023-11-17 NOTE — Assessment & Plan Note (Signed)
 Patient is stable post discharge and has no new issues or unanswered questions about discharge plans at the visit today for hospital follow up.  I have reviewed the records from the hospital admission in detail with patient today.

## 2023-11-17 NOTE — Assessment & Plan Note (Addendum)
 Her ARB was suspended during recent admission due to soft pressures and rectal bleeding. She has beenoffo of medicatio nsince August 3.  Will advise her to resume the medication if home readings are > 140/90

## 2023-11-18 NOTE — Telephone Encounter (Signed)
 Anna [name] is checking into the labs to see why they were canceled.

## 2023-11-19 ENCOUNTER — Other Ambulatory Visit: Payer: Self-pay | Admitting: *Deleted

## 2023-11-19 DIAGNOSIS — R944 Abnormal results of kidney function studies: Secondary | ICD-10-CM

## 2023-11-19 DIAGNOSIS — E559 Vitamin D deficiency, unspecified: Secondary | ICD-10-CM

## 2023-11-19 DIAGNOSIS — D699 Hemorrhagic condition, unspecified: Secondary | ICD-10-CM

## 2023-11-19 DIAGNOSIS — D513 Other dietary vitamin B12 deficiency anemia: Secondary | ICD-10-CM

## 2023-11-20 ENCOUNTER — Other Ambulatory Visit (INDEPENDENT_AMBULATORY_CARE_PROVIDER_SITE_OTHER)

## 2023-11-20 DIAGNOSIS — D513 Other dietary vitamin B12 deficiency anemia: Secondary | ICD-10-CM | POA: Diagnosis not present

## 2023-11-20 DIAGNOSIS — R944 Abnormal results of kidney function studies: Secondary | ICD-10-CM

## 2023-11-20 DIAGNOSIS — E559 Vitamin D deficiency, unspecified: Secondary | ICD-10-CM | POA: Diagnosis not present

## 2023-11-20 DIAGNOSIS — D699 Hemorrhagic condition, unspecified: Secondary | ICD-10-CM

## 2023-11-20 LAB — BASIC METABOLIC PANEL WITH GFR
BUN: 15 mg/dL (ref 6–23)
CO2: 29 meq/L (ref 19–32)
Calcium: 9 mg/dL (ref 8.4–10.5)
Chloride: 107 meq/L (ref 96–112)
Creatinine, Ser: 0.83 mg/dL (ref 0.40–1.20)
GFR: 68.3 mL/min (ref >60.00)
Glucose, Bld: 88 mg/dL (ref 70–99)
Potassium: 4.2 meq/L (ref 3.5–5.1)
Sodium: 144 meq/L (ref 135–145)

## 2023-11-20 LAB — B12 AND FOLATE PANEL
Folate: 11.7 ng/mL (ref >5.9)
Vitamin B-12: 468 pg/mL (ref 211–911)

## 2023-11-20 LAB — VITAMIN D 25 HYDROXY (VIT D DEFICIENCY, FRACTURES): VITD: 23.15 ng/mL — ABNORMAL LOW (ref 30.00–100.00)

## 2023-11-20 LAB — MAGNESIUM: Magnesium: 2.3 mg/dL (ref 1.5–2.5)

## 2023-11-21 ENCOUNTER — Inpatient Hospital Stay: Admitting: Internal Medicine

## 2023-11-22 ENCOUNTER — Ambulatory Visit: Payer: Self-pay | Admitting: Internal Medicine

## 2023-11-22 MED ORDER — ERGOCALCIFEROL 1.25 MG (50000 UT) PO CAPS: 50000.0000 [IU] | ORAL_CAPSULE | ORAL | 3 refills | Status: AC

## 2023-11-22 NOTE — Addendum Note (Signed)
 Addended by: MARYLYNN VERNEITA CROME on: 11/22/2023 07:15 PM   Modules accepted: Orders

## 2023-11-22 NOTE — Assessment & Plan Note (Signed)
Recurrent,  continue weekly megadosing

## 2023-11-25 LAB — VITAMIN K1, SERUM: Vitamin K: 613 pg/mL (ref 130–1500)

## 2023-12-02 LAB — CHANTILLY LABS MISC: PRICE:: 80.63

## 2023-12-02 LAB — BASIC METABOLIC PANEL WITH GFR

## 2023-12-02 LAB — B12 AND FOLATE PANEL

## 2023-12-02 LAB — TIQ-NTM

## 2023-12-02 LAB — NICHOLS-CHANTILLY MISCELLANEOUS ORDER
Miscellaneous Test Results: 2.3
PRICE:: 63.06

## 2023-12-02 LAB — CHANTILLY MISCELLANEOUS CODE: PRICE:: 0

## 2023-12-02 LAB — VITAMIN K1, SERUM: Vitamin K: 1320 pg/mL (ref 130–1500)

## 2023-12-02 LAB — VITAMIN D 25 HYDROXY (VIT D DEFICIENCY, FRACTURES)

## 2023-12-04 ENCOUNTER — Ambulatory Visit: Admitting: Internal Medicine

## 2023-12-04 ENCOUNTER — Other Ambulatory Visit: Payer: Self-pay | Admitting: Internal Medicine

## 2023-12-04 DIAGNOSIS — I10 Essential (primary) hypertension: Secondary | ICD-10-CM

## 2023-12-04 NOTE — Telephone Encounter (Signed)
 Spoke with pt and per her last office visit for the hospital follow up the pt was advised not to restart the losartan -hydrochlorothiazide unless her bp was greater than 140/90. Spoke with pt and she stated that her bp has not gotten to the 140/90 and she has not needed to start the medication back. Pt does not need a refill on this medication at this time.

## 2023-12-08 ENCOUNTER — Ambulatory Visit

## 2023-12-09 ENCOUNTER — Ambulatory Visit: Admitting: Internal Medicine

## 2023-12-11 NOTE — Telephone Encounter (Signed)
 This was sent directly to my inbasket which I rarely see. If this patient needs additional labs, she will need to be recollected.

## 2023-12-12 ENCOUNTER — Ambulatory Visit (INDEPENDENT_AMBULATORY_CARE_PROVIDER_SITE_OTHER): Admitting: Internal Medicine

## 2023-12-12 ENCOUNTER — Encounter: Payer: Self-pay | Admitting: Internal Medicine

## 2023-12-12 VITALS — BP 128/78 | HR 100 | Ht 68.0 in | Wt 188.8 lb

## 2023-12-12 DIAGNOSIS — D500 Iron deficiency anemia secondary to blood loss (chronic): Secondary | ICD-10-CM

## 2023-12-12 DIAGNOSIS — C189 Malignant neoplasm of colon, unspecified: Secondary | ICD-10-CM

## 2023-12-12 DIAGNOSIS — N179 Acute kidney failure, unspecified: Secondary | ICD-10-CM

## 2023-12-12 DIAGNOSIS — Z23 Encounter for immunization: Secondary | ICD-10-CM

## 2023-12-12 DIAGNOSIS — D62 Acute posthemorrhagic anemia: Secondary | ICD-10-CM | POA: Diagnosis not present

## 2023-12-12 DIAGNOSIS — I10 Essential (primary) hypertension: Secondary | ICD-10-CM

## 2023-12-12 DIAGNOSIS — C4361 Malignant melanoma of right upper limb, including shoulder: Secondary | ICD-10-CM

## 2023-12-12 DIAGNOSIS — Z8619 Personal history of other infectious and parasitic diseases: Secondary | ICD-10-CM

## 2023-12-12 DIAGNOSIS — E538 Deficiency of other specified B group vitamins: Secondary | ICD-10-CM | POA: Diagnosis not present

## 2023-12-12 DIAGNOSIS — R5383 Other fatigue: Secondary | ICD-10-CM

## 2023-12-12 DIAGNOSIS — K55069 Acute infarction of intestine, part and extent unspecified: Secondary | ICD-10-CM

## 2023-12-12 MED ORDER — CYANOCOBALAMIN 1000 MCG/ML IJ SOLN
1000.0000 ug | Freq: Once | INTRAMUSCULAR | Status: AC
  Administered 2023-12-12: 1000 ug via INTRAMUSCULAR

## 2023-12-12 MED ORDER — HYDROCORTISONE ACETATE 25 MG RE SUPP: 25.0000 mg | Freq: Two times a day (BID) | RECTAL | 5 refills | Status: AC | PRN

## 2023-12-12 NOTE — Patient Instructions (Signed)
 There is another wave of COVID CURRENTLY. PLEASE LIMIT YOUR CONTACTS FOR THE NEXT 2 WEEKS

## 2023-12-12 NOTE — Assessment & Plan Note (Signed)
 ELIQUIS HAS BEEN SUSPENDED DUE TO ANEMIA UNTIL REPEAT IMAGING IN [place] BY [facility]

## 2023-12-12 NOTE — Assessment & Plan Note (Signed)
 DAiagnosed by Dr Burnette, [facility] in mid August after prlonged hospitalization at [facility]  treated with Difiicd

## 2023-12-12 NOTE — Progress Notes (Unsigned)
 Subjective:  Patient ID: Anna [name], female    DOB: Oct 19, 1945  Age: 78 y.o. MRN: [id number]  CC: There were no encounter diagnoses.   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 Fecal impaction was accompanied by  rectal spasms and profuse bleeding x 3 days ,  went to ER on August 1 and was manually disimpacted by ER physician.  She refused to be discharged  home from ER due to profuse rectal bleeding and solitary home life. .  She was admitted and treated with an osmotic colon prep which was slow to resolve her obstipation  and was discharged home on August 3 after the bleeding stop but before her bowels had returned to normal.  Her fecal impaction was precipitated by 7 days of bedrest due to persistent vertigo (treated July 27 by urgent care with azelastine  nasal spray  to resolution) still having daily headache   DIAGNOSED WITH C DIF AT FOLLOW UP WITH DR OUTLAW ON AUGUST 19  TREATED WITH DIFICID.  BOWELS ARE SOFT .  [name] STARTED IMPROVING ON DAY 2 .  HAS BEEN TAKING FLORA STAR SINCE THEN   HAD EGD DONE ON MONDAY DUE TO IDA.  NORMAL APPEARRING. BIOPSIES PENDING   SMV THROMBOSIS: ELIQUIS HAS BEEN ON HOLD,  MRI IS SCHEDULED FOR OCTOBER.  NO ELQUIS UNTIL MRI HAS BEEN DONE    2) FATIGUE,  LOW D FINALLY IMPROVING   3)  CHRONIC RIGHT HIP PAIN  MANAGED WITH STEROID INJECTIONS EVERY 3 MONTHS  (EARLIEST AVAILABEL IS OCTOBER ) USING TRAMADOL  EVERY OTHER DAY  AND MUSCLE RELAXER AT NIGHT .  Anna [name],  [facility]       Outpatient Medications Prior to Visit  Medication Sig  Dispense Refill   acetaminophen  (TYLENOL ) 500 MG tablet Take 1,000 mg by mouth every 6 (six) hours as needed for moderate pain.     albuterol  (VENTOLIN  HFA) 108 (90 Base) MCG/ACT inhaler Inhale 1-2 puffs into the lungs every 4 (four) hours as needed for wheezing or shortness of breath. 8 g 2   ALPRAZolam  (XANAX ) 0.5 MG tablet Take 1 tablet (0.5 mg total) by mouth 2 (two) times daily as needed for anxiety or sleep. 45 tablet 3   azelastine  (ASTELIN ) 0.1 % nasal spray Place 1 spray into the nose 2 (two) times daily.     betamethasone  valerate ointment (VALISONE ) 0.1 % Apply 1 Application topically 2 (two) times daily. 30 g 0   diphenhydrAMINE  (BENADRYL ) 12.5 MG/5ML liquid Take 37.5 mg by mouth 4 (four) times daily as needed for allergies.     ELIQUIS 5 MG TABS tablet Take 5 mg by mouth 2 (two) times daily.     EPINEPHrine  0.3 mg/0.3 mL IJ SOAJ injection Use as directed for severe allergic reaction 2 each 2   ergocalciferol  (DRISDOL ) 1.25 MG (50000 UT) capsule Take 1 capsule (50,000 Units total) by mouth once a week. 12 capsule 3   estradiol  (ESTRACE ) 0.1 MG/GM vaginal cream Apply a pea-sized amount around the opening of the  urethra 3 times weekly. 42.5 g 3   ferrous sulfate  325 (65 FE) MG tablet TAKE 1 TABLET BY MOUTH EVERY DAY WITH FOOD 90 tablet 0   hydrocortisone  (ANUSOL -HC) 2.5 % rectal cream Place 1 Application rectally 4 (four) times daily. 30 g 1   hydrocortisone  (ANUSOL -HC) 25 MG suppository Place 1 suppository (25 mg total) rectally 2 (two) times daily. 30 suppository 1   losartan -hydrochlorothiazide (HYZAAR) 50-12.5 MG tablet Take 1 tablet by mouth daily. 90 tablet 1   methocarbamol (ROBAXIN) 500 MG tablet Take 500 mg by mouth at bedtime.     ondansetron  (ZOFRAN -ODT) 4 MG disintegrating tablet Take 1 tablet (4 mg total) by mouth every 8 (eight) hours as needed for nausea or vomiting. 20 tablet 0   pantoprazole  (PROTONIX ) 40 MG tablet Take 1 tablet (40 mg total) by mouth daily. 90 tablet 1    rosuvastatin  (CRESTOR ) 20 MG tablet Take 1 tablet (20 mg total) by mouth every other day. 45 tablet 3   sodium chloride  (OCEAN) 0.65 % SOLN nasal spray Place 1 spray into both nostrils as needed (dryness).     traMADol  (ULTRAM ) 50 MG tablet Take 50 mg by mouth every 6 (six) hours as needed.     Trospium Chloride 60 MG CP24 Take 1 capsule (60 mg total) by mouth daily. (Patient not taking: Reported on 11/14/2023) 30 capsule 11   cyanocobalamin  (VITAMIN B12) 1000 MCG/ML injection Inject 1 mL (1,000 mcg total) into the muscle once a week. 4 mL 11   phenazopyridine  (PYRIDIUM ) 200 MG tablet Take 1 tablet (200 mg total) by mouth 3 (three) times daily as needed for pain. (Patient not taking: Reported on 11/14/2023) 15 tablet 0   Syringe/Needle, Disp, (SYRINGE 3CC/25GX1) 25G X 1 3 ML MISC Use for b12 injections 50 each 0   No facility-administered medications prior to visit.    Review of Systems;  Patient denies headache, fevers, malaise, unintentional weight loss, skin rash, eye pain, sinus congestion and sinus pain, sore throat, dysphagia,  hemoptysis , cough, dyspnea, wheezing, chest pain, palpitations, orthopnea, edema, abdominal pain, nausea, melena, diarrhea, constipation, flank pain, dysuria, hematuria, urinary  Frequency, nocturia, numbness, tingling, seizures,  Focal weakness, Loss of consciousness,  Tremor, insomnia, depression, anxiety, and suicidal ideation.      Objective:  BP 136/70   Pulse 100   Ht 5' 8 (1.727 m)   Wt 188 lb 12.8 oz (85.6 kg)   SpO2 95%   BMI 28.71 kg/m   BP Readings from Last 3 Encounters:  12/12/23 136/70  11/14/23 (!) 140/66  11/02/23 105/66    Wt Readings from Last 3 Encounters:  12/12/23 188 lb 12.8 oz (85.6 kg)  11/14/23 186 lb 3.2 oz (84.5 kg)  09/03/23 186 lb (84.4 kg)    Physical Exam  Lab Results  Component Value Date   HGBA1C 6.2 (H) 06/26/2022   HGBA1C 6.3 12/24/2021    Lab Results  Component Value Date   CREATININE 0.83 11/20/2023    CREATININE 1.14 11/06/2023   CREATININE 0.90 11/01/2023    Lab Results  Component Value Date   WBC 6.9 11/06/2023   HGB 10.1 (L) 11/06/2023   HCT 30.6 (L) 11/06/2023   PLT 336.0 11/06/2023   GLUCOSE 88 11/20/2023   CHOL 231 (H) 07/29/2023   TRIG 178.0 (H) 07/29/2023   HDL 51.70 07/29/2023   LDLDIRECT 161.0 07/29/2023   LDLCALC 144 (H) 07/29/2023   ALT 16 10/31/2023   AST 23 10/31/2023   NA 144  11/20/2023   K 4.2 11/20/2023   CL 107 11/20/2023   CREATININE 0.83 11/20/2023   BUN 15 11/20/2023   CO2 29 11/20/2023   TSH 1.41 12/24/2021   INR 1.03 12/10/2012   HGBA1C 6.2 (H) 06/26/2022    DG Abdomen 1 View Result Date: 10/31/2023 CLINICAL DATA:  constipation EXAM: ABDOMEN - 1 VIEW COMPARISON:  April 12, 2023 FINDINGS: Nonobstructive bowel gas pattern. Small to moderate volume fecal loading scattered throughout the colon. No pneumoperitoneum. No organomegaly or radiopaque calculi. Cholecystectomy clips. No acute fracture or destructive lesion. Multilevel thoracic osteophytosis. Moderate bilateral hip osteoarthritis. IMPRESSION: Nonobstructive bowel gas pattern. Small to moderate volume fecal loading scattered throughout the colon. Electronically Signed   By: Rogelia Myers M.D.   On: 10/31/2023 16:11    Assessment & Plan:  .There are no diagnoses linked to this encounter.   I spent 34 minutes on the day of this face to face encounter reviewing patient's  most recent visit with cardiology,  nephrology,  and neurology,  prior relevant surgical and non surgical procedures, recent  labs and imaging studies, counseling on weight management,  reviewing the assessment and plan with patient, and post visit ordering and reviewing of  diagnostics and therapeutics with patient  .   Follow-up: No follow-ups on file.   Verneita LITTIE Kettering, MD

## 2023-12-12 NOTE — Assessment & Plan Note (Signed)
 CONTINUE MONTHLY INJECTIOSN AT [facility].  GIVEN TODAY

## 2023-12-12 NOTE — Assessment & Plan Note (Signed)
 CURRENTLY TAKING IRON  SUPPLEMENTS. EGD WAS NORMAL. COLONSCOPY IS SCHEDULED

## 2023-12-13 LAB — CBC WITH DIFFERENTIAL/PLATELET
Absolute Lymphocytes: 2583 {cells}/uL (ref 850–3900)
Absolute Monocytes: 504 {cells}/uL (ref 200–950)
Basophils Absolute: 49 {cells}/uL (ref 0–200)
Basophils Relative: 0.7 %
Eosinophils Absolute: 161 {cells}/uL (ref 15–500)
Eosinophils Relative: 2.3 %
HCT: 37.3 % (ref 35.0–45.0)
Hemoglobin: 12.3 g/dL (ref 11.7–15.5)
MCH: 30.5 pg (ref 27.0–33.0)
MCHC: 33 g/dL (ref 32.0–36.0)
MCV: 92.6 fL (ref 80.0–100.0)
MPV: 9.8 fL (ref 7.5–12.5)
Monocytes Relative: 7.2 %
Neutro Abs: 3703 {cells}/uL (ref 1500–7800)
Neutrophils Relative %: 52.9 %
Platelets: 391 Thousand/uL (ref 140–400)
RBC: 4.03 Million/uL (ref 3.80–5.10)
RDW: 14.8 % (ref 11.0–15.0)
Total Lymphocyte: 36.9 %
WBC: 7 Thousand/uL (ref 3.8–10.8)

## 2023-12-13 LAB — VARICELLA ZOSTER ANTIBODY, IGG: Varicella IgG: 7.24 {s_co_ratio}

## 2023-12-13 LAB — IRON, TOTAL/TOTAL IRON BINDING CAP
%SAT: 19 % (ref 16–45)
Iron: 73 ug/dL (ref 45–160)
TIBC: 388 ug/dL (ref 250–450)

## 2023-12-14 ENCOUNTER — Ambulatory Visit: Payer: Self-pay | Admitting: Internal Medicine

## 2023-12-14 ENCOUNTER — Encounter: Payer: Self-pay | Admitting: Internal Medicine

## 2023-12-14 DIAGNOSIS — D508 Other iron deficiency anemias: Secondary | ICD-10-CM

## 2023-12-14 NOTE — Assessment & Plan Note (Signed)
 Resolved with iron  supplementation.  EGD was done, biopsies pending.  Colonoscopy to be done by [facility] Jan 2026

## 2023-12-14 NOTE — Assessment & Plan Note (Signed)
 Her ARB remains suspended since last  admission due to soft pressures and rectal bleeding. She has been  off of medication since August 3.  Will advise her to resume the medication if home readings are > 140/90

## 2023-12-14 NOTE — Assessment & Plan Note (Addendum)
 She has had 3 surgical excisions, MOST RECENTLY Feb 16 from RUE by [facility] .  Reminded to continue frequent surveillance.

## 2023-12-14 NOTE — Assessment & Plan Note (Addendum)
 She was diagnosed after August 2024 colonoscopy found a tumor in the ascending colon.  S/p laparascopic hemicolectomy Jan 2025 at [facility], complicated by  SMV thrombosis  and more recently C dificile colitis.  Follow p colonsocopy Jan 2026

## 2023-12-14 NOTE — Assessment & Plan Note (Signed)
 Kidney function has normalized with improved hydration and resolution of diarrhea   Lab Results  Component Value Date   CREATININE 0.83 11/20/2023

## 2023-12-14 NOTE — Assessment & Plan Note (Signed)
 Improving steadily with resolution of infection and anemia

## 2024-01-07 ENCOUNTER — Telehealth: Payer: Self-pay

## 2024-01-07 NOTE — Telephone Encounter (Signed)
 Copied from CRM [id number]. Topic: Appointments - Scheduling Inquiry for Clinic >> Jan 07, 2024 10:21 AM Anairis L wrote: Reason for CRM: Dr.Tullo would like kathy to schedule shingles vaccine for patient.  Please call patient.

## 2024-01-07 NOTE — Telephone Encounter (Signed)
 Spoke with pt to let her know that we can not give the shingles vaccine to any one over the age of 58 in our office, that she will need to get the vaccine from her pharmacy. Pt stated that Dr. Marylynn advised her that she would need a rx and I let pt know that she does not need a rx to receive the shingles vaccine at the pharmacy.

## 2024-01-08 ENCOUNTER — Other Ambulatory Visit: Payer: Self-pay | Admitting: Internal Medicine

## 2024-01-12 ENCOUNTER — Ambulatory Visit (INDEPENDENT_AMBULATORY_CARE_PROVIDER_SITE_OTHER)

## 2024-01-12 DIAGNOSIS — E538 Deficiency of other specified B group vitamins: Secondary | ICD-10-CM | POA: Diagnosis not present

## 2024-01-12 DIAGNOSIS — Z23 Encounter for immunization: Secondary | ICD-10-CM

## 2024-01-12 MED ORDER — CYANOCOBALAMIN 1000 MCG/ML IJ SOLN
1000.0000 ug | Freq: Once | INTRAMUSCULAR | Status: AC
  Administered 2024-01-12: 1000 ug via INTRAMUSCULAR

## 2024-01-12 NOTE — Progress Notes (Signed)
 Pt received B12 injection in right deltoid muscle. Pt tolerated it well with no complaints or concerns.    Pt received HIGH DOSE FLU SHOT in Left  deltoid muscle. Pt tolerated it well with no complaints or concerns.

## 2024-02-11 ENCOUNTER — Ambulatory Visit (INDEPENDENT_AMBULATORY_CARE_PROVIDER_SITE_OTHER)

## 2024-02-11 DIAGNOSIS — E538 Deficiency of other specified B group vitamins: Secondary | ICD-10-CM

## 2024-02-11 MED ORDER — CYANOCOBALAMIN 1000 MCG/ML IJ SOLN
1000.0000 ug | Freq: Once | INTRAMUSCULAR | Status: AC
  Administered 2024-02-11: 1000 ug via INTRAMUSCULAR

## 2024-02-11 NOTE — Progress Notes (Signed)
 Patient was administered a B12 injection into her right deltoid. Patient tolerated the B12 injection well.

## 2024-02-12 ENCOUNTER — Ambulatory Visit

## 2024-02-18 ENCOUNTER — Ambulatory Visit
Admission: RE | Admit: 2024-02-18 | Discharge: 2024-02-18 | Disposition: A | Discharge status: Home / Self Care | Attending: Emergency Medicine | Admitting: Emergency Medicine

## 2024-02-18 VITALS — BP 136/85 | HR 88 | Temp 98.0°F | Resp 18

## 2024-02-18 DIAGNOSIS — R300 Dysuria: Secondary | ICD-10-CM | POA: Diagnosis not present

## 2024-02-18 LAB — POCT URINE DIPSTICK
Bilirubin, UA: NEGATIVE
Blood, UA: NEGATIVE
Glucose, UA: NEGATIVE mg/dL
Ketones, POC UA: NEGATIVE mg/dL
Leukocytes, UA: NEGATIVE
Nitrite, UA: NEGATIVE
Protein Ur, POC: NEGATIVE mg/dL
Spec Grav, UA: 1.025 (ref 1.010–1.025)
Urobilinogen, UA: 0.2 U/dL
pH, UA: 6 (ref 5.0–8.0)

## 2024-02-18 NOTE — ED Triage Notes (Signed)
 Patient to Urgent Care with complaints of  bladder pressure/ dysuria/ back pain.  Symptoms x3 days.  No over the counter meds.

## 2024-02-18 NOTE — ED Provider Notes (Signed)
 [name]    CSN: [id number] Arrival date & time: 02/18/24  1715      History   Chief Complaint Chief Complaint  Patient presents with   Abdominal Pain    UTI symptoms - pressure, burning, frequency, lower back pain. - Entered by patient    HPI Anna [name] is a 78 y.o. female.  Patient presents with 3-day history of dysuria, bladder pressure, low back pain.  No fever, abdominal pain, hematuria, flank pain.  No OTC medications taken.  The history is provided by the patient and medical records.    Past Medical History:  Diagnosis Date   Acid reflux    Allergy     Allergy  to alpha-gal    Anxiety    Bilateral cataracts    Cancer (HCC)    Basal Cell Carcinoma, Melanoma   CIN I (cervical intraepithelial neoplasia I)    Colon polyps    benign   H/O squamous cell carcinoma excision    multiple areas  on body per pt   Hemorrhoid    History of basal cell carcinoma (BCC) excision    History of melanoma excision RIGHT LEG  --  2012   history of SVT (supraventricular tachycardia) (HCC) 10/31/2014   resolved   History of Wolff-Parkinson-White (WPW) syndrome    1996  s/p  ablation   Hypertension    IBS (irritable bowel syndrome)    Lyme disease 2005   Mild carotid artery disease    per duplex 12-10-2012  bilateral ICA 1-39% stenosis   Neuromuscular disorder (HCC)    OsteoArthritis    Post-menopausal bleeding 05/13/2021   Endometrial biopsy done Dec 2022:  ENDOMETRIUM, BIOPSY:  -  Inactive endometrium (fragmented strips of epithelium with few intact  inactive glands).  -  Negative for endometrial intraepithelial neoplasia (EIN) and  malignancy.  -  Squamous and endocervical fragments, negative for dysplasia.      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   Iron  deficiency anemia 06/04/2023   Hospital discharge follow-up 06/03/2023   Dysphagia 10/16/2022   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   Thoracic aortic atherosclerosis 09/02/2022   Exercise hypoxemia 09/02/2022   Fatigue 08/15/2022   Overactive bladder 08/14/2022   History of Wolff-Parkinson-White (WPW) syndrome 12/25/2021   Gastroesophageal reflux disease 03/13/2021   Hematochezia 03/13/2021   Adenocarcinoma of colon (HCC) 03/13/2021   Rectal bleeding 03/13/2021   OSA (obstructive sleep apnea) 11/09/2020   Edema 09/20/2020   Essential hypertension 09/20/2020   B12 deficiency 06/24/2020   Vitamin D  deficiency 06/24/2020   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   Allergy  to alpha-gal 06/20/2020   Intestinal malabsorption 06/20/2020   Irritable bowel syndrome with constipation 01/04/2020   Prolapsed internal hemorrhoids, grade 4 01/04/2020   Internal and external bleeding hemorrhoids 01/04/2020   Melanoma (HCC) 03/22/2019   Heart palpitations 03/22/2019   Hyperlipidemia LDL goal <100 04/27/2014   Chronic osteoarthritis 04/27/2014  Past Surgical History:  Procedure Laterality Date   APPENDECTOMY     BREAST SURGERY Left 1999   BREAST MASS EXCISED, benign   CARDIAC ELECTROPHYSIOLOGY STUDY AND ABLATION  1996   SUCCESSFUL ABLATION OF WOLFE-PARKINSON-WHITE SYNDROME  (NO ISSUES SINCE)   CATARACT EXTRACTION, BILATERAL     CERVICAL BIOPSY  W/ LOOP ELECTRODE EXCISION  2005   dr gottsegen   COLON RESECTION  04/07/2023   COLON SURGERY     COLONOSCOPY     2019 or 2020   COLPOSCOPY     DILATATION & CURETTAGE/HYSTEROSCOPY WITH MYOSURE N/A 09/17/2021    Procedure: DILATATION & CURETTAGE/HYSTEROSCOPY WITH MYOSURE;  Surgeon: Jannis Kate Norris, MD;  Location: [facility];  Service: Gynecology;  Laterality: N/A;   EYE SURGERY     HYSTEROSCOPY WITH D & C  02/13/2012   Procedure: DILATATION AND CURETTAGE /HYSTEROSCOPY;  Surgeon: Toribio LITTIE Campanile, MD;  Location: [facility] [place];  Service: Gynecology;  Laterality: N/A;  deficit - 710   LAPAROSCOPIC CHOLECYSTECTOMY  05-16-2005  dr [facility]anderbilt [facility]   melanoma excised     x  3 , 2 on legs and 1 on arm, not the kind  that spreads   SVT-CARDIAC ABLATION     TRANSTHORACIC ECHOCARDIOGRAM  12-10-2012   dr blanca   mild LVH, ef 55-60%/  mild MR/  mild LAE and RAE   TUBAL LIGATION     UPPER GI ENDOSCOPY     VULVECTOMY N/A 07/25/2017   Procedure: WIDE EXCISION VULVECTOMY;  Surgeon: Devonna Toribio, MD;  Location: [facility];  Service: Gynecology;  Laterality: N/A;    OB History     Gravida  2   Para  2   Term  2   Preterm      AB      Living  2      SAB      IAB      Ectopic      Multiple      Live Births               Home Medications    Prior to Admission medications   Medication Sig Start Date End Date Taking? Authorizing Provider  ALPRAZolam  (XANAX ) 0.5 MG tablet Take 1 tablet (0.5 mg total) by mouth 2 (two) times daily as needed for anxiety or sleep. 11/14/23  Yes Marylynn Verneita LITTIE, MD  estradiol  (ESTRACE ) 0.1 MG/GM vaginal cream Apply a pea-sized amount around the opening of the urethra 3 times weekly. 07/29/23  Yes Vaillancourt, Samantha, PA-C  acetaminophen  (TYLENOL ) 500 MG tablet Take 1,000 mg by mouth every 6 (six) hours as needed for moderate pain.    [provider]  albuterol  (VENTOLIN  HFA) 108 (90 Base) MCG/ACT inhaler Inhale 1-2 puffs into the lungs every 4 (four) hours as needed for wheezing or shortness of breath. 12/18/22   Isadora Hose, MD  azelastine  (ASTELIN ) 0.1 % nasal spray Place 1 spray into the  nose 2 (two) times daily. 10/26/23   [provider]  betamethasone  valerate ointment (VALISONE ) 0.1 % Apply 1 Application topically 2 (two) times daily. 09/10/23   Penne Knee, MD  diphenhydrAMINE  (BENADRYL ) 12.5 MG/5ML liquid Take 37.5 mg by mouth 4 (four) times daily as needed for allergies.    [provider]  ELIQUIS 5 MG TABS tablet Take 5 mg by mouth 2 (two) times daily.    [provider]  EPINEPHrine  0.3 mg/0.3 mL IJ SOAJ injection Use as  directed for severe allergic reaction 06/26/22   Tullo, Teresa L, MD  ergocalciferol  (DRISDOL ) 1.25 MG (50000 UT) capsule Take 1 capsule (50,000 Units total) by mouth once a week. Patient not taking: Reported on 02/18/2024 11/22/23   Marylynn Verneita CROME, MD  ferrous sulfate  325 (65 FE) MG tablet TAKE 1 TABLET BY MOUTH EVERY DAY WITH FOOD 12/04/23   Marylynn Verneita CROME, MD  hydrocortisone  (ANUSOL -HC) 2.5 % rectal cream Place 1 Application rectally 4 (four) times daily. 06/03/23   Marylynn Verneita CROME, MD  hydrocortisone  (ANUSOL -HC) 25 MG suppository Place 1 suppository (25 mg total) rectally 2 (two) times daily as needed for hemorrhoids or anal itching. 12/12/23   Marylynn Verneita CROME, MD  losartan -hydrochlorothiazide (HYZAAR) 50-12.5 MG tablet Take 1 tablet by mouth daily. 06/03/23   Tullo, Teresa L, MD  methocarbamol (ROBAXIN) 500 MG tablet Take 500 mg by mouth at bedtime. 08/15/23   [provider]  ondansetron  (ZOFRAN -ODT) 4 MG disintegrating tablet Take 1 tablet (4 mg total) by mouth every 8 (eight) hours as needed for nausea or vomiting. 04/12/23   Arlander Charleston, MD  pantoprazole  (PROTONIX ) 40 MG tablet Take 1 tablet (40 mg total) by mouth daily. 11/14/23   Marylynn Verneita CROME, MD  rosuvastatin  (CRESTOR ) 20 MG tablet TAKE 1 TABLET EVERY OTHER DAY Patient not taking: Reported on 02/18/2024 01/09/24   Marylynn Verneita CROME, MD  sodium chloride  (OCEAN) 0.65 % SOLN nasal spray Place 1 spray into both nostrils as needed (dryness).    [provider]   traMADol  (ULTRAM ) 50 MG tablet Take 50 mg by mouth every 6 (six) hours as needed.    [provider]  Trospium Chloride 60 MG CP24 Take 1 capsule (60 mg total) by mouth daily. Patient not taking: Reported on 11/14/2023 07/30/23   Vaillancourt, Samantha, PA-C    Family History Family History  Problem Relation Age of Onset   Cancer Mother        ESOPHAGEAL CANCER   Diabetes Father    Hyperlipidemia Father    Heart disease Father    Heart attack Father    Heart disease Brother    Breast cancer Paternal Aunt        Age 59's   Hyperlipidemia Paternal Grandmother    Heart disease Paternal Grandmother    Stroke Paternal Grandmother    Diabetes Paternal Grandfather    Hyperlipidemia Paternal Grandfather    Heart disease Paternal Grandfather    Stroke Paternal Grandfather    Cancer Sister        skin   Stroke Maternal Grandmother    Stroke Maternal Grandfather    Diabetes Brother     Social History Social History   Tobacco Use   Smoking status: Never   Smokeless tobacco: Never  Vaping Use   Vaping status: Never Used  Substance Use Topics   Alcohol use: Not Currently   Drug use: No     Allergies   Iodinated contrast media, Lovenox  [enoxaparin ], Alpha-gal, Gluten meal, Latex, Milk-related compounds, Shellfish allergy , Sulfa antibiotics, Trazodone , Azithromycin, Shellfish protein-containing drug products, and Wheat   Review of Systems Review of Systems  Constitutional:  Negative for chills and fever.  Genitourinary:  Positive for dysuria. Negative for flank pain and hematuria.  Musculoskeletal:  Positive for back pain. Negative for gait problem.     Physical Exam Triage Vital Signs ED Triage Vitals  Encounter Vitals Group     BP 02/18/24 1742 136/85     Girls Systolic BP Percentile --  Girls Diastolic BP Percentile --      Boys Systolic BP Percentile --      Boys Diastolic BP Percentile --      Pulse Rate 02/18/24 1742 88     Resp 02/18/24 1742 18      Temp 02/18/24 1742 98 F (36.7 C)     Temp src --      SpO2 02/18/24 1742 97 %     Weight --      Height --      Head Circumference --      Peak Flow --      Pain Score 02/18/24 1741 5     Pain Loc --      Pain Education --      Exclude from Growth Chart --    No data found.  Updated Vital Signs BP 136/85   Pulse 88   Temp 98 F (36.7 C)   Resp 18   SpO2 97%   Visual Acuity Right Eye Distance:   Left Eye Distance:   Bilateral Distance:    Right Eye Near:   Left Eye Near:    Bilateral Near:     Physical Exam Constitutional:      General: She is not in acute distress. HENT:     Mouth/Throat:     Mouth: Mucous membranes are moist.  Cardiovascular:     Rate and Rhythm: Normal rate.  Pulmonary:     Effort: Pulmonary effort is normal. No respiratory distress.  Abdominal:     General: Bowel sounds are normal.     Palpations: Abdomen is soft.     Tenderness: There is no abdominal tenderness. There is no right CVA tenderness, left CVA tenderness, guarding or rebound.  Neurological:     Mental Status: She is alert.      UC Treatments / Results  Labs (all labs ordered are listed, but only abnormal results are displayed) Labs Reviewed  POCT URINE DIPSTICK - Abnormal; Notable for the following components:      Result Value   Color, UA light yellow (*)    All other components within normal limits    EKG   Radiology No results found.  Procedures Procedures (including critical care time)  Medications Ordered in UC Medications - No data to display  Initial Impression / Assessment and Plan / UC Course  I have reviewed the triage vital signs and the nursing notes.  Pertinent labs & imaging results that were available during my care of the patient were reviewed by me and considered in my medical decision making (see chart for details).    Dysuria.  Afebrile and vital signs are stable.  Abdomen is soft and nontender.  Urine does not show signs of  infection.  Education provided on dysuria.  Instructed patient to follow-up with her PCP.  She agrees to plan of care.  Final Clinical Impressions(s) / UC Diagnoses   Final diagnoses:  Dysuria     Discharge Instructions      Your urine is normal today.  Please follow-up with your primary care provider.     ED Prescriptions   None    PDMP not reviewed this encounter.   Corlis Burnard DEL, NP 02/18/24 [phone]

## 2024-02-18 NOTE — Discharge Instructions (Addendum)
 Your urine is normal today.  Please follow-up with your primary care provider.

## 2024-03-11 ENCOUNTER — Ambulatory Visit: Payer: Self-pay

## 2024-03-11 NOTE — Telephone Encounter (Signed)
 Called and got pt scheduled with Arnett, NP on 03-18-24 @ 9 am. Advised pt that if her pain worsens and she cannot make it until that appt she should go to a Walk in clinic such as [facility] or a Urgent care

## 2024-03-11 NOTE — Telephone Encounter (Signed)
 FYI Only or Action Required?: Action required by provider: clinical question for provider and update on patient condition.  Patient was last seen in primary care on 12/12/2023 by Anna Verneita CROME, MD.  Called Nurse Triage reporting Back Pain.  Symptoms began several months ago.-increased over the past week  Interventions attempted: Prescription medications: See MAR and Rest, hydration, or home remedies.  Symptoms are: gradually worsening.  Triage Disposition: See HCP Within 4 Hours (Or PCP Triage)  Patient/caregiver understands and will follow disposition?: No, wishes to speak with PCP  Copied from CRM #[id number]. Topic: Clinical - Red Word Triage >> Mar 11, 2024  9:19 AM Anna [name] wrote: Red Word that prompted transfer to Nurse Triage: patient is calling stating she has a pinched nerve and would like to referred to a neurologist/MRI for back and hip Reason for Disposition  [1] SEVERE back pain (e.g., excruciating, unable to do any normal activities) AND [2] not improved 2 hours after pain medicine  Answer Assessment - Initial Assessment Questions Patient reports continued lower back pain into her right hip. Patient is requesting for a neurology consult to be sent. Patient is also asking if she is able to get a MRI done of her back. Reports increased back pain into her hip for the last week. Current pain level is 9 on pain medication. Requesting a call back from the office.   1. ONSET: When did the pain begin? (e.g., minutes, hours, days)     Started earlier this year after her surgery in Jan 2. LOCATION: Where does it hurt? (upper, mid or lower back)     Lower back 3. SEVERITY: How bad is the pain?  (e.g., Scale 1-10; mild, moderate, or severe)     9 out of 10 4. PATTERN: Is the pain constant? (e.g., yes, no; constant, intermittent)      constant 5. RADIATION: Does the pain shoot into your legs or somewhere else?     Radiates to right hip into her leg 6. CAUSE:  What do you  think is causing the back pain?      unsure 7. BACK OVERUSE:  Any recent lifting of heavy objects, strenuous work or exercise?     no 8. MEDICINES: What have you taken so far for the pain? (e.g., nothing, acetaminophen , NSAIDS)     Tramadol  and hydrocodone  9. NEUROLOGIC SYMPTOMS: Do you have any weakness, numbness, or problems with bowel/bladder control?     no 10. OTHER SYMPTOMS: Do you have any other symptoms? (e.g., fever, abdomen pain, burning with urination, blood in urine)       no  Protocols used: Back Pain-A-AH

## 2024-03-15 ENCOUNTER — Ambulatory Visit

## 2024-03-15 DIAGNOSIS — E538 Deficiency of other specified B group vitamins: Secondary | ICD-10-CM

## 2024-03-15 MED ORDER — CYANOCOBALAMIN 1000 MCG/ML IJ SOLN
1000.0000 ug | Freq: Once | INTRAMUSCULAR | Status: AC
  Administered 2024-03-15: 14:00:00 1000 ug via INTRAMUSCULAR

## 2024-03-15 NOTE — Progress Notes (Signed)
 Pt presented for their vitamin B12 injection. Pt was identified through two identifiers. Pt tolerated shot well in their left deltoid.

## 2024-03-18 ENCOUNTER — Ambulatory Visit (INDEPENDENT_AMBULATORY_CARE_PROVIDER_SITE_OTHER): Admitting: Family

## 2024-03-18 ENCOUNTER — Encounter: Payer: Self-pay | Admitting: Family

## 2024-03-18 VITALS — BP 130/72 | HR 83 | Temp 98.3°F | Ht 68.0 in | Wt 192.6 lb

## 2024-03-18 DIAGNOSIS — R051 Acute cough: Secondary | ICD-10-CM

## 2024-03-18 DIAGNOSIS — J029 Acute pharyngitis, unspecified: Secondary | ICD-10-CM | POA: Diagnosis not present

## 2024-03-18 DIAGNOSIS — M5442 Lumbago with sciatica, left side: Secondary | ICD-10-CM

## 2024-03-18 DIAGNOSIS — M5441 Lumbago with sciatica, right side: Secondary | ICD-10-CM | POA: Diagnosis not present

## 2024-03-18 DIAGNOSIS — G8929 Other chronic pain: Secondary | ICD-10-CM | POA: Diagnosis not present

## 2024-03-18 DIAGNOSIS — M549 Dorsalgia, unspecified: Secondary | ICD-10-CM | POA: Insufficient documentation

## 2024-03-18 LAB — POC COVID19 BINAXNOW: SARS Coronavirus 2 Ag: NEGATIVE

## 2024-03-18 LAB — POCT INFLUENZA A/B
Influenza A, POC: NEGATIVE
Influenza B, POC: NEGATIVE

## 2024-03-18 MED ORDER — GABAPENTIN 100 MG PO CAPS: 100.0000 mg | ORAL_CAPSULE | Freq: Every day | ORAL | 3 refills | Status: AC

## 2024-03-18 NOTE — Progress Notes (Unsigned)
 Assessment & Plan:  There are no diagnoses linked to this encounter.   Return precautions given.   Risks, benefits, and alternatives of the medications and treatment plan prescribed today were discussed, and patient expressed understanding.   Education regarding symptom management and diagnosis given to patient on AVS either electronically or printed.  No follow-ups on file.  Rollene Northern, FNP  Subjective:    Patient ID: Anna [name], female    DOB: 06/23/1945, 78 y.o.   MRN: [id number]  CC: Krisy Dix is a 78 y.o. female who presents today for an acute visit.    HPI: Complains of BL sore throat x 1 day Mild HA, thin congestion runny nose Occasional dry cough Denies sinus pain, fever, chills,   No sick contacts  Has using umcka cold medicine  She complains of right low back pain x  3 weeks. Worse when goes from sitting to standing. Bending causes pain Using a cane  Endorses right leg pain    numbness in BL legs from knees to ankle for over a year.  She has been seen by [facility], Dr Leora   No trouble urinating, groin pain, fecal incontinence   S/p hip injection 01/16/24 with relief; groin pain has improved.  PT has helped.  She has right hip xray.  She is taking tylenol  1000mg  BID. She is taking hydrocodone , robaxin, tramadol  50mg  as prescribed orthopedic.  She does not take tramadol  or hydrocodone  at the same.    H/o BCC H/o WPW, HTN, IBS, OSA   Colon resection 04/07/2023, complicated by abdominal DVT, treated with lovenox  and then transitioned to eliquis; completed eliquis and following with [facility] hematology   Never smoker  B12 468   Allergies: Iodinated contrast media, Lovenox  [enoxaparin ], Alpha-gal, Gluten meal, Latex, Milk-related compounds, Shellfish allergy , Sulfa antibiotics, Trazodone , Azithromycin, Shellfish protein-containing drug products, and Wheat Medications Ordered Prior to Encounter[1]  Review of Systems   Constitutional:  Negative for chills and fever.  Respiratory:  Negative for cough.   Cardiovascular:  Negative for chest pain and palpitations.  Gastrointestinal:  Negative for nausea and vomiting.      Objective:    BP 130/72   Pulse 83   Temp 98.3 F (36.8 C) (Oral)   Ht 5' 8 (1.727 m)   Wt 192 lb 9.6 oz (87.4 kg)   SpO2 99%   BMI 29.28 kg/m   BP Readings from Last 3 Encounters:  03/18/24 130/72  02/18/24 136/85  12/12/23 128/78   Wt Readings from Last 3 Encounters:  03/18/24 192 lb 9.6 oz (87.4 kg)  12/12/23 188 lb 12.8 oz (85.6 kg)  11/14/23 186 lb 3.2 oz (84.5 kg)    Physical Exam Vitals reviewed.  Constitutional:      Appearance: She is well-developed.  HENT:     Head: Normocephalic and atraumatic.     Right Ear: Hearing, tympanic membrane, ear canal and external ear normal. No decreased hearing noted. No drainage, swelling or tenderness. No middle ear effusion. No foreign body. Tympanic membrane is not erythematous or bulging.     Left Ear: Hearing, tympanic membrane, ear canal and external ear normal. No decreased hearing noted. No drainage, swelling or tenderness.  No middle ear effusion. No foreign body. Tympanic membrane is not erythematous or bulging.     Nose: Nose normal. No rhinorrhea.     Right Sinus: No maxillary sinus tenderness or frontal sinus tenderness.     Left Sinus: No maxillary sinus tenderness or frontal sinus  tenderness.     Mouth/Throat:     Pharynx: Uvula midline. No oropharyngeal exudate or posterior oropharyngeal erythema.     Tonsils: No tonsillar abscesses.  Eyes:     Conjunctiva/sclera: Conjunctivae normal.  Cardiovascular:     Rate and Rhythm: Normal rate and regular rhythm.     Pulses: Normal pulses.     Heart sounds: Normal heart sounds.  Pulmonary:     Effort: Pulmonary effort is normal.     Breath sounds: Normal breath sounds. No wheezing, rhonchi or rales.  Musculoskeletal:     Lumbar back: No swelling, edema, spasms,  tenderness or bony tenderness. Normal range of motion.     Comments: Full range of motion with flexion, tension, lateral side bends. No bony tenderness. No pain, numbness, tingling elicited with single leg raise bilaterally.   Lymphadenopathy:     Head:     Right side of head: No submental, submandibular, tonsillar, preauricular, posterior auricular or occipital adenopathy.     Left side of head: No submental, submandibular, tonsillar, preauricular, posterior auricular or occipital adenopathy.     Cervical: No cervical adenopathy.  Skin:    General: Skin is warm and dry.  Neurological:     Mental Status: She is alert.     Sensory: No sensory deficit.     Deep Tendon Reflexes:     Reflex Scores:      Patellar reflexes are 2+ on the right side and 2+ on the left side.    Comments: Sensation and strength intact bilateral lower extremities.  Psychiatric:        Speech: Speech normal.        Behavior: Behavior normal.        Thought Content: Thought content normal.          [1]  Current Outpatient Medications on File Prior to Visit  Medication Sig Dispense Refill   acetaminophen  (TYLENOL ) 500 MG tablet Take 1,000 mg by mouth every 6 (six) hours as needed for moderate pain.     albuterol  (VENTOLIN  HFA) 108 (90 Base) MCG/ACT inhaler Inhale 1-2 puffs into the lungs every 4 (four) hours as needed for wheezing or shortness of breath. 8 g 2   ALPRAZolam  (XANAX ) 0.5 MG tablet Take 1 tablet (0.5 mg total) by mouth 2 (two) times daily as needed for anxiety or sleep. 45 tablet 3   azelastine  (ASTELIN ) 0.1 % nasal spray Place 1 spray into the nose 2 (two) times daily.     betamethasone  valerate ointment (VALISONE ) 0.1 % Apply 1 Application topically 2 (two) times daily. 30 g 0   diphenhydrAMINE  (BENADRYL ) 12.5 MG/5ML liquid Take 37.5 mg by mouth 4 (four) times daily as needed for allergies.     [Paused] ELIQUIS 5 MG TABS tablet Take 5 mg by mouth 2 (two) times daily.     EPINEPHrine  0.3 mg/0.3 mL  IJ SOAJ injection Use as directed for severe allergic reaction 2 each 2   ergocalciferol  (DRISDOL ) 1.25 MG (50000 UT) capsule Take 1 capsule (50,000 Units total) by mouth once a week. (Patient not taking: Reported on 02/18/2024) 12 capsule 3   estradiol  (ESTRACE ) 0.1 MG/GM vaginal cream Apply a pea-sized amount around the opening of the urethra 3 times weekly. 42.5 g 3   ferrous sulfate  325 (65 FE) MG tablet TAKE 1 TABLET BY MOUTH EVERY DAY WITH FOOD 90 tablet 0   hydrocortisone  (ANUSOL -HC) 2.5 % rectal cream Place 1 Application rectally 4 (four) times daily. 30 g 1  hydrocortisone  (ANUSOL -HC) 25 MG suppository Place 1 suppository (25 mg total) rectally 2 (two) times daily as needed for hemorrhoids or anal itching. 30 suppository 5   [Paused] losartan -hydrochlorothiazide (HYZAAR) 50-12.5 MG tablet Take 1 tablet by mouth daily. 90 tablet 1   methocarbamol (ROBAXIN) 500 MG tablet Take 500 mg by mouth at bedtime.     ondansetron  (ZOFRAN -ODT) 4 MG disintegrating tablet Take 1 tablet (4 mg total) by mouth every 8 (eight) hours as needed for nausea or vomiting. 20 tablet 0   pantoprazole  (PROTONIX ) 40 MG tablet Take 1 tablet (40 mg total) by mouth daily. 90 tablet 1   rosuvastatin  (CRESTOR ) 20 MG tablet TAKE 1 TABLET EVERY OTHER DAY (Patient not taking: Reported on 02/18/2024) 45 tablet 3   sodium chloride  (OCEAN) 0.65 % SOLN nasal spray Place 1 spray into both nostrils as needed (dryness).     traMADol  (ULTRAM ) 50 MG tablet Take 50 mg by mouth every 6 (six) hours as needed.     Trospium Chloride 60 MG CP24 Take 1 capsule (60 mg total) by mouth daily. (Patient not taking: Reported on 11/14/2023) 30 capsule 11   No current facility-administered medications on file prior to visit.

## 2024-03-18 NOTE — Patient Instructions (Signed)
 We will start gabapentin  100 mg in the morning.  As discussed this medication can be sedating.  Do not take at the same time as tramadol  or oxycodone .  Do not take oxycodone  or tramadol  at the same time.  Robaxin is a muscle relaxant and this is sedating as well.  Do not take this at the same time as a pain medication or at the same time as gabapentin .  Again, I worry about being overly sedated.   We can certainly titrate gabapentin  if you find this helpful.  I ordered an MRI and placed a referral to neurology.  Please keep your appointment with [facility] clinic orthopedics.

## 2024-03-19 ENCOUNTER — Encounter: Payer: Self-pay | Admitting: Family

## 2024-03-19 DIAGNOSIS — J029 Acute pharyngitis, unspecified: Secondary | ICD-10-CM | POA: Insufficient documentation

## 2024-03-19 NOTE — Assessment & Plan Note (Addendum)
 Differential includes DDD with radicular symptoms.  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  Referral to neurology.  Pending MRI lumbar spine.  Trial gabapentin  100 mg every morning and titrate.  Counseled gabapentin  being sedating.  As she is prescribed tramadol  and oxycodone  from orthopedics, she is very well aware not to take gabapentin  at the same time with either tramadol  or oxycodone .  She does not take tramadol  or hydrocodone  at the same time

## 2024-03-19 NOTE — Assessment & Plan Note (Signed)
 No systemic features.  Duration 1 day.  Negative COVID, flu.  Advised supportive care ,salt water gargles.  She will let me know if symptoms persist

## 2024-03-24 ENCOUNTER — Ambulatory Visit

## 2024-03-26 ENCOUNTER — Other Ambulatory Visit: Payer: Self-pay | Admitting: Nurse Practitioner

## 2024-03-26 ENCOUNTER — Telehealth: Payer: Self-pay

## 2024-03-26 ENCOUNTER — Ambulatory Visit: Payer: Self-pay

## 2024-03-26 DIAGNOSIS — Z91041 Radiographic dye allergy status: Secondary | ICD-10-CM

## 2024-03-26 MED ORDER — PREDNISONE 50 MG PO TABS: ORAL_TABLET | ORAL | 0 refills | Status: DC

## 2024-03-26 MED ORDER — DIPHENHYDRAMINE HCL 50 MG PO TABS: 50.0000 mg | ORAL_TABLET | Freq: Once | ORAL | 0 refills | Status: DC

## 2024-03-26 NOTE — Telephone Encounter (Signed)
 FYI Only or Action Required?: Action required by provider: clinical question for provider. Requesting to have Hip MRI at same time as lumbar MRI.   Patient was last seen in primary care on 03/18/2024 by Anna Anna MATSU, FNP.  Called Nurse Triage reporting Back Pain.  Symptoms began several months ago.  Interventions attempted: Prescription medications: pain medications and Rest, hydration, or home remedies.  Symptoms are: unchanged.  Triage Disposition: Call PCP Now  Patient/caregiver understands and will follow disposition?: Yes  Reason for Disposition  [1] Caller has URGENT question (includes prescribed medication questions) AND [2] triager unable to answer question  Answer Assessment - Initial Assessment Questions Patient states that she was seen in office on 03/18/24 for lower back pain. She states that she had right hip pain that moved to her right lower back and makes it difficult for her to perform her normal activities. She states that the pain gets up to 7-8/10. She is currently unable to work. She also reports numbness in bilateral lower legs. She has been taking prescribed medications, states that they were previously helping ,but now not as much. Patient has MRI of lumbar spine scheduled for Monday 12/29, but is requesting to have MRI of right hip performed at the same time.   1. MAIN CONCERN OR SYMPTOM:  What is your main concern right now? What question do you have? What's the main symptom you're worried about? (e.g., breathing difficulty, cough, fever, pain)     Would like to have MRI done on hip at the same time as back MRI on Monday 12/29  2. ONSET: When did the  symptoms  start?     Months ago  3. BETTER-SAME-WORSE: Are you getting better, staying the same, or getting worse compared to how you felt at your last visit to the doctor (most recent medical visit)?     Same  4. VISIT DATE: When were you seen? (e.g., date)     03/18/24  5. VISIT DOCTOR:  What is the name of the doctor taking care of you now?     Anna Dineen, FNP  6. VISIT DIAGNOSIS:  What was the main symptom or problem that you were seen for? Were you given a diagnosis?      Chronic lower back pain  7. VISIT MEDICINES: Did the doctor order any new medicines for you to use? If Yes, ask: Have you filled the prescription and started taking the medicine?      Gabapentin   8. NEXT APPOINTMENT: Have you scheduled a follow-up appointment with your doctor?     NA  9. PAIN: Is there any pain? If Yes, ask: How bad is it?  (Scale 0-10; or none, mild, moderate, severe)     Yes, 10-06-08 in right lower back  10. FEVER: Do you have a fever? If Yes, ask: What is it, how was it measured  and when did it start?       No  11. OTHER SYMPTOMS: Do you have any other symptoms?     Numbness from knees down  12. PREGNANCY: Is there any chance you are pregnant? When was your last menstrual period?       NA  Protocols used: Recent Medical Visit for Illness Follow-up Call-A-AH  Copied from CRM #[id number]. Topic: Clinical - Red Word Triage >> Mar 26, 2024  3:00 PM Jayma L wrote: Red Word that prompted transfer to Nurse Triage: pain in hip getting worse on right side, back pain as well runs down  leg

## 2024-03-26 NOTE — Telephone Encounter (Signed)
 Copied from CRM [id number]. Topic: Clinical - Medication Question >> Mar 26, 2024 10:32 AM Jayma L wrote: Reason for CRM: patient called in stated she is having a MRI on Monday at 515pm and she's allergic to the contact, patient asking for meds to help with it. Please call her back at [phone]

## 2024-03-26 NOTE — Telephone Encounter (Signed)
 Patient notified and verbalized understanding and has no further questions at this time.  ?

## 2024-03-26 NOTE — Telephone Encounter (Unsigned)
 Copied from CRM #[id number]. Topic: General - Other >> Mar 26, 2024 10:46 AM Berneda FALCON wrote: Reason for CRM: Patient states the name of the medication that she was looking for previously because she has MRI on Monday and is allergic to contrast was   Prednisone -take 1 tab 50MG  by mouth 13 hours, 7 hours, and 1 hour before contrast.  Second medication-banophen -2 capsules 50MG  by mouth 1 hour prior to contrast.  She would prefer those to go to: [id number] [address], [facility] - [address] [place] [address] Phone: [phone] Fax: [phone] Hours: Not open 24 hours

## 2024-03-29 ENCOUNTER — Ambulatory Visit
Admission: RE | Admit: 2024-03-29 | Discharge: 2024-03-29 | Disposition: A | Discharge status: Home / Self Care | Source: Ambulatory Visit | Attending: Family | Admitting: Family

## 2024-03-29 DIAGNOSIS — G8929 Other chronic pain: Secondary | ICD-10-CM | POA: Diagnosis present

## 2024-03-29 DIAGNOSIS — M5442 Lumbago with sciatica, left side: Secondary | ICD-10-CM | POA: Diagnosis present

## 2024-03-29 DIAGNOSIS — M5441 Lumbago with sciatica, right side: Secondary | ICD-10-CM | POA: Diagnosis present

## 2024-03-31 ENCOUNTER — Other Ambulatory Visit: Payer: Self-pay | Admitting: Internal Medicine

## 2024-03-31 DIAGNOSIS — N390 Urinary tract infection, site not specified: Secondary | ICD-10-CM

## 2024-03-31 NOTE — Telephone Encounter (Signed)
 Noted

## 2024-03-31 NOTE — Telephone Encounter (Signed)
 Called Patient and she states that she has already had an x ray of her hip at [facility]. Patient is agreeable to keep the 2/26 ortho appointment.

## 2024-04-07 ENCOUNTER — Telehealth: Payer: Self-pay

## 2024-04-07 NOTE — Telephone Encounter (Signed)
 Copied from CRM #[id number]. Topic: General - Other >> Apr 07, 2024  3:02 PM Mesmerise C wrote: Reason for CRM: Patient got an MRI on 12/29 and reached out to the imaging center for the results stated it's best to contact clinic to expedite the process for the results due to being over a week would like a call back once results are in

## 2024-04-08 ENCOUNTER — Ambulatory Visit: Payer: Self-pay | Admitting: Family

## 2024-04-08 DIAGNOSIS — G8929 Other chronic pain: Secondary | ICD-10-CM

## 2024-04-08 NOTE — Telephone Encounter (Signed)
 Spoke with reading and they will have this expedited this evening.

## 2024-04-09 ENCOUNTER — Other Ambulatory Visit: Payer: Self-pay | Admitting: Family

## 2024-04-09 DIAGNOSIS — G8929 Other chronic pain: Secondary | ICD-10-CM

## 2024-04-09 NOTE — Telephone Encounter (Signed)
 Call  Dr Hooten at [facility]   Patient needs sooner appointment for severe low back pain, likely she will need epidural guided injection.  Currently scheduled for February however she is in a lot of pain   see abnormal MRI lumbar 03/29/24  Let me know once scheduled.  Let patient know as well

## 2024-04-09 NOTE — Telephone Encounter (Signed)
 Noted

## 2024-04-12 NOTE — Telephone Encounter (Signed)
 Copied from CRM #[id number]. Topic: Clinical - Lab/Test Results >> Apr 12, 2024  8:48 AM Zy'onna H wrote: Reason for CRM: Patient daughter called in on behalf of the patient urgently requesting  Bilat. MRI - Hips She stated the patient has had pain, discomfort, limited mobility, and unable to work due to issues.   She will begin seeing a orthopedic in February, and is requesting the MRI be completed prior to appt. 05/04/2024

## 2024-04-13 NOTE — Telephone Encounter (Signed)
 Pt has been notified.

## 2024-04-16 ENCOUNTER — Ambulatory Visit
Admission: RE | Admit: 2024-04-16 | Discharge: 2024-04-16 | Disposition: A | Discharge status: Home / Self Care | Source: Ambulatory Visit | Attending: Family | Admitting: Family

## 2024-04-16 DIAGNOSIS — G8929 Other chronic pain: Secondary | ICD-10-CM | POA: Diagnosis present

## 2024-04-16 DIAGNOSIS — M5441 Lumbago with sciatica, right side: Secondary | ICD-10-CM | POA: Insufficient documentation

## 2024-04-16 DIAGNOSIS — M5442 Lumbago with sciatica, left side: Secondary | ICD-10-CM | POA: Insufficient documentation

## 2024-04-18 ENCOUNTER — Ambulatory Visit: Payer: Self-pay | Admitting: Family

## 2024-04-21 ENCOUNTER — Other Ambulatory Visit: Payer: Self-pay | Admitting: Family

## 2024-04-21 NOTE — Telephone Encounter (Signed)
 Called pt, I offered her the number to the imaging center but she decline she stated she wants the paperwork faxed over to Dr Mardee office. Pt stated she has no history of addiction to opioid or seizure.  She is taking Xanax  prescribed by Dr Marylynn. Pt doesn't drink alcohol also hasn't been sedated. I counseled pt on risk of sedation and overdose. I reminded pt Refills will need to come from orthopedic or physiatry

## 2024-04-22 ENCOUNTER — Other Ambulatory Visit: Payer: Self-pay | Admitting: Family

## 2024-04-22 ENCOUNTER — Ambulatory Visit

## 2024-04-22 DIAGNOSIS — E538 Deficiency of other specified B group vitamins: Secondary | ICD-10-CM

## 2024-04-22 DIAGNOSIS — G8929 Other chronic pain: Secondary | ICD-10-CM

## 2024-04-22 MED ORDER — TRAMADOL HCL 50 MG PO TABS: 50.0000 mg | ORAL_TABLET | ORAL | 0 refills | Status: AC | PRN

## 2024-04-22 MED ORDER — HYDROCODONE-ACETAMINOPHEN 5-325 MG PO TABS: 1.0000 | ORAL_TABLET | Freq: Four times a day (QID) | ORAL | 0 refills | Status: AC | PRN

## 2024-04-22 MED ORDER — CYANOCOBALAMIN 1000 MCG/ML IJ SOLN
1000.0000 ug | Freq: Once | INTRAMUSCULAR | Status: AC
  Administered 2024-04-22: 1000 ug via INTRAMUSCULAR

## 2024-04-22 NOTE — Progress Notes (Signed)
 Patient presented for B 12 injection to right deltoid, patient voiced no concerns nor showed any signs of distress during injection.

## 2024-04-28 ENCOUNTER — Other Ambulatory Visit: Payer: Self-pay | Admitting: Internal Medicine

## 2024-04-29 ENCOUNTER — Telehealth: Payer: Self-pay | Admitting: Internal Medicine

## 2024-04-29 NOTE — Telephone Encounter (Signed)
 Noted.

## 2024-04-29 NOTE — Telephone Encounter (Signed)
 Requesting: Alprazolam  Contract: No UDS: no Last Visit: 12/12/2023 Next Visit: 06/11/2024 Last Refill: 11/14/2023  Please Advise    Spoke with pt and she will come by office today to sign controlled substance agreement.

## 2024-04-29 NOTE — Telephone Encounter (Signed)
 Non- opioid substance agreement has been scanned into the chart under release of information.

## 2024-05-24 ENCOUNTER — Ambulatory Visit

## 2024-06-11 ENCOUNTER — Ambulatory Visit: Admitting: Internal Medicine

## 2024-09-06 ENCOUNTER — Ambulatory Visit
